# Patient Record
Sex: Female | Born: 1960 | Race: Black or African American | Hispanic: No | Smoking: Current every day smoker
Health system: Southern US, Community
[De-identification: ages and names within clinical notes are randomized; demographics above are authoritative.]

## PROBLEM LIST (undated history)

## (undated) DIAGNOSIS — F319 Bipolar disorder, unspecified: Secondary | ICD-10-CM

## (undated) DIAGNOSIS — E669 Obesity, unspecified: Secondary | ICD-10-CM

## (undated) DIAGNOSIS — M1990 Unspecified osteoarthritis, unspecified site: Secondary | ICD-10-CM

## (undated) DIAGNOSIS — F32A Depression, unspecified: Secondary | ICD-10-CM

## (undated) DIAGNOSIS — J309 Allergic rhinitis, unspecified: Secondary | ICD-10-CM

## (undated) DIAGNOSIS — F419 Anxiety disorder, unspecified: Secondary | ICD-10-CM

## (undated) DIAGNOSIS — E785 Hyperlipidemia, unspecified: Secondary | ICD-10-CM

## (undated) DIAGNOSIS — I10 Essential (primary) hypertension: Secondary | ICD-10-CM

## (undated) DIAGNOSIS — K219 Gastro-esophageal reflux disease without esophagitis: Secondary | ICD-10-CM

## (undated) DIAGNOSIS — D649 Anemia, unspecified: Secondary | ICD-10-CM

## (undated) DIAGNOSIS — F329 Major depressive disorder, single episode, unspecified: Secondary | ICD-10-CM

## (undated) HISTORY — DX: Allergic rhinitis, unspecified: J30.9

## (undated) HISTORY — DX: Unspecified osteoarthritis, unspecified site: M19.90

## (undated) HISTORY — DX: Anemia, unspecified: D64.9

## (undated) HISTORY — PX: Z-PLASTY REPAIR: SHX2678

## (undated) HISTORY — DX: Hyperlipidemia, unspecified: E78.5

## (undated) HISTORY — DX: Gastro-esophageal reflux disease without esophagitis: K21.9

## (undated) HISTORY — DX: Obesity, unspecified: E66.9

## (undated) HISTORY — PX: WRIST SURGERY: SHX841

## (undated) HISTORY — PX: ABDOMINAL HYSTERECTOMY: SUR658

---

## 2004-10-24 ENCOUNTER — Ambulatory Visit: Payer: Self-pay | Admitting: Family Medicine

## 2006-06-29 ENCOUNTER — Emergency Department (HOSPITAL_COMMUNITY): Admission: EM | Admit: 2006-06-29 | Discharge: 2006-06-29 | Payer: Self-pay | Admitting: Emergency Medicine

## 2007-05-22 ENCOUNTER — Ambulatory Visit (HOSPITAL_COMMUNITY): Admission: RE | Admit: 2007-05-22 | Discharge: 2007-05-22 | Payer: Self-pay | Admitting: Family Medicine

## 2007-10-01 ENCOUNTER — Encounter: Payer: Self-pay | Admitting: Family Medicine

## 2008-02-12 ENCOUNTER — Emergency Department (HOSPITAL_COMMUNITY): Admission: EM | Admit: 2008-02-12 | Discharge: 2008-02-12 | Payer: Self-pay | Admitting: Emergency Medicine

## 2009-02-12 ENCOUNTER — Emergency Department (HOSPITAL_COMMUNITY): Admission: EM | Admit: 2009-02-12 | Discharge: 2009-02-12 | Payer: Self-pay | Admitting: Emergency Medicine

## 2009-02-24 ENCOUNTER — Ambulatory Visit (HOSPITAL_COMMUNITY): Admission: RE | Admit: 2009-02-24 | Discharge: 2009-02-24 | Payer: Self-pay | Admitting: Emergency Medicine

## 2009-02-24 ENCOUNTER — Emergency Department (HOSPITAL_COMMUNITY): Admission: EM | Admit: 2009-02-24 | Discharge: 2009-02-24 | Payer: Self-pay | Admitting: Emergency Medicine

## 2009-03-20 ENCOUNTER — Emergency Department (HOSPITAL_COMMUNITY): Admission: EM | Admit: 2009-03-20 | Discharge: 2009-03-21 | Payer: Self-pay | Admitting: Emergency Medicine

## 2009-08-09 ENCOUNTER — Emergency Department (HOSPITAL_COMMUNITY): Admission: EM | Admit: 2009-08-09 | Discharge: 2009-08-10 | Payer: Self-pay | Admitting: Emergency Medicine

## 2009-08-10 ENCOUNTER — Ambulatory Visit: Payer: Self-pay | Admitting: Psychiatry

## 2009-08-10 ENCOUNTER — Inpatient Hospital Stay (HOSPITAL_COMMUNITY): Admission: AD | Admit: 2009-08-10 | Discharge: 2009-08-16 | Payer: Self-pay | Admitting: Psychiatry

## 2009-08-28 ENCOUNTER — Encounter (INDEPENDENT_AMBULATORY_CARE_PROVIDER_SITE_OTHER): Payer: Self-pay | Admitting: Emergency Medicine

## 2009-08-28 ENCOUNTER — Ambulatory Visit: Payer: Self-pay | Admitting: Vascular Surgery

## 2009-08-28 ENCOUNTER — Emergency Department (HOSPITAL_COMMUNITY): Admission: EM | Admit: 2009-08-28 | Discharge: 2009-08-28 | Payer: Self-pay | Admitting: Emergency Medicine

## 2009-09-19 ENCOUNTER — Emergency Department (HOSPITAL_COMMUNITY): Admission: EM | Admit: 2009-09-19 | Discharge: 2009-09-19 | Payer: Self-pay | Admitting: Emergency Medicine

## 2009-12-27 ENCOUNTER — Ambulatory Visit: Payer: Self-pay | Admitting: Internal Medicine

## 2009-12-27 ENCOUNTER — Encounter (INDEPENDENT_AMBULATORY_CARE_PROVIDER_SITE_OTHER): Payer: Self-pay | Admitting: Family Medicine

## 2009-12-27 LAB — CONVERTED CEMR LAB
Barbiturate Quant, Ur: NEGATIVE
Creatinine,U: 162.1 mg/dL
Marijuana Metabolite: NEGATIVE
Methadone: NEGATIVE
Opiate Screen, Urine: NEGATIVE
Phencyclidine (PCP): NEGATIVE
Propoxyphene: NEGATIVE

## 2010-02-19 ENCOUNTER — Ambulatory Visit: Payer: Self-pay | Admitting: Internal Medicine

## 2010-02-19 LAB — CONVERTED CEMR LAB
Alkaline Phosphatase: 45 units/L (ref 39–117)
Basophils Absolute: 0 10*3/uL (ref 0.0–0.1)
Basophils Relative: 1 % (ref 0–1)
CO2: 19 meq/L (ref 19–32)
Cholesterol: 230 mg/dL — ABNORMAL HIGH (ref 0–200)
Creatinine, Ser: 1.44 mg/dL — ABNORMAL HIGH (ref 0.40–1.20)
Eosinophils Absolute: 0.3 10*3/uL (ref 0.0–0.7)
Glucose, Bld: 107 mg/dL — ABNORMAL HIGH (ref 70–99)
MCHC: 34.8 g/dL (ref 30.0–36.0)
MCV: 79.8 fL (ref 78.0–100.0)
Monocytes Absolute: 0.6 10*3/uL (ref 0.1–1.0)
Monocytes Relative: 10 % (ref 3–12)
Neutrophils Relative %: 63 % (ref 43–77)
RBC: 4.71 M/uL (ref 3.87–5.11)
RDW: 14.5 % (ref 11.5–15.5)
Total Bilirubin: 0.5 mg/dL (ref 0.3–1.2)
Total CHOL/HDL Ratio: 4.8
Triglycerides: 130 mg/dL (ref <150)
VLDL: 26 mg/dL (ref 0–40)

## 2010-02-21 ENCOUNTER — Encounter (INDEPENDENT_AMBULATORY_CARE_PROVIDER_SITE_OTHER): Payer: Self-pay | Admitting: Internal Medicine

## 2010-10-30 NOTE — Letter (Signed)
Summary: rpc chart  rpc chart   Imported By: Curtis Sites 07/12/2010 15:53:43  _____________________________________________________________________  External Attachment:    Type:   Image     Comment:   External Document

## 2011-01-02 LAB — URINALYSIS, ROUTINE W REFLEX MICROSCOPIC
Glucose, UA: NEGATIVE mg/dL
Hgb urine dipstick: NEGATIVE
Protein, ur: NEGATIVE mg/dL

## 2011-01-02 LAB — CBC
HCT: 37.1 % (ref 36.0–46.0)
Hemoglobin: 12.6 g/dL (ref 12.0–15.0)
MCHC: 34.4 g/dL (ref 30.0–36.0)
MCV: 86.6 fL (ref 78.0–100.0)
Platelets: 230 10*3/uL (ref 150–400)
RBC: 4.28 MIL/uL (ref 3.87–5.11)
RDW: 14.6 % (ref 11.5–15.5)
WBC: 6 10*3/uL (ref 4.0–10.5)

## 2011-01-02 LAB — HEPATIC FUNCTION PANEL
AST: 21 U/L (ref 0–37)
Albumin: 3.3 g/dL — ABNORMAL LOW (ref 3.5–5.2)

## 2011-01-02 LAB — POCT I-STAT, CHEM 8
BUN: 12 mg/dL (ref 6–23)
Calcium, Ion: 1.24 mmol/L (ref 1.12–1.32)
Creatinine, Ser: 0.9 mg/dL (ref 0.4–1.2)
Glucose, Bld: 86 mg/dL (ref 70–99)
TCO2: 23 mmol/L (ref 0–100)

## 2011-01-02 LAB — RAPID URINE DRUG SCREEN, HOSP PERFORMED
Amphetamines: NOT DETECTED
Barbiturates: NOT DETECTED
Benzodiazepines: NOT DETECTED
Opiates: NOT DETECTED

## 2011-01-02 LAB — BASIC METABOLIC PANEL
CO2: 21 mEq/L (ref 19–32)
Calcium: 9.6 mg/dL (ref 8.4–10.5)
Creatinine, Ser: 1.11 mg/dL (ref 0.4–1.2)
GFR calc Af Amer: >60 mL/min (ref >60)
Glucose, Bld: 105 mg/dL — ABNORMAL HIGH (ref 70–99)

## 2011-01-02 LAB — DIFFERENTIAL
Basophils Absolute: 0 10*3/uL (ref 0.0–0.1)
Basophils Relative: 1 % (ref 0–1)
Eosinophils Absolute: 0.4 10*3/uL (ref 0.0–0.7)
Eosinophils Relative: 6 % — ABNORMAL HIGH (ref 0–5)
Lymphs Abs: 2.2 10*3/uL (ref 0.7–4.0)
Monocytes Absolute: 0.7 10*3/uL (ref 0.1–1.0)
Monocytes Relative: 11 % (ref 3–12)
Neutro Abs: 2.8 10*3/uL (ref 1.7–7.7)
Neutrophils Relative %: 47 % (ref 43–77)
Neutrophils Relative %: 48 % (ref 43–77)

## 2011-01-07 LAB — BASIC METABOLIC PANEL
CO2: 23 mEq/L (ref 19–32)
GFR calc non Af Amer: 43 mL/min — ABNORMAL LOW (ref >60)
Glucose, Bld: 107 mg/dL — ABNORMAL HIGH (ref 70–99)
Potassium: 3.3 mEq/L — ABNORMAL LOW (ref 3.5–5.1)
Sodium: 139 mEq/L (ref 135–145)

## 2011-01-07 LAB — DIFFERENTIAL
Basophils Absolute: 0 K/uL (ref 0.0–0.1)
Basophils Relative: 0 % (ref 0–1)
Eosinophils Absolute: 0.1 K/uL (ref 0.0–0.7)
Eosinophils Relative: 2 % (ref 0–5)
Lymphocytes Relative: 12 % (ref 12–46)
Lymphs Abs: 0.7 K/uL (ref 0.7–4.0)
Monocytes Absolute: 0.4 K/uL (ref 0.1–1.0)
Monocytes Relative: 6 % (ref 3–12)
Neutro Abs: 4.7 K/uL (ref 1.7–7.7)
Neutrophils Relative %: 79 % — ABNORMAL HIGH (ref 43–77)

## 2011-01-07 LAB — URINALYSIS, ROUTINE W REFLEX MICROSCOPIC
Bilirubin Urine: NEGATIVE
Glucose, UA: NEGATIVE mg/dL
Hgb urine dipstick: NEGATIVE
Specific Gravity, Urine: 1.015 (ref 1.005–1.030)
Urobilinogen, UA: 0.2 mg/dL (ref 0.0–1.0)

## 2011-01-07 LAB — CBC
HCT: 33.9 % — ABNORMAL LOW (ref 36.0–46.0)
Hemoglobin: 12 g/dL (ref 12.0–15.0)
MCHC: 35.3 g/dL (ref 30.0–36.0)
RBC: 3.98 MIL/uL (ref 3.87–5.11)
RDW: 14.3 % (ref 11.5–15.5)

## 2011-01-07 LAB — PREGNANCY, URINE: Preg Test, Ur: NEGATIVE

## 2011-01-08 LAB — CBC
HCT: 34.2 % — ABNORMAL LOW (ref 36.0–46.0)
HCT: 34.8 % — ABNORMAL LOW (ref 36.0–46.0)
Hemoglobin: 12.4 g/dL (ref 12.0–15.0)
Hemoglobin: 12.4 g/dL (ref 12.0–15.0)
MCHC: 36.1 g/dL — ABNORMAL HIGH (ref 30.0–36.0)
MCV: 84.2 fL (ref 78.0–100.0)
Platelets: 206 10*3/uL (ref 150–400)
RBC: 4.07 MIL/uL (ref 3.87–5.11)
WBC: 5.9 10*3/uL (ref 4.0–10.5)

## 2011-01-08 LAB — BASIC METABOLIC PANEL
CO2: 21 mEq/L (ref 19–32)
CO2: 22 mEq/L (ref 19–32)
Chloride: 112 mEq/L (ref 96–112)
Chloride: 112 mEq/L (ref 96–112)
Creatinine, Ser: 1.26 mg/dL — ABNORMAL HIGH (ref 0.4–1.2)
GFR calc Af Amer: 53 mL/min — ABNORMAL LOW (ref >60)
GFR calc Af Amer: 55 mL/min — ABNORMAL LOW (ref >60)
Potassium: 3.6 mEq/L (ref 3.5–5.1)
Potassium: 3.7 mEq/L (ref 3.5–5.1)
Sodium: 138 mEq/L (ref 135–145)

## 2011-01-08 LAB — URINALYSIS, ROUTINE W REFLEX MICROSCOPIC
Glucose, UA: NEGATIVE mg/dL
Hgb urine dipstick: NEGATIVE
Specific Gravity, Urine: 1.025 (ref 1.005–1.030)
pH: 6 (ref 5.0–8.0)

## 2011-01-08 LAB — DIFFERENTIAL
Basophils Relative: 1 % (ref 0–1)
Eosinophils Absolute: 0.3 10*3/uL (ref 0.0–0.7)
Eosinophils Relative: 4 % (ref 0–5)
Eosinophils Relative: 6 % — ABNORMAL HIGH (ref 0–5)
Lymphocytes Relative: 28 % (ref 12–46)
Lymphs Abs: 1.7 10*3/uL (ref 0.7–4.0)
Lymphs Abs: 1.7 10*3/uL (ref 0.7–4.0)
Monocytes Absolute: 0.7 10*3/uL (ref 0.1–1.0)
Monocytes Relative: 11 % (ref 3–12)
Neutro Abs: 3.3 10*3/uL (ref 1.7–7.7)
Neutrophils Relative %: 58 % (ref 43–77)

## 2011-01-26 ENCOUNTER — Emergency Department (HOSPITAL_COMMUNITY): Payer: Self-pay

## 2011-01-26 ENCOUNTER — Emergency Department (HOSPITAL_COMMUNITY)
Admission: EM | Admit: 2011-01-26 | Discharge: 2011-01-27 | Disposition: A | Discharge status: Home / Self Care | Payer: Self-pay | Attending: Emergency Medicine | Admitting: Emergency Medicine

## 2011-01-26 DIAGNOSIS — R51 Headache: Secondary | ICD-10-CM | POA: Insufficient documentation

## 2011-01-26 DIAGNOSIS — R197 Diarrhea, unspecified: Secondary | ICD-10-CM | POA: Insufficient documentation

## 2011-01-26 DIAGNOSIS — R109 Unspecified abdominal pain: Secondary | ICD-10-CM | POA: Insufficient documentation

## 2011-01-26 DIAGNOSIS — R112 Nausea with vomiting, unspecified: Secondary | ICD-10-CM | POA: Insufficient documentation

## 2011-01-26 LAB — URINE MICROSCOPIC-ADD ON

## 2011-01-26 LAB — POCT I-STAT, CHEM 8
BUN: 10 mg/dL (ref 6–23)
Calcium, Ion: 1.22 mmol/L (ref 1.12–1.32)
Chloride: 104 mEq/L (ref 96–112)
HCT: 44 % (ref 36.0–46.0)
Sodium: 139 mEq/L (ref 135–145)

## 2011-01-26 LAB — URINALYSIS, ROUTINE W REFLEX MICROSCOPIC
Bilirubin Urine: NEGATIVE
Glucose, UA: NEGATIVE mg/dL
Hgb urine dipstick: NEGATIVE
Ketones, ur: NEGATIVE mg/dL
Protein, ur: 30 mg/dL — AB
pH: 8 (ref 5.0–8.0)

## 2011-07-31 ENCOUNTER — Inpatient Hospital Stay (INDEPENDENT_AMBULATORY_CARE_PROVIDER_SITE_OTHER)
Admission: RE | Admit: 2011-07-31 | Discharge: 2011-07-31 | Disposition: A | Discharge status: Home / Self Care | Payer: Self-pay | Source: Ambulatory Visit | Attending: Family Medicine | Admitting: Family Medicine

## 2011-07-31 DIAGNOSIS — M545 Low back pain: Secondary | ICD-10-CM

## 2011-10-31 ENCOUNTER — Emergency Department (HOSPITAL_COMMUNITY): Payer: Self-pay

## 2011-10-31 ENCOUNTER — Other Ambulatory Visit: Payer: Self-pay

## 2011-10-31 ENCOUNTER — Emergency Department (HOSPITAL_COMMUNITY)
Admission: EM | Admit: 2011-10-31 | Discharge: 2011-10-31 | Disposition: A | Discharge status: Home / Self Care | Payer: Self-pay | Attending: Emergency Medicine | Admitting: Emergency Medicine

## 2011-10-31 ENCOUNTER — Encounter (HOSPITAL_COMMUNITY): Payer: Self-pay

## 2011-10-31 DIAGNOSIS — M7989 Other specified soft tissue disorders: Secondary | ICD-10-CM | POA: Insufficient documentation

## 2011-10-31 DIAGNOSIS — F341 Dysthymic disorder: Secondary | ICD-10-CM | POA: Insufficient documentation

## 2011-10-31 DIAGNOSIS — F17200 Nicotine dependence, unspecified, uncomplicated: Secondary | ICD-10-CM | POA: Insufficient documentation

## 2011-10-31 DIAGNOSIS — Z79899 Other long term (current) drug therapy: Secondary | ICD-10-CM | POA: Insufficient documentation

## 2011-10-31 DIAGNOSIS — F319 Bipolar disorder, unspecified: Secondary | ICD-10-CM | POA: Insufficient documentation

## 2011-10-31 DIAGNOSIS — R072 Precordial pain: Secondary | ICD-10-CM | POA: Insufficient documentation

## 2011-10-31 HISTORY — DX: Essential (primary) hypertension: I10

## 2011-10-31 HISTORY — DX: Anxiety disorder, unspecified: F41.9

## 2011-10-31 HISTORY — DX: Bipolar disorder, unspecified: F31.9

## 2011-10-31 HISTORY — DX: Depression, unspecified: F32.A

## 2011-10-31 HISTORY — DX: Major depressive disorder, single episode, unspecified: F32.9

## 2011-10-31 LAB — URINALYSIS, ROUTINE W REFLEX MICROSCOPIC
Glucose, UA: NEGATIVE mg/dL
Ketones, ur: NEGATIVE mg/dL
Leukocytes, UA: NEGATIVE
Nitrite: NEGATIVE
Protein, ur: NEGATIVE mg/dL
pH: 6.5 (ref 5.0–8.0)

## 2011-10-31 LAB — DIFFERENTIAL
Basophils Absolute: 0 10*3/uL (ref 0.0–0.1)
Basophils Relative: 0 % (ref 0–1)
Lymphocytes Relative: 29 % (ref 12–46)
Neutro Abs: 5 10*3/uL (ref 1.7–7.7)
Neutrophils Relative %: 61 % (ref 43–77)

## 2011-10-31 LAB — CBC
HCT: 37.7 % (ref 36.0–46.0)
Hemoglobin: 13.4 g/dL (ref 12.0–15.0)
MCHC: 35.5 g/dL (ref 30.0–36.0)
RDW: 14.8 % (ref 11.5–15.5)
WBC: 8.2 10*3/uL (ref 4.0–10.5)

## 2011-10-31 LAB — POCT I-STAT TROPONIN I: Troponin i, poc: 0 ng/mL (ref 0.00–0.08)

## 2011-10-31 LAB — POCT I-STAT, CHEM 8
Calcium, Ion: 1.29 mmol/L (ref 1.12–1.32)
HCT: 42 % (ref 36.0–46.0)
Hemoglobin: 14.3 g/dL (ref 12.0–15.0)
TCO2: 23 mmol/L (ref 0–100)

## 2011-10-31 LAB — GLUCOSE, CAPILLARY: Glucose-Capillary: 95 mg/dL (ref 70–99)

## 2011-10-31 LAB — POCT PREGNANCY, URINE: Preg Test, Ur: NEGATIVE

## 2011-10-31 NOTE — ED Provider Notes (Addendum)
12:23 PM  Medical screening exam   Patient sent from health service for persistent substernal chest pressure that has been worsening over the past month. She is also having bilateral lower extremity edema for several months. Patient states her blood pressure was in the range of 160/105 at health service, but it did not start her in a blood pressure medications. Patient is also having urinary difficulties. She denies any previous admissions for chest pain.   Initial orders done from triage. Patient will go to the back for further evaluation.  Adelis Docter A. Patrica Duel, MD 10/31/11 1224    Date: 10/31/2011  Rate: 99  Rhythm: normal sinus rhythm  QRS Axis: normal  Intervals: normal  ST/T Wave abnormalities: nonspecific T wave changes  Conduction Disutrbances:none  Narrative Interpretation:   Old EKG Reviewed: changes noted    Jaynell Castagnola A. Patrica Duel, MD 10/31/11 1235

## 2011-10-31 NOTE — ED Notes (Addendum)
Pt reports to this RN that she has recently been experiencing burning and squeezing chest pain which usually lasts several hours but is intermittent. Pt denies any correlation with eating. Patient placed on cardiac monitor. VSS. Cardiac assessment WNL. Denies pain at present.

## 2011-10-31 NOTE — ED Notes (Signed)
Pt went for [facility] and was told that her blood pressure was elevated. She stated that she has also been having substernal chest pressure that has been getting worse over the past month. She also complains of bilateral leg swelling and frequent urination. Pt was sent over for further evaluation.

## 2011-10-31 NOTE — ED Provider Notes (Signed)
History     CSN: [id number]  Arrival date & time 10/31/11  1150   First MD Initiated Contact with Patient 10/31/11 1328      Chief Complaint  Patient presents with  . Chest Pain  . Hypertension    (Consider location/radiation/quality/duration/timing/severity/associated sxs/prior treatment) HPI Comments: 51 y.o female with hx of HTN and anxiety, no previous h/o CAD -- presents to ED with chest pain after seeing her PCP today.  Reports persistent substernal chest pain for the last month and bilateral leg swelling over the last year. Chest pain is described as burning, localized, and tight.  Pain will last several hours and occurs daily.  She experiences nausea and epigastric discomfort with associated chest pain. She denies SOB, diaphoresis, numbness/weakness in extremities, jaw pain, shoulder pain, worsening exercise intolerance, headaches, and vomiting.  Pain is not brought on by exertion.  Denies recent illness, recent travel, oral contraception, pregnancy, and lower extremity injury.  Family hx significant for sister with AMI at age 76. Pt is a smoker.   Patient is a 51 y.o. female presenting with chest pain and hypertension. The history is provided by the patient.  Chest Pain The chest pain began more than 2 weeks ago. Chest pain occurs frequently. The chest pain is unchanged. The quality of the pain is described as burning. The pain does not radiate. Pertinent negatives for primary symptoms include no fever, no fatigue, no shortness of breath, no cough, no palpitations, no abdominal pain, no nausea, no vomiting and no dizziness.  Pertinent negatives for associated symptoms include no diaphoresis. She tried nothing for the symptoms. Risk factors include smoking/tobacco exposure.  Her past medical history is significant for hypertension.  Her family medical history is significant for early MI in family.  Procedure history is negative for cardiac catheterization.     Hypertension Associated symptoms include chest pain. Pertinent negatives include no abdominal pain, coughing, diaphoresis, fatigue, fever, headaches, nausea, neck pain, rash or vomiting.    Past Medical History  Diagnosis Date  . Anxiety   . Depression   . Bipolar disorder   . Hypertension     Past Surgical History  Procedure Date  . Abdominal hysterectomy     History reviewed. No pertinent family history.  History  Substance Use Topics  . Smoking status: Current Everyday Smoker -- 0.5 packs/day  . Smokeless tobacco: Not on file  . Alcohol Use: No    OB History    Grav Para Term Preterm Abortions TAB SAB Ect Mult Living                  Review of Systems  Constitutional: Negative for fever, diaphoresis and fatigue.  HENT: Negative for neck pain.   Eyes: Negative for redness.  Respiratory: Negative for cough and shortness of breath.   Cardiovascular: Positive for chest pain and leg swelling. Negative for palpitations.  Gastrointestinal: Negative for nausea, vomiting, abdominal pain, diarrhea, constipation and blood in stool.  Genitourinary: Negative for dysuria.  Musculoskeletal: Negative for back pain.  Skin: Negative for rash.  Neurological: Negative for dizziness, syncope, light-headedness and headaches.    Allergies  Review of patient's allergies indicates no known allergies.  Home Medications   Current Outpatient Rx  Name Route Sig Dispense Refill  . ALPRAZOLAM 1 MG PO TABS Oral Take 1 mg by mouth 4 (four) times daily as needed. For anxiety    . GOODY HEADACHE PO Oral Take 1 packet by mouth daily as needed. For headache pain    .  DULOXETINE HCL 60 MG PO CPEP Oral Take 60 mg by mouth daily.    . QUETIAPINE FUMARATE 300 MG PO TABS Oral Take 900 mg by mouth at bedtime.    . TOPIRAMATE 200 MG PO TABS Oral Take 200 mg by mouth daily.      BP 137/94  Pulse 78  Temp(Src) 97.8 F (36.6 C) (Oral)  Resp 13  SpO2 97%  Physical Exam  Nursing note and  vitals reviewed. Constitutional: She is oriented to person, place, and time. She appears well-developed and well-nourished.  HENT:  Head: Normocephalic and atraumatic.  Mouth/Throat: Mucous membranes are normal. Mucous membranes are not dry.  Eyes: Conjunctivae are normal.  Neck: Trachea normal and normal range of motion. Neck supple. Normal carotid pulses and no JVD present. No muscular tenderness present. Carotid bruit is not present. No tracheal deviation present.  Cardiovascular: Normal rate, regular rhythm, S1 normal, S2 normal, normal heart sounds and intact distal pulses.  Exam reveals no decreased pulses.   No murmur heard. Pulmonary/Chest: Effort normal and breath sounds normal. No respiratory distress. She has no wheezes. She exhibits no tenderness.  Abdominal: Soft. Normal aorta and bowel sounds are normal. There is no tenderness. There is no rebound and no guarding.  Musculoskeletal: Normal range of motion.  Neurological: She is alert and oriented to person, place, and time.  Skin: Skin is warm and dry. She is not diaphoretic. No cyanosis. No pallor.  Psychiatric: She has a normal mood and affect.    ED Course  Procedures (including critical care time)  Labs Reviewed  CBC - Abnormal; Notable for the following:    MCV 77.9 (*)    All other components within normal limits  POCT I-STAT, CHEM 8 - Abnormal; Notable for the following:    Glucose, Bld 101 (*)    All other components within normal limits  URINALYSIS, ROUTINE W REFLEX MICROSCOPIC  DIFFERENTIAL  GLUCOSE, CAPILLARY  POCT PREGNANCY, URINE  POCT I-STAT TROPONIN I   Dg Chest 2 View  10/31/2011  *RADIOLOGY REPORT*  Clinical Data: Chest pain and hypertension  CHEST - 2 VIEW  Comparison: 08/28/2009  Findings: The heart size and mediastinal contours are within normal limits.  Both lungs are clear.  The visualized skeletal structures are unremarkable.  IMPRESSION: No active cardiopulmonary abnormalities.  Original Report  Authenticated By: Rosealee Albee, M.D.     1. Chest pain    Patient was seen and examined. Results/EKG reviewed. Seen and discussed with Dr. Linwood Dibbles. I called [facility] in an attempt to schedule out-pt stress test. I was told by a staff member at [facility] that the patient should expect a call to have this scheduled.   Patient informed of results. She voices a desire to go home. Will discharge to home.  Patient told to follow-up with PCP in next week.  Informed of plan with [facility]. Patient told to return to ED with persistent CP associated with exertion, sweating, racing heart, palpitations, shortness of breath, lightheadedness, radiation of pain into jaw, neck, or arms.  Patient verbalizes understanding and agrees with plan.     MDM  Patient with chest pain.  HTN improved in ED.  Doubt cardiac origin of pain given: neg cardiac enzymes with pain > 12 hours, unchanged EKG, pain everyday for last month, no exertional component, lack of accompanying sx including palps, sweating, SOB, radiation. No risk factors for PE. VSS. Patient appears well.   Eustace Moore [place], [place] 10/31/11 1554

## 2011-10-31 NOTE — ED Provider Notes (Signed)
Medical screening examination/treatment/procedure(s) were conducted as a shared visit with non-physician practitioner(s) and myself.  I personally evaluated the patient during the encounter  Patient presents with complaints of chest pain for the last month. She was seen at her doctor's office and was sent to ED for evaluation. Patient states she's been having some chest discomfort that was burning.  Patient has also noted some swelling in her legs. There is no exertional component. Patient otherwise feels fine at this time. Initial ED evaluation shows EKG is unchanged from her baseline. Her laboratory workup is normal as well. the patient however does have history of HTN, smoking and her sister has had CAD.  Feel the patient would be appropriate for close outpatient followup for possible stress testing..  I'll contact the cardiologist on call.  Celene Kras, MD 10/31/11 [phone]

## 2011-10-31 NOTE — ED Notes (Signed)
Patient resting comfortably on stretcher denies chest pain at this time.

## 2011-11-04 ENCOUNTER — Other Ambulatory Visit (HOSPITAL_COMMUNITY): Payer: Self-pay | Admitting: Cardiovascular Disease

## 2011-11-13 ENCOUNTER — Other Ambulatory Visit (HOSPITAL_COMMUNITY): Payer: Self-pay

## 2011-12-20 ENCOUNTER — Other Ambulatory Visit: Payer: Self-pay | Admitting: Obstetrics and Gynecology

## 2011-12-20 DIAGNOSIS — Z1231 Encounter for screening mammogram for malignant neoplasm of breast: Secondary | ICD-10-CM

## 2011-12-31 ENCOUNTER — Ambulatory Visit (INDEPENDENT_AMBULATORY_CARE_PROVIDER_SITE_OTHER): Payer: Self-pay | Admitting: *Deleted

## 2011-12-31 ENCOUNTER — Ambulatory Visit (HOSPITAL_COMMUNITY)
Admission: RE | Admit: 2011-12-31 | Discharge: 2011-12-31 | Disposition: A | Discharge status: Home / Self Care | Payer: No Typology Code available for payment source | Source: Ambulatory Visit | Attending: Obstetrics and Gynecology | Admitting: Obstetrics and Gynecology

## 2011-12-31 VITALS — BP 123/88 | HR 82 | Temp 98.4°F | Ht 63.0 in | Wt 194.5 lb

## 2011-12-31 DIAGNOSIS — Z1231 Encounter for screening mammogram for malignant neoplasm of breast: Secondary | ICD-10-CM

## 2011-12-31 DIAGNOSIS — Z01419 Encounter for gynecological examination (general) (routine) without abnormal findings: Secondary | ICD-10-CM

## 2011-12-31 NOTE — Patient Instructions (Addendum)
Taught patient how to perform BSE. Let patient know if today's Pap smear is normal she will not need any further Pap smears due to history of hysterectomy for benign reasons. Patient is escorted to mammography for screening mammogram. Let patient know will follow up with her within the next couple weeks with results. Talked with patient about smoking cessation. Patient given resources and information about free smoking cessation program at the [facility] if interested. Patient referred to the [facility] for bilateral ovarian pain. Appointment scheduled for January 23, 2012 at 1300. Patient aware of appointment and will be there. Patient verbalized understanding.

## 2011-12-31 NOTE — Progress Notes (Addendum)
Complaints of skin lesion on left breast that has gotten larger.  Pap Smear:    Completed Pap smear today. Per patient last Pap smear was 4 years ago and normal. Per patient no history of abnormal Pap smears. Patient has a history of a partial hysterectomy around 17 years due to heavy bleeding. Let patient know she will not need any further Pap smears if today's Pap smear is normal. No Pap smear results in EPIC.  Physical exam: Breasts Breasts symmetrical. No skin abnormalities right breast. Skin lesion on left breast around 9 o'clock that per patient has grown in size. The area is on the skin that looks like a boil. Referred patient to the free skin cancer screening at the [facility] in May to have checked. Gave her phone number to call to register. No nipple retraction bilateral breasts. No nipple discharge bilateral breasts. No lymphadenopathy. No lumps palpated bilateral breasts. No complaints of pain or tenderness on exam.         Pelvic/Bimanual   Ext Genitalia No lesions, no swelling and no discharge observed on external genitalia.         Vagina Vagina pink and normal texture. No lesions or discharge observed in vagina.          Cervix Cervix is absent due to history of partial hysterectomy.         Uterus Uterus is absent due to history of partial hysterectomy.     Adnexae Bilateral ovaries present and palpable. Patient complains of bilateral ovarian pain. Patient referred to the [facility] for follow up. Appointment scheduled January 23, 2012 at 1300.        Rectovaginal No rectal exam completed today since patient had no rectal complaints. No skin abnormalities observed on exam.

## 2011-12-31 NOTE — Progress Notes (Signed)
Pt states has spot on left breast that is getting larger.

## 2012-01-06 ENCOUNTER — Other Ambulatory Visit: Payer: Self-pay | Admitting: Obstetrics and Gynecology

## 2012-01-06 MED ORDER — METRONIDAZOLE 500 MG PO TABS: 500.0000 mg | ORAL_TABLET | Freq: Two times a day (BID) | ORAL | Status: AC

## 2012-01-08 ENCOUNTER — Telehealth: Payer: Self-pay | Admitting: *Deleted

## 2012-01-08 NOTE — Telephone Encounter (Signed)
Called patient and gave her result to Pap smear. Let patient know her Pap smear was normal and showed the Trichomonas infection. Explained that Trichomonas is a STD.  Let patient know have sent a prescription for Flagyl to her pharmacy. Let her know she will need to avoid alcohol while taking Flagyl and that her partner will need to be treated. Patient verbalized understanding.

## 2012-01-23 ENCOUNTER — Other Ambulatory Visit: Payer: Self-pay | Admitting: Advanced Practice Midwife

## 2012-01-23 ENCOUNTER — Encounter: Payer: Self-pay | Admitting: Advanced Practice Midwife

## 2012-01-23 ENCOUNTER — Ambulatory Visit (INDEPENDENT_AMBULATORY_CARE_PROVIDER_SITE_OTHER): Payer: Self-pay | Admitting: Advanced Practice Midwife

## 2012-01-23 VITALS — BP 129/88 | HR 86 | Temp 97.5°F | Ht 62.0 in | Wt 194.9 lb

## 2012-01-23 DIAGNOSIS — F319 Bipolar disorder, unspecified: Secondary | ICD-10-CM

## 2012-01-23 DIAGNOSIS — F39 Unspecified mood [affective] disorder: Secondary | ICD-10-CM | POA: Insufficient documentation

## 2012-01-23 DIAGNOSIS — I10 Essential (primary) hypertension: Secondary | ICD-10-CM | POA: Insufficient documentation

## 2012-01-23 DIAGNOSIS — F419 Anxiety disorder, unspecified: Secondary | ICD-10-CM | POA: Insufficient documentation

## 2012-01-23 DIAGNOSIS — R350 Frequency of micturition: Secondary | ICD-10-CM

## 2012-01-23 DIAGNOSIS — M1990 Unspecified osteoarthritis, unspecified site: Secondary | ICD-10-CM | POA: Insufficient documentation

## 2012-01-23 DIAGNOSIS — R1030 Lower abdominal pain, unspecified: Secondary | ICD-10-CM

## 2012-01-23 DIAGNOSIS — F329 Major depressive disorder, single episode, unspecified: Secondary | ICD-10-CM

## 2012-01-23 DIAGNOSIS — R109 Unspecified abdominal pain: Secondary | ICD-10-CM

## 2012-01-23 DIAGNOSIS — F411 Generalized anxiety disorder: Secondary | ICD-10-CM

## 2012-01-23 DIAGNOSIS — M129 Arthropathy, unspecified: Secondary | ICD-10-CM

## 2012-01-23 LAB — POCT URINALYSIS DIP (DEVICE)
Glucose, UA: NEGATIVE mg/dL
Hgb urine dipstick: NEGATIVE
Nitrite: NEGATIVE
Urobilinogen, UA: 0.2 mg/dL (ref 0.0–1.0)

## 2012-01-23 MED ORDER — IBUPROFEN 600 MG PO TABS: 600.0000 mg | ORAL_TABLET | Freq: Four times a day (QID) | ORAL | Status: AC | PRN

## 2012-01-23 NOTE — Progress Notes (Signed)
  Subjective:    Patient ID: Angel Hughes, female    DOB: [birth year], 51 y.o.   MRN: [id number]  HPI Here for:  1. F/U after Dx Ivery Quale on Pap  -Pap otherwise normal. Took Flagyl for Trich ~01/04/12. Not sexually active "for many years".  2. Intermittent low abd cramping x several years   -States low abd pain feels like pain in ovaries before periods, but more painful and w/ no pattern. 6/10 on pain scale. Used to take unknown pain meds, none now.  Is S/P abd hysterectomy. No change in bowel habits. BM Q 2-3 days. 3. New onset of urinary frequency.  -Denies dysuria, urgency, hematuria fever, chills, flank pain.   Review of Systems: Pertinent findings in HPI.    Objective:    Physical Exam  Nursing note and vitals reviewed. Constitutional: She is oriented to person, place, and time. She appears well-developed and well-nourished. No distress.  Cardiovascular: Normal rate.   Pulmonary/Chest: Effort normal.  Abdominal: Soft. She exhibits no distension. There is no tenderness.       Extensive scarring on low abd  Genitourinary: There is no lesion on the right labia. There is no lesion on the left labia. Cervix exhibits no motion tenderness, no discharge and no friability. Right adnexum displays tenderness (mild). Right adnexum displays no mass and no fullness. Left adnexum displays tenderness (mild). Left adnexum displays no mass and no fullness. No erythema, tenderness or bleeding around the vagina. Vaginal discharge (moderate amount of thin, white, odorless discharge) found.       Uterus surgically absent  Lymphadenopathy:       Right: No inguinal adenopathy present.       Left: No inguinal adenopathy present.  Neurological: She is alert and oriented to person, place, and time.  Skin: Skin is warm and dry.  Psychiatric: She has a normal mood and affect.   BP 129/88  Pulse 86  Temp 97.5 F (36.4 C)  Ht 5\' 2"  (1.575 m)  Wt 194 lb 14.4 oz (88.406 kg)  BMI 35.65 kg/m2     Assessment &  Plan:   1. Lower abdominal pain  Urine Culture, US Transvaginal Non-OB, Wet prep, genital, GC/chlamydia probe amp, genital, ibuprofen (ADVIL,MOTRIN) 600 MG tablet  2. Frequency of urination  Urine Culture   Return in about 3 weeks (around 02/13/2012).  Dorathy Kinsman 01/23/2012 4:35 PM

## 2012-01-24 LAB — GC/CHLAMYDIA PROBE AMP, GENITAL: GC Probe Amp, Genital: NEGATIVE

## 2012-01-24 LAB — WET PREP, GENITAL

## 2012-01-28 ENCOUNTER — Ambulatory Visit (HOSPITAL_COMMUNITY)
Admission: RE | Admit: 2012-01-28 | Discharge: 2012-01-28 | Disposition: A | Discharge status: Home / Self Care | Payer: Self-pay | Source: Ambulatory Visit | Attending: Advanced Practice Midwife | Admitting: Advanced Practice Midwife

## 2012-01-28 DIAGNOSIS — R109 Unspecified abdominal pain: Secondary | ICD-10-CM | POA: Insufficient documentation

## 2012-01-28 DIAGNOSIS — Z90710 Acquired absence of both cervix and uterus: Secondary | ICD-10-CM | POA: Insufficient documentation

## 2012-01-28 DIAGNOSIS — R1030 Lower abdominal pain, unspecified: Secondary | ICD-10-CM

## 2012-01-28 DIAGNOSIS — N949 Unspecified condition associated with female genital organs and menstrual cycle: Secondary | ICD-10-CM | POA: Insufficient documentation

## 2012-02-21 ENCOUNTER — Ambulatory Visit: Payer: Self-pay | Admitting: Physician Assistant

## 2012-03-23 ENCOUNTER — Telehealth: Payer: Self-pay | Admitting: *Deleted

## 2012-03-23 NOTE — Telephone Encounter (Signed)
Angel Hughes called and left a message calling for results of Ultrasound

## 2012-03-24 NOTE — Telephone Encounter (Addendum)
Called pt and pt wanted to know [place] results from 12/2011 and I informed pt that that there are no abnormals.  Pt stated understanding and had no further questions.

## 2012-11-27 ENCOUNTER — Emergency Department (HOSPITAL_COMMUNITY)
Admission: EM | Admit: 2012-11-27 | Discharge: 2012-11-27 | Disposition: A | Discharge status: Home / Self Care | Payer: No Typology Code available for payment source | Attending: Emergency Medicine | Admitting: Emergency Medicine

## 2012-11-27 ENCOUNTER — Encounter (HOSPITAL_COMMUNITY): Payer: Self-pay | Admitting: Emergency Medicine

## 2012-11-27 DIAGNOSIS — F319 Bipolar disorder, unspecified: Secondary | ICD-10-CM | POA: Insufficient documentation

## 2012-11-27 DIAGNOSIS — I10 Essential (primary) hypertension: Secondary | ICD-10-CM | POA: Insufficient documentation

## 2012-11-27 DIAGNOSIS — S39012A Strain of muscle, fascia and tendon of lower back, initial encounter: Secondary | ICD-10-CM

## 2012-11-27 DIAGNOSIS — Y929 Unspecified place or not applicable: Secondary | ICD-10-CM | POA: Insufficient documentation

## 2012-11-27 DIAGNOSIS — M549 Dorsalgia, unspecified: Secondary | ICD-10-CM

## 2012-11-27 DIAGNOSIS — Z79899 Other long term (current) drug therapy: Secondary | ICD-10-CM | POA: Insufficient documentation

## 2012-11-27 DIAGNOSIS — M545 Low back pain, unspecified: Secondary | ICD-10-CM | POA: Insufficient documentation

## 2012-11-27 DIAGNOSIS — Z90710 Acquired absence of both cervix and uterus: Secondary | ICD-10-CM | POA: Insufficient documentation

## 2012-11-27 DIAGNOSIS — X58XXXA Exposure to other specified factors, initial encounter: Secondary | ICD-10-CM | POA: Insufficient documentation

## 2012-11-27 DIAGNOSIS — R109 Unspecified abdominal pain: Secondary | ICD-10-CM | POA: Insufficient documentation

## 2012-11-27 DIAGNOSIS — Y939 Activity, unspecified: Secondary | ICD-10-CM | POA: Insufficient documentation

## 2012-11-27 DIAGNOSIS — S335XXA Sprain of ligaments of lumbar spine, initial encounter: Secondary | ICD-10-CM | POA: Insufficient documentation

## 2012-11-27 DIAGNOSIS — F411 Generalized anxiety disorder: Secondary | ICD-10-CM | POA: Insufficient documentation

## 2012-11-27 DIAGNOSIS — F17200 Nicotine dependence, unspecified, uncomplicated: Secondary | ICD-10-CM | POA: Insufficient documentation

## 2012-11-27 DIAGNOSIS — Z8739 Personal history of other diseases of the musculoskeletal system and connective tissue: Secondary | ICD-10-CM | POA: Insufficient documentation

## 2012-11-27 LAB — POCT I-STAT, CHEM 8
Calcium, Ion: 1.36 mmol/L — ABNORMAL HIGH (ref 1.12–1.23)
Creatinine, Ser: 1 mg/dL (ref 0.50–1.10)
Glucose, Bld: 102 mg/dL — ABNORMAL HIGH (ref 70–99)
Hemoglobin: 12.9 g/dL (ref 12.0–15.0)
Sodium: 143 mEq/L (ref 135–145)
TCO2: 22 mmol/L (ref 0–100)

## 2012-11-27 LAB — URINALYSIS, ROUTINE W REFLEX MICROSCOPIC
Hgb urine dipstick: NEGATIVE
Protein, ur: NEGATIVE mg/dL
Urobilinogen, UA: 1 mg/dL (ref 0.0–1.0)

## 2012-11-27 MED ORDER — HYDROCODONE-ACETAMINOPHEN 5-325 MG PO TABS: 1.0000 | ORAL_TABLET | ORAL | Status: DC | PRN

## 2012-11-27 MED ORDER — HYDROCODONE-ACETAMINOPHEN 5-325 MG PO TABS
1.0000 | ORAL_TABLET | Freq: Once | ORAL | Status: AC
  Administered 2012-11-27: 1 via ORAL
  Filled 2012-11-27: qty 1

## 2012-11-27 MED ORDER — METHOCARBAMOL 500 MG PO TABS: 500.0000 mg | ORAL_TABLET | Freq: Two times a day (BID) | ORAL | Status: DC

## 2012-11-27 MED ORDER — KETOROLAC TROMETHAMINE 60 MG/2ML IM SOLN
30.0000 mg | Freq: Once | INTRAMUSCULAR | Status: AC
  Administered 2012-11-27: 30 mg via INTRAMUSCULAR
  Filled 2012-11-27: qty 2

## 2012-11-27 NOTE — ED Notes (Addendum)
PT. REPORTS PROGRESSING RIGHT LOWER BACK PAIN FOR SEVERAL DAYS , DENIES DYSURIA OR HEMATURIA . NO INJURY OR FALL . AMBULATORY.

## 2012-11-27 NOTE — ED Provider Notes (Signed)
History     CSN: [id number]  Arrival date & time 11/27/12  1952   First MD Initiated Contact with Patient 11/27/12 2211      Chief Complaint  Patient presents with  . Back Pain   HPI  History provided by the patient. Patient is a 52 year old female with history of hypertension, anxiety, depression and arthritis who presents with complaints of right-sided flank and back pain. Symptoms first began 3 days ago on Tuesday. They have been waxing and waning and are described as moderate to severe. Pain is worse with certain movements and lifting. Patient denies any injury or trauma. Pain does not radiate. She denies any urinary complaints. Denies any weakness or numbness in lower extremities. Denies any fever, chills or sweats. Patient has tried using Epson salts, 600 mg ibuprofen, heating pad and gentle massage without improvement. Denies any other aggravating or alleviating factors. No other associated symptoms.     Past Medical History  Diagnosis Date  . Anxiety   . Hypertension   . Arthritis   . Depression   . Bipolar disorder     Past Surgical History  Procedure Laterality Date  . Abdominal hysterectomy    . Wrist surgery      cyst  . Z-plasty repair    . Cesarean section      Family History  Problem Relation Age of Onset  . Diabetes Paternal Grandmother   . Hypertension Paternal Grandmother   . Diabetes Maternal Grandmother   . Hypertension Maternal Grandmother   . Diabetes Mother   . Hypertension Mother   . Diabetes Brother   . Diabetes Sister   . Heart disease Sister   . Hypertension Sister     History  Substance Use Topics  . Smoking status: Current Every Day Smoker -- 0.50 packs/day for 34 years  . Smokeless tobacco: Never Used  . Alcohol Use: Yes     Comment: occassionally    OB History   Grav Para Term Preterm Abortions TAB SAB Ect Mult Living   1 1 1       1       Review of Systems  Constitutional: Negative for fever, chills, appetite change and  unexpected weight change.  Respiratory: Negative for cough and shortness of breath.   Gastrointestinal: Negative for nausea, vomiting, abdominal pain, diarrhea and constipation.  Genitourinary: Negative for dysuria, frequency, hematuria, flank pain, vaginal bleeding and vaginal discharge.  Musculoskeletal: Positive for back pain.  All other systems reviewed and are negative.    Allergies  Review of patient's allergies indicates no known allergies.  Home Medications   Current Outpatient Rx  Name  Route  Sig  Dispense  Refill  . Aspirin-Acetaminophen-Caffeine (GOODY HEADACHE PO)   Oral   Take 1 packet by mouth daily as needed. For headache pain         . clonazePAM (KLONOPIN) 0.5 MG tablet   Oral   Take 0.5 mg by mouth 2 (two) times daily as needed for anxiety.         . DULoxetine (CYMBALTA) 60 MG capsule   Oral   Take 60 mg by mouth daily.         Marland Kitchen ibuprofen (ADVIL,MOTRIN) 600 MG tablet   Oral   Take 600 mg by mouth every 6 (six) hours as needed for pain.         Marland Kitchen QUEtiapine (SEROQUEL) 400 MG tablet   Oral   Take 800 mg by mouth at bedtime.  BP 100/76  Pulse 100  Temp(Src) 98.2 F (36.8 C) (Oral)  Resp 14  SpO2 100%  Physical Exam  Nursing note and vitals reviewed. Constitutional: She is oriented to person, place, and time. She appears well-developed and well-nourished. No distress.  HENT:  Head: Normocephalic.  Cardiovascular: Normal rate and regular rhythm.   Pulmonary/Chest: Effort normal and breath sounds normal. No respiratory distress. She has no wheezes. She has no rales.  Abdominal: Soft. There is no tenderness. There is no rebound and no guarding.  Lower abdominal surgical scar consistent with history of prior surgeries.no CVA tenderness  Musculoskeletal: Normal range of motion. She exhibits no edema and no tenderness.       Lumbar back: She exhibits tenderness. She exhibits no bony tenderness and no edema.       Back:  Very mild  tenderness to the right paraspinous area  Neurological: She is alert and oriented to person, place, and time. She has normal strength. No sensory deficit.  Skin: Skin is warm and dry. No rash noted.  Psychiatric: She has a normal mood and affect. Her behavior is normal.    ED Course  Procedures   Results for orders placed during the hospital encounter of 11/27/12  URINALYSIS, ROUTINE W REFLEX MICROSCOPIC      Result Value Range   Color, Urine YELLOW  YELLOW   APPearance CLOUDY (*) CLEAR   Specific Gravity, Urine 1.029  1.005 - 1.030   pH 7.0  5.0 - 8.0   Glucose, UA NEGATIVE  NEGATIVE mg/dL   Hgb urine dipstick NEGATIVE  NEGATIVE   Bilirubin Urine NEGATIVE  NEGATIVE   Ketones, ur NEGATIVE  NEGATIVE mg/dL   Protein, ur NEGATIVE  NEGATIVE mg/dL   Urobilinogen, UA 1.0  0.0 - 1.0 mg/dL   Nitrite NEGATIVE  NEGATIVE   Leukocytes, UA NEGATIVE  NEGATIVE  POCT I-STAT, CHEM 8      Result Value Range   Sodium 143  135 - 145 mEq/L   Potassium 3.7  3.5 - 5.1 mEq/L   Chloride 113 (*) 96 - 112 mEq/L   BUN 15  6 - 23 mg/dL   Creatinine, Ser 1.61  0.50 - 1.10 mg/dL   Glucose, Bld 096 (*) 70 - 99 mg/dL   Calcium, Ion 0.45 (*) 1.12 - 1.23 mmol/L   TCO2 22  0 - 100 mmol/L   Hemoglobin 12.9  12.0 - 15.0 g/dL   HCT 40.9  81.1 - 91.4 %     1. Back pain   2. Strain of lumbar paraspinal muscle, initial encounter       MDM  10:10 PM patient seen and evaluated. Patient appears well in no acute distress.        Angus Seller, PA 11/28/12 [phone]

## 2012-11-28 NOTE — ED Provider Notes (Signed)
Medical screening examination/treatment/procedure(s) were performed by non-physician practitioner and as supervising physician I was immediately available for consultation/collaboration.  Geoffery Lyons, MD 11/28/12 [phone]

## 2013-03-29 IMAGING — CR DG CHEST 2V
2 series · 2 of 2 positions shown · non-contrast
Comparison: 08/28/2009

CLINICAL DATA: Chest pain and hypertension

CHEST - 2 VIEW

[w chest pa]
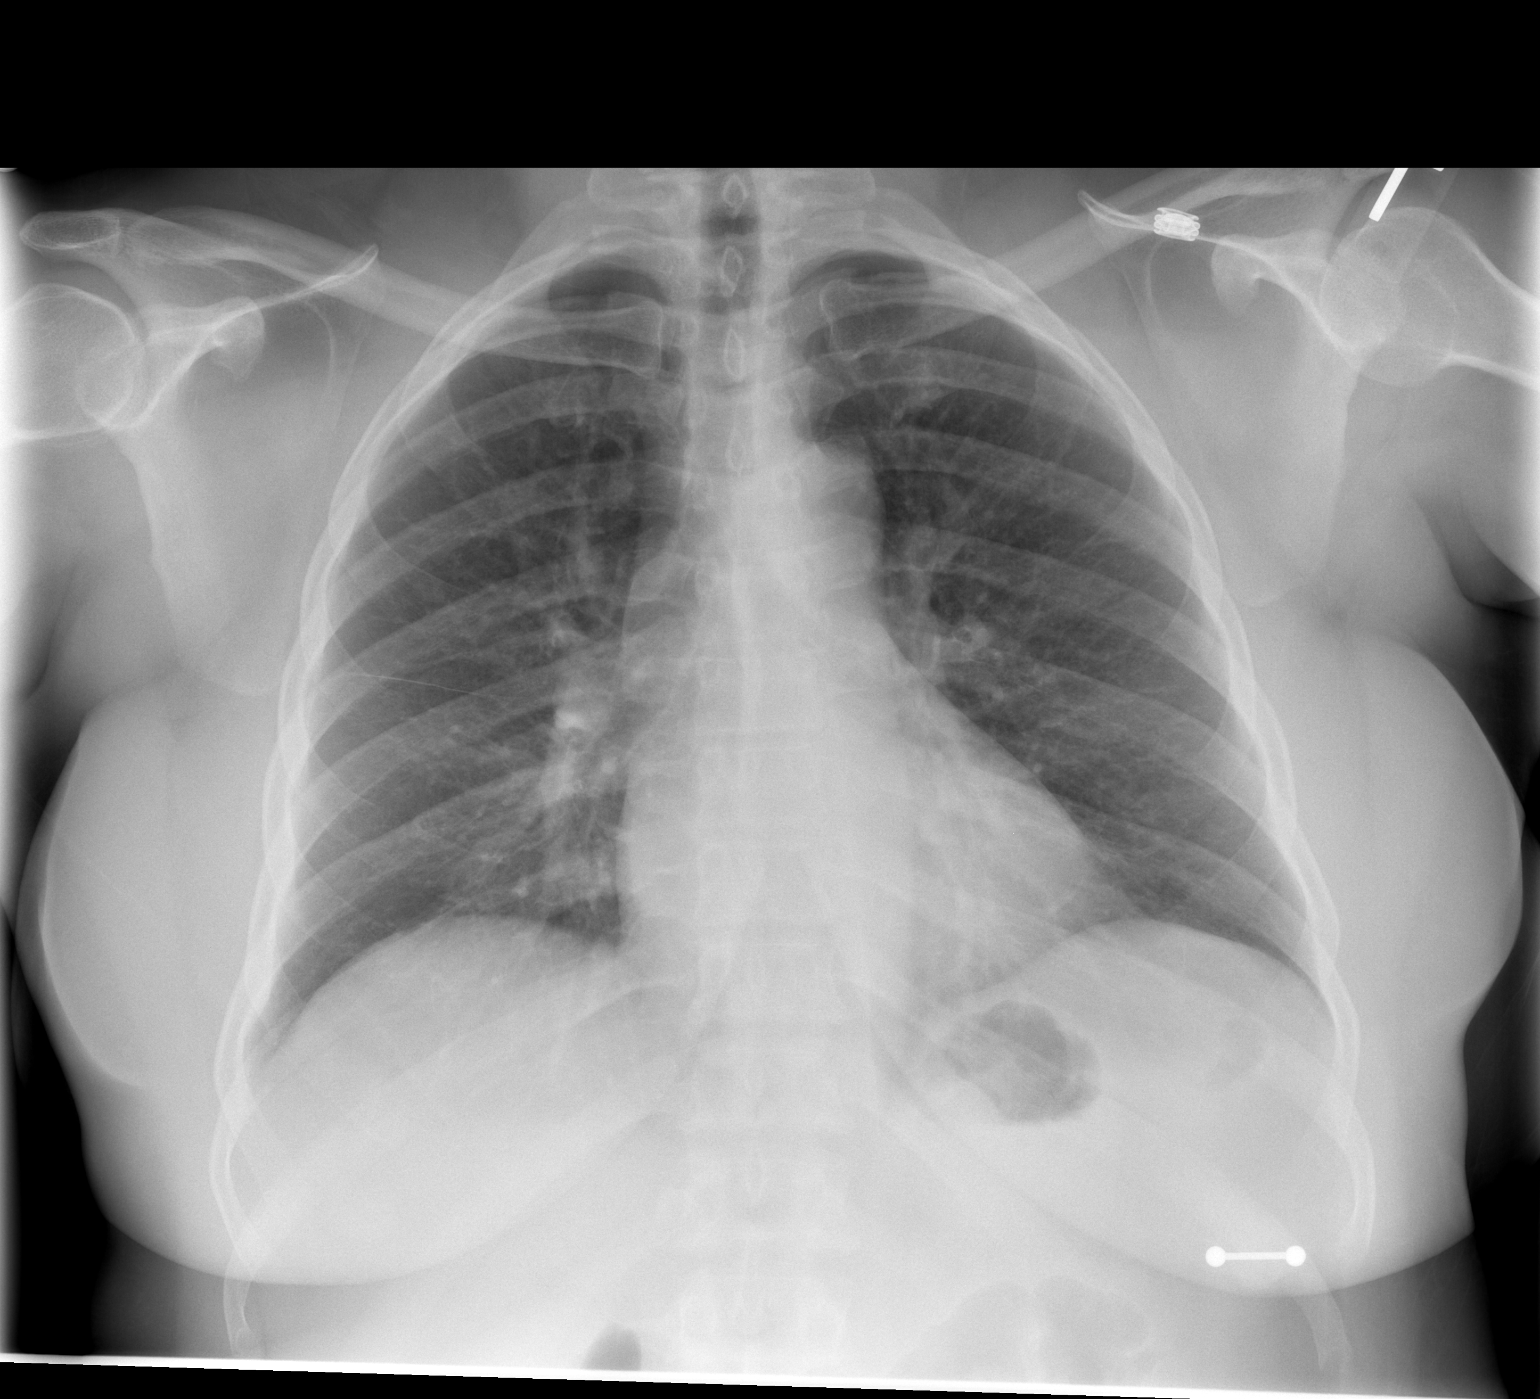

[w chest lat]
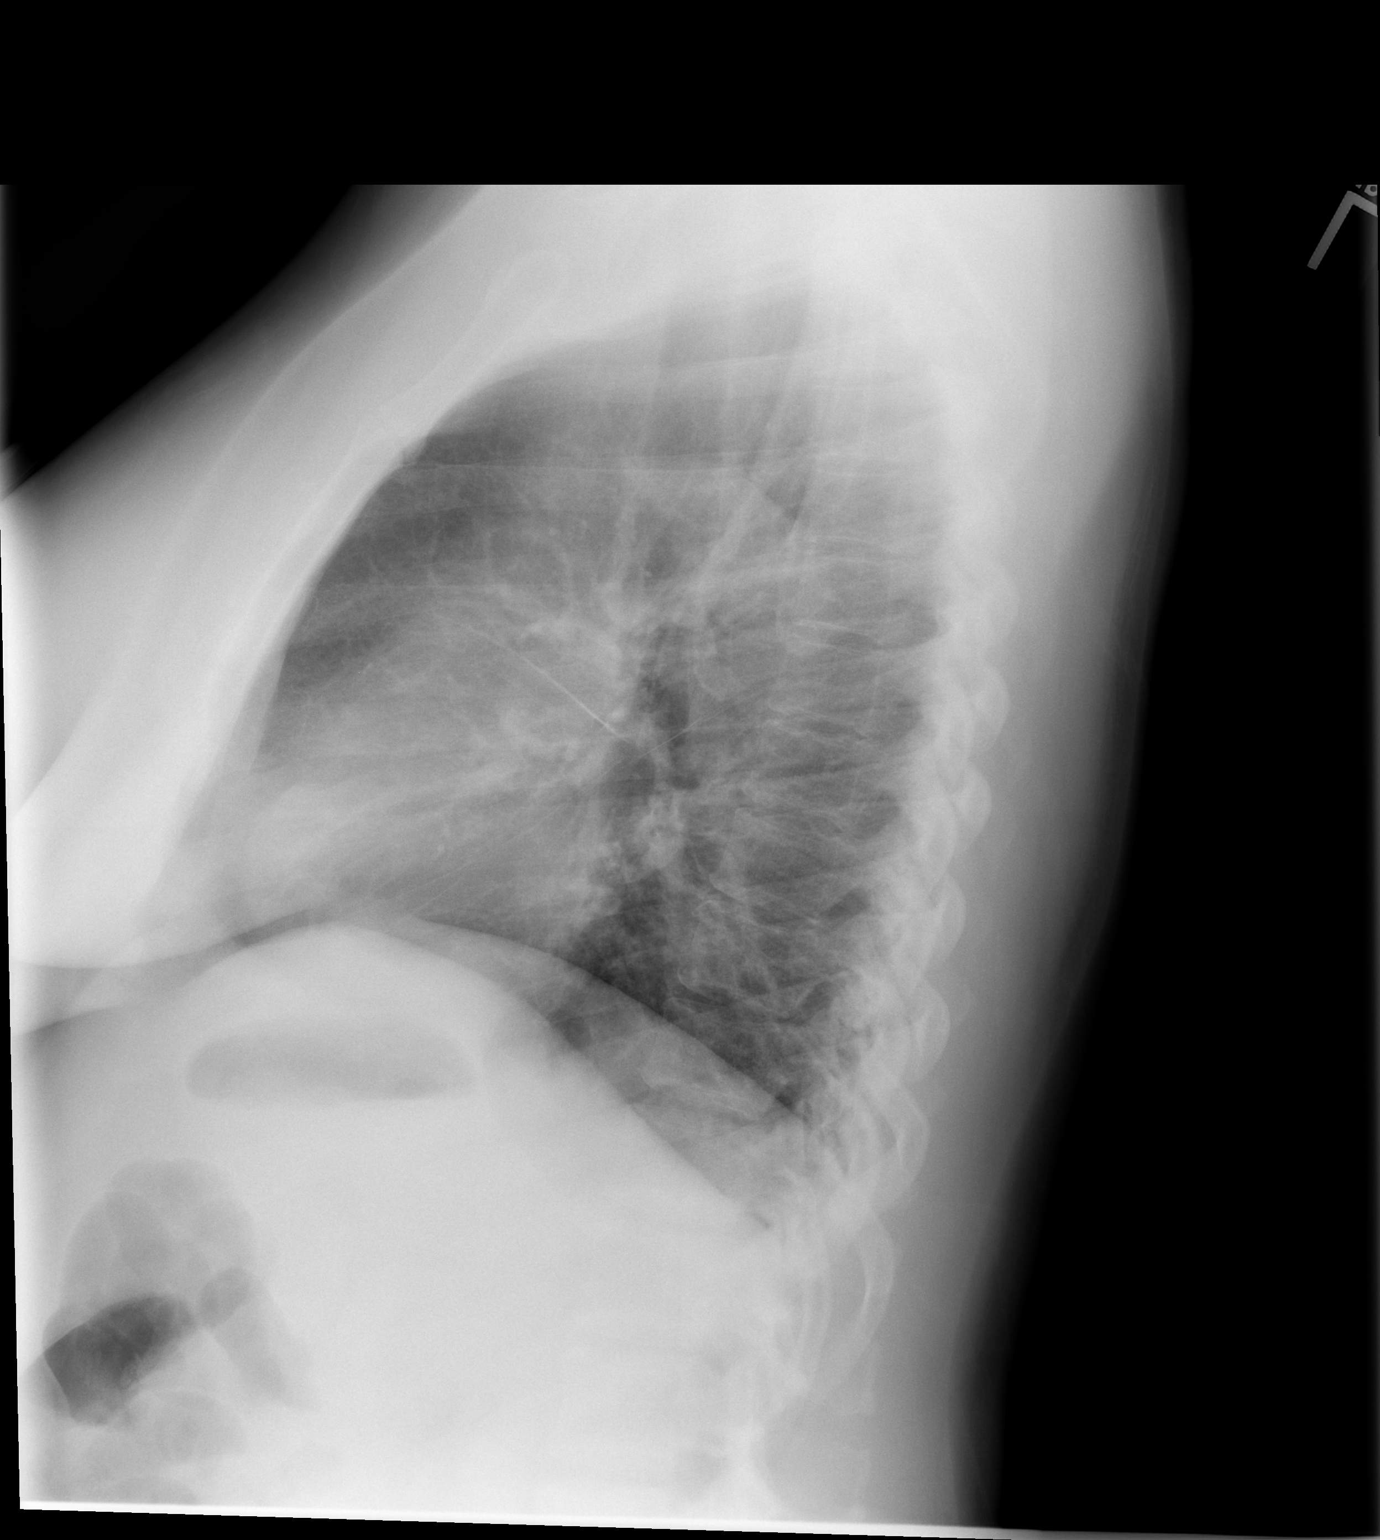

[2 of 2 positions shown; findings below may reference images not displayed]

FINDINGS: The heart size and mediastinal contours are within normal
limits.  Both lungs are clear.  The visualized skeletal structures
are unremarkable.
IMPRESSION: No active cardiopulmonary abnormalities.

## 2013-05-12 ENCOUNTER — Ambulatory Visit: Payer: No Typology Code available for payment source | Attending: Family Medicine

## 2013-06-01 ENCOUNTER — Encounter: Payer: Self-pay | Admitting: Internal Medicine

## 2013-06-01 ENCOUNTER — Ambulatory Visit: Payer: No Typology Code available for payment source | Attending: Internal Medicine | Admitting: Internal Medicine

## 2013-06-01 VITALS — BP 154/100 | HR 92 | Temp 98.2°F | Resp 18 | Ht 62.0 in | Wt 189.0 lb

## 2013-06-01 DIAGNOSIS — E785 Hyperlipidemia, unspecified: Secondary | ICD-10-CM | POA: Insufficient documentation

## 2013-06-01 DIAGNOSIS — F3289 Other specified depressive episodes: Secondary | ICD-10-CM | POA: Insufficient documentation

## 2013-06-01 DIAGNOSIS — I10 Essential (primary) hypertension: Secondary | ICD-10-CM | POA: Insufficient documentation

## 2013-06-01 DIAGNOSIS — F329 Major depressive disorder, single episode, unspecified: Secondary | ICD-10-CM | POA: Insufficient documentation

## 2013-06-01 LAB — LIPID PANEL
HDL: 47 mg/dL (ref >39)
LDL Cholesterol: 187 mg/dL — ABNORMAL HIGH (ref 0–99)
Total CHOL/HDL Ratio: 5.6 Ratio
VLDL: 27 mg/dL (ref 0–40)

## 2013-06-01 LAB — CBC WITH DIFFERENTIAL/PLATELET
Basophils Absolute: 0 10*3/uL (ref 0.0–0.1)
Eosinophils Absolute: 0.1 10*3/uL (ref 0.0–0.7)
Eosinophils Relative: 2 % (ref 0–5)
HCT: 40.5 % (ref 36.0–46.0)
MCH: 28.2 pg (ref 26.0–34.0)
MCV: 82.8 fL (ref 78.0–100.0)
Monocytes Absolute: 0.6 10*3/uL (ref 0.1–1.0)
Platelets: 285 10*3/uL (ref 150–400)
RDW: 15.9 % — ABNORMAL HIGH (ref 11.5–15.5)

## 2013-06-01 LAB — COMPREHENSIVE METABOLIC PANEL
ALT: 13 U/L (ref 0–35)
AST: 23 U/L (ref 0–37)
Alkaline Phosphatase: 56 U/L (ref 39–117)
Sodium: 138 mEq/L (ref 135–145)
Total Bilirubin: 0.4 mg/dL (ref 0.3–1.2)
Total Protein: 8 g/dL (ref 6.0–8.3)

## 2013-06-01 MED ORDER — OMEPRAZOLE 20 MG PO CPDR: 20.0000 mg | DELAYED_RELEASE_CAPSULE | Freq: Every day | ORAL | Status: DC

## 2013-06-01 MED ORDER — FLUTICASONE PROPIONATE 50 MCG/ACT NA SUSP: 2.0000 | Freq: Every day | NASAL | Status: DC

## 2013-06-01 MED ORDER — METHOCARBAMOL 500 MG PO TABS: 500.0000 mg | ORAL_TABLET | Freq: Two times a day (BID) | ORAL | Status: DC

## 2013-06-01 NOTE — Patient Instructions (Addendum)

## 2013-06-01 NOTE — Progress Notes (Signed)
Patient ID: Angel Hughes, female   DOB: May 14, 1961, 52 y.o.   MRN: [id number]   CC: Regular followup  HPI: Patient is 52 year old female with hypertension and depression, presents to clinic for regular followup. Reports compliance with her medicines. Needs refill on omeprazole and muscle relaxant. She explains she has not been checking her blood pressure regularly so is not sure if it's well-controlled. She denies chest pain or breath no recent sicknesses or hospitalizations, no abdominal or urinary concerns.  No Known Allergies Past Medical History  Diagnosis Date  . Anxiety   . Hypertension   . Arthritis   . Depression   . Bipolar disorder    Current Outpatient Prescriptions on File Prior to Visit  Medication Sig Dispense Refill  . Aspirin-Acetaminophen-Caffeine (GOODY HEADACHE PO) Take 1 packet by mouth daily as needed. For headache pain      . clonazePAM (KLONOPIN) 0.5 MG tablet Take 0.5 mg by mouth 2 (two) times daily as needed for anxiety.      . DULoxetine (CYMBALTA) 60 MG capsule Take 60 mg by mouth daily.      Marland Kitchen HYDROcodone-acetaminophen (NORCO) 5-325 MG per tablet Take 1 tablet by mouth every 4 (four) hours as needed for pain.  10 tablet  0  . ibuprofen (ADVIL,MOTRIN) 600 MG tablet Take 600 mg by mouth every 6 (six) hours as needed for pain.      Marland Kitchen QUEtiapine (SEROQUEL) 400 MG tablet Take 800 mg by mouth at bedtime.       No current facility-administered medications on file prior to visit.   Family History  Problem Relation Age of Onset  . Diabetes Paternal Grandmother   . Hypertension Paternal Grandmother   . Diabetes Maternal Grandmother   . Hypertension Maternal Grandmother   . Diabetes Mother   . Hypertension Mother   . Diabetes Brother   . Diabetes Sister   . Heart disease Sister   . Hypertension Sister    History   Social History  . Marital Status: Legally Separated    Spouse Name: N/A    Number of Children: N/A  . Years of Education: N/A   Occupational  History  . Not on file.   Social History Main Topics  . Smoking status: Current Every Day Smoker -- 0.50 packs/day for 34 years  . Smokeless tobacco: Never Used  . Alcohol Use: Yes     Comment: occassionally  . Drug Use: No  . Sexual Activity: Not on file   Other Topics Concern  . Not on file   Social History Narrative  . No narrative on file    Review of Systems  Constitutional: Negative for fever, chills, diaphoresis, activity change, appetite change and fatigue.  HENT: Negative for ear pain, nosebleeds, congestion, facial swelling, rhinorrhea, neck pain, neck stiffness and ear discharge.   Eyes: Negative for pain, discharge, redness, itching and visual disturbance.  Respiratory: Negative for cough, choking, chest tightness, shortness of breath, wheezing and stridor.   Cardiovascular: Negative for chest pain, palpitations and leg swelling.  Gastrointestinal: Negative for abdominal distention.  Genitourinary: Negative for dysuria, urgency, frequency, hematuria, flank pain, decreased urine volume, difficulty urinating and dyspareunia.  Musculoskeletal: Negative for back pain, joint swelling, arthralgias and gait problem.  Neurological: Negative for dizziness, tremors, seizures, syncope, facial asymmetry, speech difficulty, weakness, light-headedness, numbness and headaches.  Hematological: Negative for adenopathy. Does not bruise/bleed easily.  Psychiatric/Behavioral: Negative for hallucinations, behavioral problems, confusion, dysphoric mood, decreased concentration and agitation.    Objective:  Filed Vitals:   06/01/13 1200  BP: 154/100  Pulse: 92  Temp: 98.2 F (36.8 C)  Resp: 18    Physical Exam  Constitutional: Appears well-developed and well-nourished. No distress.  CVS: RRR, S1/S2 +, no murmurs, no gallops, no carotid bruit.  Pulmonary: Effort and breath sounds normal, no stridor, rhonchi, wheezes, rales.  Abdominal: Soft. BS +,  no distension, tenderness,  rebound or guarding.  Neuro: Alert. Normal reflexes, muscle tone coordination. No cranial nerve deficit. Skin: Skin is warm and dry. No rash noted. Not diaphoretic. No erythema. No pallor.  Psychiatric: Normal mood and affect. Behavior, judgment, thought content normal.   Lab Results  Component Value Date   WBC 8.2 10/31/2011   HGB 12.9 11/27/2012   HCT 38.0 11/27/2012   MCV 77.9* 10/31/2011   PLT 300 10/31/2011   Lab Results  Component Value Date   CREATININE 1.00 11/27/2012   BUN 15 11/27/2012   NA 143 11/27/2012   K 3.7 11/27/2012   CL 113* 11/27/2012   CO2 19 02/19/2010    Lab Results  Component Value Date   HGBA1C 5.7* 02/21/2010   Lipid Panel     Component Value Date/Time   CHOL 230* 02/19/2010 2040   TRIG 130 02/19/2010 2040   HDL 48 02/19/2010 2040   CHOLHDL 4.8 Ratio 02/19/2010 2040   VLDL 26 02/19/2010 2040   LDLCALC 156* 02/19/2010 2040       Assessment and plan:   Patient Active Problem List   Diagnosis Date Noted  . Hypertension - slightly above target range on today's examination. I have encouraged patient to check blood pressure regularly and to call us back if the numbers are persistently higher than 140/90. We may need to readjust medical regimen as indicated based on the blood pressure control. We'll plan on checking electrolyte panel today, A1c to assure patient is not diabetic.    Marland Kitchen Depression - stable, follows with psychiatrist    .  hyperlipidemia  - will check lipid panel today

## 2013-06-01 NOTE — Progress Notes (Signed)
Here to establish care Was told x 1 yr to f/u for increased blood levels cholesterol,A1C, and anemia. Hx. HTN- NOT TAKING MEDS

## 2013-06-02 LAB — HEMOGLOBIN A1C: Hgb A1c MFr Bld: 6 % — ABNORMAL HIGH (ref <5.7)

## 2013-06-22 ENCOUNTER — Encounter: Payer: Self-pay | Admitting: Family Medicine

## 2013-08-31 ENCOUNTER — Ambulatory Visit: Payer: No Typology Code available for payment source | Admitting: Internal Medicine

## 2013-09-15 ENCOUNTER — Ambulatory Visit: Payer: No Typology Code available for payment source | Admitting: Internal Medicine

## 2013-09-17 ENCOUNTER — Ambulatory Visit: Payer: No Typology Code available for payment source | Attending: Internal Medicine | Admitting: Internal Medicine

## 2013-09-17 ENCOUNTER — Encounter: Payer: Self-pay | Admitting: Internal Medicine

## 2013-09-17 VITALS — BP 142/99 | HR 99 | Temp 97.9°F | Resp 16 | Ht 62.0 in | Wt 197.0 lb

## 2013-09-17 DIAGNOSIS — F411 Generalized anxiety disorder: Secondary | ICD-10-CM | POA: Insufficient documentation

## 2013-09-17 DIAGNOSIS — I10 Essential (primary) hypertension: Secondary | ICD-10-CM

## 2013-09-17 MED ORDER — FLUTICASONE PROPIONATE 50 MCG/ACT NA SUSP: 2.0000 | Freq: Every day | NASAL | Status: DC

## 2013-09-17 MED ORDER — HYDROCODONE-ACETAMINOPHEN 5-325 MG PO TABS: 1.0000 | ORAL_TABLET | ORAL | Status: DC | PRN

## 2013-09-17 MED ORDER — PRAVASTATIN SODIUM 10 MG PO TABS: 10.0000 mg | ORAL_TABLET | Freq: Every day | ORAL | Status: DC

## 2013-09-17 MED ORDER — OMEPRAZOLE 20 MG PO CPDR: 20.0000 mg | DELAYED_RELEASE_CAPSULE | Freq: Every day | ORAL | Status: DC

## 2013-09-17 MED ORDER — CLONAZEPAM 0.5 MG PO TABS: 0.5000 mg | ORAL_TABLET | Freq: Two times a day (BID) | ORAL | Status: DC | PRN

## 2013-09-17 MED ORDER — IBUPROFEN 600 MG PO TABS: 600.0000 mg | ORAL_TABLET | Freq: Four times a day (QID) | ORAL | Status: DC | PRN

## 2013-09-17 MED ORDER — DULOXETINE HCL 60 MG PO CPEP: 60.0000 mg | ORAL_CAPSULE | Freq: Every day | ORAL | Status: DC

## 2013-09-17 MED ORDER — QUETIAPINE FUMARATE 400 MG PO TABS: 800.0000 mg | ORAL_TABLET | Freq: Every day | ORAL | Status: DC

## 2013-09-17 MED ORDER — METHOCARBAMOL 500 MG PO TABS: 500.0000 mg | ORAL_TABLET | Freq: Two times a day (BID) | ORAL | Status: DC

## 2013-09-17 NOTE — Progress Notes (Signed)
Pt is here requesting to review her lab results.

## 2013-09-17 NOTE — Progress Notes (Signed)
Patient ID: Angel Hughes, female   DOB: May 01, 1961, 52 y.o.   MRN: [id number]   CC: Blood tests  HPI: Patient is 52 year old female with hypertension who presents to clinic to discuss blood tests. She explains she has been doing well, denies concerns this visit, no chest pain shortness of breath, no specific abdominal or urinary concerns. She reports compliance with medicines.  No Known Allergies Past Medical History  Diagnosis Date  . Anxiety   . Hypertension   . Arthritis   . Depression   . Bipolar disorder    Current Outpatient Prescriptions on File Prior to Visit  Medication Sig Dispense Refill  . Aspirin-Acetaminophen-Caffeine (GOODY HEADACHE PO) Take 1 packet by mouth daily as needed. For headache pain       No current facility-administered medications on file prior to visit.   Family History  Problem Relation Age of Onset  . Diabetes Paternal Grandmother   . Hypertension Paternal Grandmother   . Diabetes Maternal Grandmother   . Hypertension Maternal Grandmother   . Diabetes Mother   . Hypertension Mother   . Diabetes Brother   . Diabetes Sister   . Heart disease Sister   . Hypertension Sister    History   Social History  . Marital Status: Legally Separated    Spouse Name: N/A    Number of Children: N/A  . Years of Education: N/A   Occupational History  . Not on file.   Social History Main Topics  . Smoking status: Current Every Day Smoker -- 0.50 packs/day for 34 years  . Smokeless tobacco: Never Used  . Alcohol Use: Yes     Comment: occassionally  . Drug Use: No  . Sexual Activity: Not on file   Other Topics Concern  . Not on file   Social History Narrative  . No narrative on file    Review of Systems  Constitutional: Negative for fever, chills, diaphoresis, activity change, appetite change and fatigue.  HENT: Negative for ear pain, nosebleeds, congestion, facial swelling, rhinorrhea, neck pain, neck stiffness and ear discharge.   Eyes: Negative  for pain, discharge, redness, itching and visual disturbance.  Respiratory: Negative for cough, choking, chest tightness, shortness of breath, wheezing and stridor.   Cardiovascular: Negative for chest pain, palpitations and leg swelling.  Gastrointestinal: Negative for abdominal distention.  Genitourinary: Negative for dysuria, urgency, frequency, hematuria, flank pain, decreased urine volume, difficulty urinating and dyspareunia.  Musculoskeletal: Negative for back pain, joint swelling, arthralgias and gait problem.  Neurological: Negative for dizziness, tremors, seizures, syncope, facial asymmetry, speech difficulty, weakness, light-headedness, numbness and headaches.  Hematological: Negative for adenopathy. Does not bruise/bleed easily.  Psychiatric/Behavioral: Negative for hallucinations, behavioral problems, confusion, dysphoric mood, decreased concentration and agitation.    Objective:   Filed Vitals:   09/17/13 1109  BP: 142/99  Pulse: 99  Temp: 97.9 F (36.6 C)  Resp: 16    Physical Exam  Constitutional: Appears well-developed and well-nourished. No distress.  CVS: RRR, S1/S2 +, no murmurs, no gallops, no carotid bruit.  Pulmonary: Effort and breath sounds normal, no stridor, rhonchi, wheezes, rales.  Abdominal: Soft. BS +,  no distension, tenderness, rebound or guarding.    Lab Results  Component Value Date   WBC 7.6 06/01/2013   HGB 13.8 06/01/2013   HCT 40.5 06/01/2013   MCV 82.8 06/01/2013   PLT 285 06/01/2013   Lab Results  Component Value Date   CREATININE 1.07 06/01/2013   BUN 12 06/01/2013   NA 138  06/01/2013   K 4.0 06/01/2013   CL 106 06/01/2013   CO2 25 06/01/2013    Lab Results  Component Value Date   HGBA1C 6.0* 06/01/2013   Lipid Panel     Component Value Date/Time   CHOL 261* 06/01/2013 1209   TRIG 136 06/01/2013 1209   HDL 47 06/01/2013 1209   CHOLHDL 5.6 06/01/2013 1209   VLDL 27 06/01/2013 1209   LDLCALC 187* 06/01/2013 1209       Assessment and plan:   Patient  Active Problem List   Diagnosis Date Noted  . Anxiety - appears to be stable and at baseline    . Hypertension - we have discussed target blood pressure range and patient verbalizes understanding. I advised patient to check blood pressure regularly and to call his back in the numbers are higher than 140/90.    Marland Kitchen  hyperlipidemia  = we have discussed findings of the lipid panel and cholesterol 261 with LDL of 187. Will start low dose of pravastatin 10 mg daily. I've explained to patient that we'll check cholesterol panel in 3 months and readjust her regimen if indicated. Dietary recommendations provided.

## 2013-09-17 NOTE — Patient Instructions (Signed)

## 2013-12-16 ENCOUNTER — Ambulatory Visit: Payer: Self-pay | Admitting: Internal Medicine

## 2013-12-27 ENCOUNTER — Ambulatory Visit: Payer: Self-pay | Attending: Internal Medicine

## 2014-08-01 ENCOUNTER — Encounter: Payer: Self-pay | Admitting: Internal Medicine

## 2015-04-26 ENCOUNTER — Encounter: Payer: Self-pay | Admitting: Physician Assistant

## 2015-05-10 ENCOUNTER — Encounter: Payer: Self-pay | Admitting: Physician Assistant

## 2015-05-10 ENCOUNTER — Ambulatory Visit (INDEPENDENT_AMBULATORY_CARE_PROVIDER_SITE_OTHER): Payer: No Typology Code available for payment source | Admitting: Physician Assistant

## 2015-05-10 VITALS — BP 120/80 | HR 88 | Ht 62.0 in | Wt 192.0 lb

## 2015-05-10 DIAGNOSIS — K5900 Constipation, unspecified: Secondary | ICD-10-CM

## 2015-05-10 DIAGNOSIS — Z1211 Encounter for screening for malignant neoplasm of colon: Secondary | ICD-10-CM | POA: Diagnosis not present

## 2015-05-10 DIAGNOSIS — K219 Gastro-esophageal reflux disease without esophagitis: Secondary | ICD-10-CM | POA: Diagnosis not present

## 2015-05-10 DIAGNOSIS — R1310 Dysphagia, unspecified: Secondary | ICD-10-CM

## 2015-05-10 MED ORDER — PANTOPRAZOLE SODIUM 40 MG PO TBEC: 40.0000 mg | DELAYED_RELEASE_TABLET | Freq: Every day | ORAL | Status: DC

## 2015-05-10 MED ORDER — PEG-KCL-NACL-NASULF-NA ASC-C 100 G PO SOLR: 1.0000 | Freq: Once | ORAL | Status: DC

## 2015-05-10 NOTE — Progress Notes (Signed)
I agree with the above note, plan 

## 2015-05-10 NOTE — Progress Notes (Signed)
Patient ID: [name], female   DOB: Jun 14, 1961, 54 y.o.   MRN: [id number]    HPI:  Angel Hughes is a 54 y.o.   female  referred by Quentin Angst, MD for evaluation of GERD and constipation.  Angel Hughes is a delightful female with a history of hyperlipidemia, GERD, obesity, bipolar disorder, arthritis, hypertension, and anxiety. She reports that she has had heartburn on a daily basis for 5 years. Initially she tried using toms or Rolaids with no relief so she then tried bananas and milk which she shows helped for a while. She gets heartburn on a daily basis regardless of what she eats. Over the past year she has been having intermittent dysphagia to solids and liquids and has to stop eating to let her food pass. She has not had to spit food up. She frequently has nocturnal regurgitation with mouthfuls of better fluid coming up, and wakes up in the morning with a bad taste in her mouth. She coughs every night he causes of throat irritation and feels very hoarse every morning. She has been experiencing epigastric pain that is not affected by food. She is nauseous in waves and feels her nausea is worse after meals. Her appetite has been good and she has had no early satiety. She readily admits that she eats spicy foods and fried foods regularly.  She also reports that she has been constipated for most of her adult life. She typically skips 3-4 days between bowel movements and then will pass a nugget-like stool followed by a formed stool. She has had mucus with her stools but has had no jet black tarry stools nor has she had bright red blood per rectum. She states that once a month she will drink Epson salts and water to clean herself out. More recently she had started using Mira lax one capful daily but it is not provided any relief. She reports that her mother had colon polyps removed in her 64s. She is not aware of a family history of colon cancer or inflammatory bowel disease. Her mother is currently  alive at age 37, but her father died at age 22 of alcoholic cirrhosis. Patient denies use of alcohol or recreational drugs. She does smoke half a pack of cigarettes daily.   Past Medical History  Diagnosis Date  . Anxiety   . Hypertension   . Arthritis   . Depression   . Bipolar disorder   . Obesity   . Allergic rhinitis   . GERD (gastroesophageal reflux disease)   . Hyperlipemia   . Anemia     Past Surgical History  Procedure Laterality Date  . Abdominal hysterectomy    . Wrist surgery      cyst  . Z-plasty repair    . Cesarean section     Family History  Problem Relation Age of Onset  . Diabetes Paternal Grandmother   . Hypertension Paternal Grandmother   . Diabetes Maternal Grandmother   . Hypertension Maternal Grandmother   . Diabetes Mother   . Hypertension Mother   . Diabetes Brother   . Diabetes Sister   . Heart disease Sister   . Hypertension Sister    Social History  Substance Use Topics  . Smoking status: Current Every Day Smoker -- 0.50 packs/day for 34 years  . Smokeless tobacco: Never Used  . Alcohol Use: 0.0 oz/week    0 Standard drinks or equivalent per week     Comment: occassionally   Current Outpatient Prescriptions  Medication Sig Dispense Refill  . cloNIDine (CATAPRES) 0.1 MG tablet Take 0.1 mg by mouth 2 (two) times daily.    . hydrOXYzine (VISTARIL) 25 MG capsule Take 25 mg by mouth 3 (three) times daily as needed.    Marland Kitchen QUEtiapine (SEROQUEL) 400 MG tablet Take 2 tablets (800 mg total) by mouth at bedtime. 60 tablet 3  . topiramate (TOPAMAX) 100 MG tablet Take 150 mg by mouth daily.    . pantoprazole (PROTONIX) 40 MG tablet Take 1 tablet (40 mg total) by mouth daily. 30 tablet 6  . peg 3350 powder (MOVIPREP) 100 G SOLR Take 1 kit (200 g total) by mouth once. 1 kit 0   No current facility-administered medications for this visit.   No Known Allergies   Review of Systems: Gen: Denies any fever, chills, sweats, anorexia, fatigue, weakness,  malaise, weight loss, and sleep disorder CV: Denies chest pain, angina, palpitations, syncope, orthopnea, PND, peripheral edema, and claudication. Resp: Denies dyspnea at rest, dyspnea with exercise, cough, sputum, wheezing, coughing up blood, and pleurisy. GI: Denies vomiting blood, jaundice, and fecal incontinence.  Has intermittent dysphagia to solids. GU : Denies urinary burning, blood in urine, urinary frequency, urinary hesitancy, nocturnal urination, and urinary incontinence. MS: Denies joint pain, limitation of movement, and swelling, stiffness, low back pain, extremity pain. Denies muscle weakness, cramps, atrophy.  Derm: Denies rash, itching, dry skin, hives, moles, warts, or unhealing ulcers.  Psych: Denies depression, anxiety, memory loss, suicidal ideation, hallucinations, paranoia, and confusion. Heme: Denies bruising, bleeding, and enlarged lymph nodes. Neuro:  Denies any headaches, dizziness, paresthesias. Endo:  Denies any problems with DM, thyroid, adrenal function    LAB RESULTS: Blood work done at [facility] on 02/09/2015: Total bili 0.3, alkaline phosphatase 61, AST 21, ALT 14, albumin 4.6. CBC white count 6.3, hemoglobin 11.3, hematocrit 39.9, platelets 182,000. MCV 80.4. TSH 0.634 HIV nonreactive    Physical Exam: BP 120/80 mmHg  Pulse 88  Ht 5\' 2"  (1.575 m)  Wt 192 lb (87.091 kg)  BMI 35.11 kg/m2 Constitutional: Pleasant,well-developed, African-American female in no acute distress. HEENT: Normocephalic and atraumatic. Conjunctivae are normal. No scleral icterus. Nasal piercings Neck supple. No JVD Cardiovascular: Normal rate, regular rhythm.  Pulmonary/chest: Effort normal and breath sounds normal. No wheezing, rales or rhonchi. Abdominal: Soft, nondistended, nontender. Bowel sounds active throughout. There are no masses palpable. No hepatomegaly. Extremities: no edema Lymphadenopathy: No cervical adenopathy noted. Neurological: Alert and  oriented to person place and time. Skin: Skin is warm and dry. No rashes noted. Psychiatric: Normal mood and affect. Behavior is normal.  ASSESSMENT AND PLAN: #1 GERD. An antireflux regimen has been reviewed. She will be given a trial of pantoprazole 40 mg by mouth 30 minutes prior to breakfast. She will be scheduled for an EGD to evaluate for esophagitis, stricture, gastritis, ulcer, duodenitis etc.The risks, benefits, and alternatives to endoscopy with possible biopsy and possible dilation were discussed with the patient and they consent to proceed.   #2. Constipation. Patient readily admits that she does not eat a lot of fruits and vegetables. She has been instructed to increase fiber and water in her diet. She will increase Mira lax to 2 capfuls daily as well.  #3. Colorectal cancer screening. Patient is 48 and has never had a screening colonoscopy. She will be scheduled for colonoscopy to screen for polyps or neoplasia.The risks, benefits, and alternatives to colonoscopy with possible biopsy and possible polypectomy were discussed with the patient and they consent to  proceed.   Further recommendations will be made pending the findings of the above.  Fedrick Cefalu, Tollie Pizza PA-C 05/10/2015, 11:40 AM  CC: Quentin Angst, MD

## 2015-05-10 NOTE — Patient Instructions (Signed)
You have been scheduled for an endoscopy and colonoscopy. Please follow the written instructions given to you at your visit today. Please pick up your prep supplies at the pharmacy within the next 1-3 days. If you use inhalers (even only as needed), please bring them with you on the day of your procedure.  Increase your Miralax to 2 capfuls daily.   Patient advised to avoid spicy, acidic, citrus, chocolate, mints, fruit and fruit juices.  Limit the intake of caffeine, alcohol and Soda.  Don't exercise too soon after eating.  Don't lie down within 3-4 hours of eating.  Elevate the head of your bed.  We have sent the following medications to your pharmacy for you to pick up at your convenience:pantoprazole.  Start "P" fruits such as: peaches, pears, prunes, plums, pineapple, pear juice.

## 2015-05-15 ENCOUNTER — Other Ambulatory Visit: Payer: Self-pay

## 2015-05-15 DIAGNOSIS — Z1231 Encounter for screening mammogram for malignant neoplasm of breast: Secondary | ICD-10-CM

## 2015-05-22 ENCOUNTER — Ambulatory Visit
Admission: RE | Admit: 2015-05-22 | Discharge: 2015-05-22 | Disposition: A | Discharge status: Home / Self Care | Payer: No Typology Code available for payment source | Source: Ambulatory Visit

## 2015-05-22 DIAGNOSIS — Z1231 Encounter for screening mammogram for malignant neoplasm of breast: Secondary | ICD-10-CM

## 2015-06-14 ENCOUNTER — Telehealth: Payer: Self-pay | Admitting: Gastroenterology

## 2015-06-14 NOTE — Telephone Encounter (Signed)
Script sent to pharmacy.

## 2015-07-19 ENCOUNTER — Encounter: Payer: Self-pay | Admitting: Gastroenterology

## 2015-07-19 ENCOUNTER — Ambulatory Visit (AMBULATORY_SURGERY_CENTER): Payer: No Typology Code available for payment source | Admitting: Gastroenterology

## 2015-07-19 VITALS — BP 127/85 | HR 101 | Temp 97.1°F | Resp 14 | Ht 62.0 in | Wt 192.0 lb

## 2015-07-19 DIAGNOSIS — K2970 Gastritis, unspecified, without bleeding: Secondary | ICD-10-CM | POA: Diagnosis not present

## 2015-07-19 DIAGNOSIS — K5909 Other constipation: Secondary | ICD-10-CM

## 2015-07-19 DIAGNOSIS — Z1211 Encounter for screening for malignant neoplasm of colon: Secondary | ICD-10-CM

## 2015-07-19 DIAGNOSIS — D125 Benign neoplasm of sigmoid colon: Secondary | ICD-10-CM

## 2015-07-19 DIAGNOSIS — K2990 Gastroduodenitis, unspecified, without bleeding: Secondary | ICD-10-CM

## 2015-07-19 DIAGNOSIS — K219 Gastro-esophageal reflux disease without esophagitis: Secondary | ICD-10-CM

## 2015-07-19 DIAGNOSIS — D120 Benign neoplasm of cecum: Secondary | ICD-10-CM

## 2015-07-19 DIAGNOSIS — D128 Benign neoplasm of rectum: Secondary | ICD-10-CM

## 2015-07-19 DIAGNOSIS — D129 Benign neoplasm of anus and anal canal: Secondary | ICD-10-CM

## 2015-07-19 DIAGNOSIS — K621 Rectal polyp: Secondary | ICD-10-CM

## 2015-07-19 MED ORDER — SODIUM CHLORIDE 0.9 % IV SOLN: 500.0000 mL | INTRAVENOUS | Status: DC

## 2015-07-19 NOTE — Patient Instructions (Addendum)
One of your biggest health concerns is your smoking.  This increases your risk for most cancers and serious cardiovascular diseases such as strokes, heart attacks.  You should try your best to stop.  If you need assistance, please contact your PCP or Smoking Cessation Class at [facility] [phone]) or [facility] (1-800-QUIT-NOW).  Discharge instructions given. Handouts on polyps and gastritis. Resume previous medications. YOU HAD AN ENDOSCOPIC PROCEDURE TODAY AT [facility]:   Refer to the procedure report that was given to you for any specific questions about what was found during the examination.  If the procedure report does not answer your questions, please call your gastroenterologist to clarify.  If you requested that your care partner not be given the details of your procedure findings, then the procedure report has been included in a sealed envelope for you to review at your convenience later.  YOU SHOULD EXPECT: Some feelings of bloating in the abdomen. Passage of more gas than usual.  Walking can help get rid of the air that was put into your GI tract during the procedure and reduce the bloating. If you had a lower endoscopy (such as a colonoscopy or flexible sigmoidoscopy) you may notice spotting of blood in your stool or on the toilet paper. If you underwent a bowel prep for your procedure, you may not have a normal bowel movement for a few days.  Please Note:  You might notice some irritation and congestion in your nose or some drainage.  This is from the oxygen used during your procedure.  There is no need for concern and it should clear up in a day or so.  SYMPTOMS TO REPORT IMMEDIATELY:   Following lower endoscopy (colonoscopy or flexible sigmoidoscopy):  Excessive amounts of blood in the stool  Significant tenderness or worsening of abdominal pains  Swelling of the abdomen that is new, acute  Fever of 100F or higher   Following upper endoscopy  (EGD)  Vomiting of blood or coffee ground material  New chest pain or pain under the shoulder blades  Painful or persistently difficult swallowing  New shortness of breath  Fever of 100F or higher  Black, tarry-looking stools  For urgent or emergent issues, a gastroenterologist can be reached at any hour by calling [phone].   DIET: Your first meal following the procedure should be a small meal and then it is ok to progress to your normal diet. Heavy or fried foods are harder to digest and may make you feel nauseous or bloated.  Likewise, meals heavy in dairy and vegetables can increase bloating.  Drink plenty of fluids but you should avoid alcoholic beverages for 24 hours.  ACTIVITY:  You should plan to take it easy for the rest of today and you should NOT DRIVE or use heavy machinery until tomorrow (because of the sedation medicines used during the test).    FOLLOW UP: Our staff will call the number listed on your records the next business day following your procedure to check on you and address any questions or concerns that you may have regarding the information given to you following your procedure. If we do not reach you, we will leave a message.  However, if you are feeling well and you are not experiencing any problems, there is no need to return our call.  We will assume that you have returned to your regular daily activities without incident.  If any biopsies were taken you will be contacted by phone  or by letter within the next 1-3 weeks.  Please call us at [phone] if you have not heard about the biopsies in 3 weeks.    SIGNATURES/CONFIDENTIALITY: You and/or your care partner have signed paperwork which will be entered into your electronic medical record.  These signatures attest to the fact that that the information above on your After Visit Summary has been reviewed and is understood.  Full responsibility of the confidentiality of this discharge information lies with  you and/or your care-partner.

## 2015-07-19 NOTE — Progress Notes (Signed)
Called to room to assist during endoscopic procedure.  Patient ID and intended procedure confirmed with present staff. Received instructions for my participation in the procedure from the performing physician.  

## 2015-07-19 NOTE — Progress Notes (Signed)
A/ox3 pleased with MAC, report to Celia RN 

## 2015-07-19 NOTE — Op Note (Signed)
[place]  [address] [place], [id number]   ENDOSCOPY PROCEDURE REPORT  PATIENT: Angel, Hughes  MR#: [id number] BIRTHDATE: 1961/08/01 , 69  yrs. old GENDER: female ENDOSCOPIST: Milus Banister, MD PROCEDURE DATE:  07/19/2015 PROCEDURE:  EGD w/ biopsy ASA CLASS:     Class II INDICATIONS:  GERD, intermittent dysphagia. MEDICATIONS: Monitored anesthesia care, Propofol 150 mg IV, and Residual sedation present TOPICAL ANESTHETIC: none  DESCRIPTION OF PROCEDURE: After the risks benefits and alternatives of the procedure were thoroughly explained, informed consent was obtained.  The LB [id number] endoscope was introduced through the mouth and advanced to the second portion of the duodenum , Without limitations.  The instrument was slowly withdrawn as the mucosa was fully examined.  There was mild, atrophic appearing distal gastritis.  This was biopsied and sent to pathology.  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  The examination was otherwise normal.  Retroflexed views revealed no abnormalities.     The scope was then withdrawn from the patient and the procedure completed.  COMPLICATIONS: There were no immediate complications.  ENDOSCOPIC IMPRESSION: There was mild, atrophic appearing distal gastritis.  This was biopsied and sent to pathology.  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  The examination was otherwise normal  RECOMMENDATIONS: Please begin the once daily omeprazole (anti-acid medicine) that was called into your pharmacy last month.  This is best taken 20-30 min prior to breakfast meal.  If the biopsies show H.  pylori, you will be started on the correct antibiotics.  eSigned:  Milus Banister, MD 07/19/2015 10:14 AM

## 2015-07-19 NOTE — Op Note (Signed)
[place]  [address]. [place], [id number]   COLONOSCOPY PROCEDURE REPORT  PATIENT: Angel, Hughes  MR#: [id number] BIRTHDATE: 1960/12/25 , 54  yrs. old GENDER: female ENDOSCOPIST: Milus Banister, MD REFERRED [id number] Doreene Burke, M.D. PROCEDURE DATE:  07/19/2015 PROCEDURE:   Colonoscopy, screening and Colonoscopy with snare polypectomy First Screening Colonoscopy - Avg.  risk and is 50 yrs.  old or older Yes.  Prior Negative Screening - Now for repeat screening. N/A  History of Adenoma - Now for follow-up colonoscopy & has been > or = to 3 yrs.  N/A  Polyps removed today? Yes ASA CLASS:   Class II INDICATIONS:Screening for colonic neoplasia and Colorectal Neoplasm Risk Assessment for this procedure is average risk. MEDICATIONS: Monitored anesthesia care and Propofol 250 mg IV  DESCRIPTION OF PROCEDURE:   After the risks benefits and alternatives of the procedure were thoroughly explained, informed consent was obtained.  The digital rectal exam revealed no abnormalities of the rectum.   The LB [id number]  endoscope was introduced through the anus and advanced to the cecum, which was identified by both the appendix and ileocecal valve. No adverse events experienced.   The quality of the prep was excellent.  The instrument was then slowly withdrawn as the colon was fully examined. Estimated blood loss is zero unless otherwise noted in this procedure report.   COLON FINDINGS: Three sessile polyps ranging between 3-53mm in size were found in the rectum, sigmoid colon, and at the cecum. Polypectomies were performed with a cold snare.  The resection was complete, the polyp tissue was completely retrieved and sent to histology.   The examination was otherwise normal.  Retroflexed views revealed no abnormalities. The time to cecum = 5.1 Withdrawal time = 8.5   The scope was withdrawn and the procedure completed. COMPLICATIONS: There were no immediate  complications.  ENDOSCOPIC IMPRESSION: 1.   Three sessile polyps ranging between 3-68mm in size were found in the rectum, sigmoid colon, and at the cecum; polypectomies were performed with a cold snare 2.   The examination was otherwise normal  RECOMMENDATIONS: If the polyp(s) removed today are proven to be adenomatous (pre-cancerous) polyps, you will need a colonoscopy in 3-5  years. Otherwise you should continue to follow colorectal cancer screening guidelines for "routine risk" patients with a colonoscopy in 10 years.  You will receive a letter within 1-2 weeks with the results of your biopsy as well as final recommendations.  Please call my office if you have not received a letter after 3 weeks.  eSigned:  Milus Banister, MD 07/19/2015 10:11 AM

## 2015-07-20 ENCOUNTER — Telehealth: Payer: Self-pay | Admitting: *Deleted

## 2015-07-20 NOTE — Telephone Encounter (Signed)
  Follow up Call-  No flowsheet data found. called [phone]  Patient questions:  Do you have a fever, pain , or abdominal swelling? No. Pain Score  0 *  Have you tolerated food without any problems? Yes.    Have you been able to return to your normal activities? Yes.    Do you have any questions about your discharge instructions: Diet   No. Medications  No. Follow up visit  No.  Do you have questions or concerns about your Care? No.  Actions: * If pain score is 4 or above: No action needed, pain <4.

## 2015-07-26 ENCOUNTER — Encounter: Payer: Self-pay | Admitting: Gastroenterology

## 2017-08-11 ENCOUNTER — Encounter (HOSPITAL_COMMUNITY): Payer: Self-pay

## 2018-12-13 ENCOUNTER — Encounter (HOSPITAL_COMMUNITY): Payer: Self-pay | Admitting: *Deleted

## 2018-12-13 ENCOUNTER — Other Ambulatory Visit: Payer: Self-pay

## 2018-12-13 ENCOUNTER — Emergency Department (HOSPITAL_COMMUNITY): Payer: Self-pay

## 2018-12-13 ENCOUNTER — Emergency Department (HOSPITAL_COMMUNITY)
Admission: EM | Admit: 2018-12-13 | Discharge: 2018-12-13 | Disposition: A | Discharge status: Home / Self Care | Payer: Self-pay | Attending: Emergency Medicine | Admitting: Emergency Medicine

## 2018-12-13 DIAGNOSIS — E860 Dehydration: Secondary | ICD-10-CM | POA: Insufficient documentation

## 2018-12-13 DIAGNOSIS — I10 Essential (primary) hypertension: Secondary | ICD-10-CM | POA: Insufficient documentation

## 2018-12-13 DIAGNOSIS — K852 Alcohol induced acute pancreatitis without necrosis or infection: Secondary | ICD-10-CM | POA: Insufficient documentation

## 2018-12-13 DIAGNOSIS — Z79899 Other long term (current) drug therapy: Secondary | ICD-10-CM | POA: Insufficient documentation

## 2018-12-13 LAB — URINALYSIS, ROUTINE W REFLEX MICROSCOPIC
Bilirubin Urine: NEGATIVE
Glucose, UA: 50 mg/dL — AB
Ketones, ur: 5 mg/dL — AB
LEUKOCYTE UA: NEGATIVE
Nitrite: NEGATIVE
Protein, ur: >=300 mg/dL — AB
Specific Gravity, Urine: 1.018 (ref 1.005–1.030)
pH: 5 (ref 5.0–8.0)

## 2018-12-13 LAB — COMPREHENSIVE METABOLIC PANEL
ALT: 32 U/L (ref 0–44)
AST: 125 U/L — ABNORMAL HIGH (ref 15–41)
Albumin: 5.2 g/dL — ABNORMAL HIGH (ref 3.5–5.0)
Alkaline Phosphatase: 73 U/L (ref 38–126)
Anion gap: 17 — ABNORMAL HIGH (ref 5–15)
BUN: 15 mg/dL (ref 6–20)
CHLORIDE: 100 mmol/L (ref 98–111)
CO2: 21 mmol/L — AB (ref 22–32)
Calcium: 10.1 mg/dL (ref 8.9–10.3)
Creatinine, Ser: 1.41 mg/dL — ABNORMAL HIGH (ref 0.44–1.00)
GFR calc Af Amer: 48 mL/min — ABNORMAL LOW (ref >60)
GFR calc non Af Amer: 41 mL/min — ABNORMAL LOW (ref >60)
Glucose, Bld: 180 mg/dL — ABNORMAL HIGH (ref 70–99)
POTASSIUM: 3.7 mmol/L (ref 3.5–5.1)
SODIUM: 138 mmol/L (ref 135–145)
Total Bilirubin: 0.9 mg/dL (ref 0.3–1.2)
Total Protein: 9.7 g/dL — ABNORMAL HIGH (ref 6.5–8.1)

## 2018-12-13 LAB — CBC WITH DIFFERENTIAL/PLATELET
Abs Immature Granulocytes: 0.08 10*3/uL — ABNORMAL HIGH (ref 0.00–0.07)
Basophils Absolute: 0 10*3/uL (ref 0.0–0.1)
Basophils Relative: 0 %
EOS PCT: 0 %
Eosinophils Absolute: 0 10*3/uL (ref 0.0–0.5)
HEMATOCRIT: 47.4 % — AB (ref 36.0–46.0)
Hemoglobin: 16.2 g/dL — ABNORMAL HIGH (ref 12.0–15.0)
Immature Granulocytes: 1 %
Lymphocytes Relative: 10 %
Lymphs Abs: 1.6 10*3/uL (ref 0.7–4.0)
MCH: 27 pg (ref 26.0–34.0)
MCHC: 34.2 g/dL (ref 30.0–36.0)
MCV: 79 fL — AB (ref 80.0–100.0)
Monocytes Absolute: 1.5 10*3/uL — ABNORMAL HIGH (ref 0.1–1.0)
Monocytes Relative: 9 %
NEUTROS ABS: 13.6 10*3/uL — AB (ref 1.7–7.7)
Neutrophils Relative %: 80 %
Platelets: 346 10*3/uL (ref 150–400)
RBC: 6 MIL/uL — ABNORMAL HIGH (ref 3.87–5.11)
RDW: 14.6 % (ref 11.5–15.5)
WBC: 16.9 10*3/uL — ABNORMAL HIGH (ref 4.0–10.5)
nRBC: 0 % (ref 0.0–0.2)

## 2018-12-13 LAB — LIPASE, BLOOD: Lipase: 114 U/L — ABNORMAL HIGH (ref 11–51)

## 2018-12-13 MED ORDER — ONDANSETRON HCL 4 MG PO TABS: 4.0000 mg | ORAL_TABLET | Freq: Four times a day (QID) | ORAL | 0 refills | Status: DC

## 2018-12-13 MED ORDER — SODIUM CHLORIDE (PF) 0.9 % IJ SOLN
INTRAMUSCULAR | Status: AC
  Filled 2018-12-13: qty 50

## 2018-12-13 MED ORDER — ONDANSETRON HCL 4 MG/2ML IJ SOLN
4.0000 mg | Freq: Once | INTRAMUSCULAR | Status: AC
  Administered 2018-12-13: 4 mg via INTRAVENOUS
  Filled 2018-12-13: qty 2

## 2018-12-13 MED ORDER — PROMETHAZINE HCL 25 MG/ML IJ SOLN
25.0000 mg | Freq: Once | INTRAMUSCULAR | Status: AC
  Administered 2018-12-13: 25 mg via INTRAVENOUS
  Filled 2018-12-13: qty 1

## 2018-12-13 MED ORDER — SODIUM CHLORIDE 0.9 % IV BOLUS (SEPSIS)
1000.0000 mL | Freq: Once | INTRAVENOUS | Status: AC
  Administered 2018-12-13: 1000 mL via INTRAVENOUS

## 2018-12-13 MED ORDER — HYDROCODONE-ACETAMINOPHEN 5-325 MG PO TABS: 1.0000 | ORAL_TABLET | ORAL | 0 refills | Status: AC | PRN

## 2018-12-13 MED ORDER — IOHEXOL 300 MG/ML  SOLN
75.0000 mL | Freq: Once | INTRAMUSCULAR | Status: AC | PRN
  Administered 2018-12-13: 75 mL via INTRAVENOUS

## 2018-12-13 MED ORDER — MORPHINE SULFATE (PF) 4 MG/ML IV SOLN
4.0000 mg | Freq: Once | INTRAVENOUS | Status: AC
  Administered 2018-12-13: 4 mg via INTRAVENOUS
  Filled 2018-12-13: qty 1

## 2018-12-13 NOTE — ED Notes (Signed)
Patient was able to take sips of ice water from cup. Patient stated she still feels sick.

## 2018-12-13 NOTE — ED Notes (Signed)
Patient transported to CT 

## 2018-12-13 NOTE — ED Triage Notes (Addendum)
Pt complains of diarrhea, nausea, weakness, generalized body aches x 2 days.

## 2018-12-13 NOTE — ED Provider Notes (Signed)
[facility] DEPT Provider Note   CSN: [id number] Arrival date & time: 12/13/18  0950    History   Chief Complaint Chief Complaint  Patient presents with   Diarrhea   Generalized Body Aches    HPI Angel Hughes is a 58 y.o. female.     Patient is a 57 year old African-American female with past medical history of bipolar disorder, hypertension, hyperlipidemia, obesity who presents emergency department for nausea, vomiting, diarrhea.  She reports that this started Friday night.  Reports nonbloody, watery stools.  Denies any fever,  dysuria, hematemesis.  Has not tried anything for relief.  Also reports that she is having cramps in her lower legs. Reports mild epigastric pain. Endorses drinking alcohol "a lot more than I should". Denies any known sick contacts.     Past Medical History:  Diagnosis Date   Allergic rhinitis    Anemia    Anxiety    Arthritis    Bipolar disorder ([place])    Depression    GERD (gastroesophageal reflux disease)    Hyperlipemia    Hypertension    pt states that she has no hx of elevated BP, on med for hot flashes   Obesity     Patient Active Problem List   Diagnosis Date Noted   Anxiety    Hypertension    Arthritis    Depression    Bipolar disorder ([place])     Past Surgical History:  Procedure Laterality Date   ABDOMINAL HYSTERECTOMY     CESAREAN SECTION     WRIST SURGERY     cyst   Z-PLASTY REPAIR       OB History    Gravida  1   Para  1   Term  1   Preterm      AB      Living  1     SAB      TAB      Ectopic      Multiple      Live Births               Home Medications    Prior to Admission medications   Medication Sig Start Date End Date Taking? Authorizing Provider  hydrOXYzine (VISTARIL) 25 MG capsule Take 25 mg by mouth 3 (three) times daily as needed for anxiety.    Yes [provider]  QUEtiapine (SEROQUEL) 400 MG tablet Take 2 tablets  (800 mg total) by mouth at bedtime. 09/17/13  Yes Theodis Blaze, MD  topiramate (TOPAMAX) 100 MG tablet Take 150 mg by mouth daily.   Yes [provider]  cloNIDine (CATAPRES) 0.1 MG tablet Take 0.1 mg by mouth 2 (two) times daily.    [provider]  HYDROcodone-acetaminophen (NORCO/VICODIN) 5-325 MG tablet Take 1-2 tablets by mouth every 4 (four) hours as needed for up to 3 days. 12/13/18 12/16/18  Madilyn Hook A, PA-C  ondansetron (ZOFRAN) 4 MG tablet Take 1 tablet (4 mg total) by mouth every 6 (six) hours. 12/13/18   Alveria Apley, PA-C    Family History Family History  Problem Relation Age of Onset   Diabetes Paternal Grandmother    Hypertension Paternal Grandmother    Diabetes Maternal Grandmother    Hypertension Maternal Grandmother    Diabetes Mother    Hypertension Mother    Diabetes Brother    Diabetes Sister    Heart disease Sister    Hypertension Sister     Social History Social  History   Tobacco Use   Smoking status: Current Every Day Smoker    Packs/day: 0.50    Years: 34.00    Pack years: 17.00   Smokeless tobacco: Never Used  Substance Use Topics   Alcohol use: Yes    Alcohol/week: 0.0 standard drinks    Comment: occassionally   Drug use: No     Allergies   Patient has no known allergies.   Review of Systems Review of Systems  Constitutional: Negative for activity change, appetite change, chills, fatigue and fever.  HENT: Negative for ear pain, rhinorrhea, sinus pain and sore throat.   Eyes: Negative for pain and visual disturbance.  Respiratory: Negative for cough and shortness of breath.   Cardiovascular: Negative for chest pain and palpitations.  Gastrointestinal: Positive for diarrhea, nausea and vomiting. Negative for abdominal pain and anal bleeding.  Genitourinary: Negative for dysuria and hematuria.  Musculoskeletal: Negative for arthralgias and back pain.  Skin: Negative for color change and rash.    Neurological: Negative for dizziness, seizures and syncope.  All other systems reviewed and are negative.    Physical Exam Updated Vital Signs BP (!) 158/125 (BP Location: Left Arm)    Pulse (!) 107    Temp 98.1 F (36.7 C) (Oral)    Resp 20    SpO2 100%   Physical Exam Vitals signs and nursing note reviewed.  HENT:     Head: Normocephalic and atraumatic.     Nose: Nose normal.     Mouth/Throat:     Mouth: Mucous membranes are moist.  Eyes:     Conjunctiva/sclera: Conjunctivae normal.     Pupils: Pupils are equal, round, and reactive to light.  Cardiovascular:     Rate and Rhythm: Normal rate and regular rhythm.  Pulmonary:     Effort: Pulmonary effort is normal.     Breath sounds: Normal breath sounds. No wheezing, rhonchi or rales.  Abdominal:     General: Abdomen is flat. Bowel sounds are normal. There is no distension.     Tenderness: There is no abdominal tenderness. There is no right CVA tenderness, left CVA tenderness or guarding.  Musculoskeletal:     Right lower leg: No edema.     Left lower leg: No edema.  Skin:    General: Skin is warm.     Capillary Refill: Capillary refill takes less than 2 seconds.  Neurological:     General: No focal deficit present.     Mental Status: She is alert.  Psychiatric:        Mood and Affect: Mood normal.      ED Treatments / Results  Labs (all labs ordered are listed, but only abnormal results are displayed) Labs Reviewed  COMPREHENSIVE METABOLIC PANEL - Abnormal; Notable for the following components:      Result Value   CO2 21 (*)    Glucose, Bld 180 (*)    Creatinine, Ser 1.41 (*)    Total Protein 9.7 (*)    Albumin 5.2 (*)    AST 125 (*)    GFR calc non Af Amer 41 (*)    GFR calc Af Amer 48 (*)    Anion gap 17 (*)    All other components within normal limits  LIPASE, BLOOD - Abnormal; Notable for the following components:   Lipase 114 (*)    All other components within normal limits  CBC WITH  DIFFERENTIAL/PLATELET - Abnormal; Notable for the following components:   WBC 16.9 (*)  RBC 6.00 (*)    Hemoglobin 16.2 (*)    HCT 47.4 (*)    MCV 79.0 (*)    Neutro Abs 13.6 (*)    Monocytes Absolute 1.5 (*)    Abs Immature Granulocytes 0.08 (*)    All other components within normal limits  URINALYSIS, ROUTINE W REFLEX MICROSCOPIC - Abnormal; Notable for the following components:   Color, Urine AMBER (*)    APPearance CLOUDY (*)    Glucose, UA 50 (*)    Hgb urine dipstick MODERATE (*)    Ketones, ur 5 (*)    Protein, ur >=300 (*)    Bacteria, UA FEW (*)    All other components within normal limits  URINE CULTURE    EKG None  Radiology Ct Abdomen Pelvis W Contrast  Result Date: 12/13/2018 CLINICAL DATA:  58 year old female with acute abdominal and pelvic pain, nausea and vomiting. EXAM: CT ABDOMEN AND PELVIS WITH CONTRAST TECHNIQUE: Multidetector CT imaging of the abdomen and pelvis was performed using the standard protocol following bolus administration of intravenous contrast. CONTRAST:  68mL OMNIPAQUE IOHEXOL 300 MG/ML  SOLN COMPARISON:  None. FINDINGS: Lower chest: No acute abnormality Hepatobiliary: The liver and gallbladder are unremarkable. No biliary dilatation. Pancreas: Unremarkable Spleen: Unremarkable Adrenals/Urinary Tract: The kidneys, adrenal glands and bladder are unremarkable except for tiny probable bilateral renal cysts. Stomach/Bowel: Stomach is within normal limits. Appendix appears normal. No evidence of bowel wall thickening, distention, or inflammatory changes. Vascular/Lymphatic: No significant vascular findings are present. No enlarged abdominal or pelvic lymph nodes. Reproductive: Status post hysterectomy. No adnexal masses. Other: No ascites, focal collection, or pneumoperitoneum. Musculoskeletal: No acute or suspicious bony abnormality. Moderate degenerative disc disease at L5-S1 noted. IMPRESSION: 1. No acute abnormality 2. Moderate degenerative disc  disease at L5-S1. Electronically Signed   By: Margarette Canada M.D.   On: 12/13/2018 13:13    Procedures Procedures (including critical care time)  Medications Ordered in ED Medications  sodium chloride (PF) 0.9 % injection (has no administration in time range)  ondansetron (ZOFRAN) injection 4 mg (has no administration in time range)  morphine 4 MG/ML injection 4 mg (has no administration in time range)  sodium chloride 0.9 % bolus 1,000 mL (1,000 mLs Intravenous New Bag/Given 12/13/18 1053)  ondansetron (ZOFRAN) injection 4 mg (4 mg Intravenous Given 12/13/18 1051)  sodium chloride 0.9 % bolus 1,000 mL (0 mLs Intravenous Stopped 12/13/18 1148)  promethazine (PHENERGAN) injection 25 mg (25 mg Intravenous Given 12/13/18 1145)  iohexol (OMNIPAQUE) 300 MG/ML solution 75 mL (75 mLs Intravenous Contrast Given 12/13/18 1225)     Initial Impression / Assessment and Plan / ED Course  I have reviewed the triage vital signs and the nursing notes.  Pertinent labs & imaging results that were available during my care of the patient were reviewed by me and considered in my medical decision making (see chart for details).  Clinical Course as of Dec 13 1342  Sun Dec 13, 2018  1341 Patient has mild pancreatitis, likely alcohol induced, without evidence of necrosis or inflammation on CT. She is afebrile and tolerating PO. Qill send home and treat with clear liquid diet, antiemetics and pain medication.  I discussed treatment options for the patients pain to include narcotic and non-narcotic medications. I discussed the risks of narcotic medications in full detail to include overdose and addiction. Patient verbalized full understanding of risks of narcotic medication but still insisted to take rx for narcotic pain medication due to the severity of their  pain.  Prior to providing a prescription for a controlled substance, I independently reviewed the patient's recent prescription history on the [facility]. The patient had no recent or regular prescriptions and was deemed appropriate for a brief, less than 3 day prescription of narcotic for acute analgesia.     [KM]    Clinical Course User Index [KM] Alveria Apley, PA-C       Based on review of vitals, medical screening exam, lab work and/or imaging, there does not appear to be an acute, emergent etiology for the patient's symptoms. Counseled pt on good return precautions and encouraged both PCP and ED follow-up as needed.  Prior to discharge, I also discussed incidental imaging findings with patient in detail and advised appropriate, recommended follow-up in detail.  Clinical Impression: 1. Alcohol-induced acute pancreatitis without infection or necrosis     Disposition: Discharge  Prior to providing a prescription for a controlled substance, I independently reviewed the patient's recent prescription history on the [facility]. The patient had no recent or regular prescriptions and was deemed appropriate for a brief, less than 3 day prescription of narcotic for acute analgesia.  This note was prepared with assistance of Systems analyst. Occasional wrong-word or sound-a-like substitutions may have occurred due to the inherent limitations of voice recognition software.   Final Clinical Impressions(s) / ED Diagnoses   Final diagnoses:  Alcohol-induced acute pancreatitis without infection or necrosis    ED Discharge Orders         Ordered    HYDROcodone-acetaminophen (NORCO/VICODIN) 5-325 MG tablet  Every 4 hours PRN     12/13/18 1344    ondansetron (ZOFRAN) 4 MG tablet  Every 6 hours     12/13/18 1344           Kristine Royal 12/13/18 1344    Lacretia Leigh, MD 12/15/18 [phone]

## 2018-12-13 NOTE — Discharge Instructions (Signed)
Stop drinking alcohol. Thank you for allowing me to care for you today. Please return to the emergency department if you have new or worsening symptoms. Take your medications as instructed.

## 2018-12-13 NOTE — ED Notes (Signed)
Patient given discharge teaching and verbalized understanding. Patient ambulated out of ED with a steady gait. 

## 2018-12-13 NOTE — ED Notes (Signed)
Patient ambulated from triage to room 7.

## 2018-12-15 LAB — URINE CULTURE: Culture: >=100000 — AB

## 2018-12-16 ENCOUNTER — Telehealth: Payer: Self-pay

## 2018-12-16 NOTE — Telephone Encounter (Signed)
No treatment for [facility] ED 12/13/2018 per [facility] D

## 2018-12-16 NOTE — Progress Notes (Signed)
ED Antimicrobial Stewardship Positive Culture Follow Up   Angel Hughes is an 58 y.o. female who presented to [facility] on 12/13/2018 with a chief complaint of diarrhea, nausea, weakness, generalized body aches x 2 days.  Chief Complaint  Patient presents with  . Diarrhea  . Generalized Body Aches    Recent Results (from the past 720 hour(s))  Urine culture     Status: Abnormal   Collection Time: 12/13/18 10:33 AM  Result Value Ref Range Status   Specimen Description   Final    URINE, RANDOM Performed at [facility] [address], [place], [place] [address]    Special Requests   Final    NONE Performed at [facility], [place] [address], [place], [place] [address]    Culture >=100,000 COLONIES/mL ESCHERICHIA COLI (A)  Final   Report Status 12/15/2018 FINAL  Final   Organism ID, Bacteria ESCHERICHIA COLI (A)  Final      Susceptibility   Escherichia coli - MIC*    AMPICILLIN >=32 RESISTANT Resistant     CEFAZOLIN <=4 SENSITIVE Sensitive     CEFTRIAXONE <=1 SENSITIVE Sensitive     CIPROFLOXACIN <=0.25 SENSITIVE Sensitive     GENTAMICIN <=1 SENSITIVE Sensitive     IMIPENEM <=0.25 SENSITIVE Sensitive     NITROFURANTOIN <=16 SENSITIVE Sensitive     TRIMETH/SULFA >=320 RESISTANT Resistant     AMPICILLIN/SULBACTAM >=32 RESISTANT Resistant     PIP/TAZO <=4 SENSITIVE Sensitive     Extended ESBL NEGATIVE Sensitive     * >=100,000 COLONIES/mL ESCHERICHIA COLI    []  Treated with ____, organism resistant to prescribed antimicrobial [x]  Patient discharged originally without antimicrobial agent   New antibiotic prescription: not needed, likely asymptomatic bacteruria. ED provider notes explicitly states that pt denied urinary symptoms  ED Provider: Debroah Baller, NP     Royetta Asal, PharmD, BCPS Pager [phone] 12/16/2018 2:20 PM

## 2019-08-06 ENCOUNTER — Other Ambulatory Visit (HOSPITAL_COMMUNITY): Payer: Self-pay | Admitting: *Deleted

## 2019-08-06 DIAGNOSIS — Z1231 Encounter for screening mammogram for malignant neoplasm of breast: Secondary | ICD-10-CM

## 2019-10-14 ENCOUNTER — Ambulatory Visit (HOSPITAL_COMMUNITY): Payer: Medicaid Other

## 2019-10-17 ENCOUNTER — Emergency Department (HOSPITAL_COMMUNITY)
Admission: EM | Admit: 2019-10-17 | Discharge: 2019-10-18 | Disposition: A | Discharge status: Home / Self Care | Payer: Self-pay | Attending: Emergency Medicine | Admitting: Emergency Medicine

## 2019-10-17 ENCOUNTER — Emergency Department (HOSPITAL_COMMUNITY): Payer: Self-pay

## 2019-10-17 ENCOUNTER — Other Ambulatory Visit: Payer: Self-pay

## 2019-10-17 ENCOUNTER — Encounter (HOSPITAL_COMMUNITY): Payer: Self-pay | Admitting: Emergency Medicine

## 2019-10-17 DIAGNOSIS — R809 Proteinuria, unspecified: Secondary | ICD-10-CM | POA: Insufficient documentation

## 2019-10-17 DIAGNOSIS — F17210 Nicotine dependence, cigarettes, uncomplicated: Secondary | ICD-10-CM | POA: Insufficient documentation

## 2019-10-17 DIAGNOSIS — I10 Essential (primary) hypertension: Secondary | ICD-10-CM | POA: Insufficient documentation

## 2019-10-17 DIAGNOSIS — Z79899 Other long term (current) drug therapy: Secondary | ICD-10-CM | POA: Insufficient documentation

## 2019-10-17 DIAGNOSIS — N3001 Acute cystitis with hematuria: Secondary | ICD-10-CM | POA: Insufficient documentation

## 2019-10-17 LAB — CBC
HCT: 43.3 % (ref 36.0–46.0)
Hemoglobin: 15.1 g/dL — ABNORMAL HIGH (ref 12.0–15.0)
MCH: 26.2 pg (ref 26.0–34.0)
MCHC: 34.9 g/dL (ref 30.0–36.0)
MCV: 75 fL — ABNORMAL LOW (ref 80.0–100.0)
Platelets: 341 10*3/uL (ref 150–400)
RBC: 5.77 MIL/uL — ABNORMAL HIGH (ref 3.87–5.11)
RDW: 13.8 % (ref 11.5–15.5)
WBC: 11.4 10*3/uL — ABNORMAL HIGH (ref 4.0–10.5)
nRBC: 0 % (ref 0.0–0.2)

## 2019-10-17 LAB — URINALYSIS, ROUTINE W REFLEX MICROSCOPIC
Bilirubin Urine: NEGATIVE
Glucose, UA: NEGATIVE mg/dL
Ketones, ur: 5 mg/dL — AB
Nitrite: NEGATIVE
Protein, ur: >=300 mg/dL — AB
RBC / HPF: >50 RBC/hpf — ABNORMAL HIGH (ref 0–5)
Specific Gravity, Urine: 1.015 (ref 1.005–1.030)
WBC, UA: >50 WBC/hpf — ABNORMAL HIGH (ref 0–5)
pH: 5 (ref 5.0–8.0)

## 2019-10-17 LAB — COMPREHENSIVE METABOLIC PANEL
ALT: 23 U/L (ref 0–44)
AST: 23 U/L (ref 15–41)
Albumin: 5.2 g/dL — ABNORMAL HIGH (ref 3.5–5.0)
Alkaline Phosphatase: 62 U/L (ref 38–126)
Anion gap: 13 (ref 5–15)
BUN: 16 mg/dL (ref 6–20)
CO2: 22 mmol/L (ref 22–32)
Calcium: 10.2 mg/dL (ref 8.9–10.3)
Chloride: 103 mmol/L (ref 98–111)
Creatinine, Ser: 1.3 mg/dL — ABNORMAL HIGH (ref 0.44–1.00)
GFR calc Af Amer: 52 mL/min — ABNORMAL LOW (ref >60)
GFR calc non Af Amer: 45 mL/min — ABNORMAL LOW (ref >60)
Glucose, Bld: 142 mg/dL — ABNORMAL HIGH (ref 70–99)
Potassium: 3.8 mmol/L (ref 3.5–5.1)
Sodium: 138 mmol/L (ref 135–145)
Total Bilirubin: 1 mg/dL (ref 0.3–1.2)
Total Protein: 9.1 g/dL — ABNORMAL HIGH (ref 6.5–8.1)

## 2019-10-17 LAB — LIPASE, BLOOD: Lipase: 54 U/L — ABNORMAL HIGH (ref 11–51)

## 2019-10-17 LAB — I-STAT BETA HCG BLOOD, ED (MC, WL, AP ONLY): I-stat hCG, quantitative: <5 m[IU]/mL (ref <5)

## 2019-10-17 MED ORDER — SODIUM CHLORIDE 0.9 % IV BOLUS
1000.0000 mL | Freq: Once | INTRAVENOUS | Status: AC
  Administered 2019-10-17: 1000 mL via INTRAVENOUS

## 2019-10-17 MED ORDER — SODIUM CHLORIDE 0.9 % IV SOLN
1.0000 g | Freq: Once | INTRAVENOUS | Status: AC
  Administered 2019-10-17: 1 g via INTRAVENOUS
  Filled 2019-10-17: qty 10

## 2019-10-17 MED ORDER — KETOROLAC TROMETHAMINE 30 MG/ML IJ SOLN
30.0000 mg | Freq: Once | INTRAMUSCULAR | Status: AC
  Administered 2019-10-17: 30 mg via INTRAVENOUS
  Filled 2019-10-17: qty 1

## 2019-10-17 MED ORDER — ONDANSETRON HCL 4 MG/2ML IJ SOLN
4.0000 mg | Freq: Once | INTRAMUSCULAR | Status: AC
  Administered 2019-10-17: 4 mg via INTRAVENOUS
  Filled 2019-10-17: qty 2

## 2019-10-17 NOTE — ED Provider Notes (Signed)
[place] DEPT Provider Note   CSN: [id number] Arrival date & time: 10/17/19  1624     History Chief Complaint  Patient presents with  . Nausea  . Abdominal Pain    Angel Hughes is a 59 y.o. female with a history of bipolar disorder, HLD, HTN, and anxiety who presents to the emergency department with a chief complaint of nausea, vomiting, abdominal pain, and right flank pain.  The patient endorses countless episodes of nonbloody, nonbilious vomiting that began yesterday accompanied by nausea, lower abdominal pain that will intermittently radiate to the right flank.  No known aggravating or alleviating factors.  She characterizes the pain as a cramping.  She has also noticed a strong odor to her urine over the last few days.  She denies hematuria, dysuria, or urinary hesitancy or frequency.  No fever, chills, vaginal pain or discharge, diarrhea, constipation, chest pain, shortness of breath, or cough.    She is concerned that her symptoms may be due to a panic attack.  States that she has had nausea and vomiting with previous panic attacks.  Reports that she has lost a brother and a sister over the last year.  She is also lost her job and had to place her mother in an assisted living facility.  She denies SI, HI, or auditory visual hallucinations.  No treatment prior to arrival.  No history of kidney stones.  Surgical history includes cesarean section and hysterectomy.  The history is provided by the patient. No language interpreter was used.       Past Medical History:  Diagnosis Date  . Allergic rhinitis   . Anemia   . Anxiety   . Arthritis   . Bipolar disorder ([place])   . Depression   . GERD (gastroesophageal reflux disease)   . Hyperlipemia   . Hypertension    pt states that she has no hx of elevated BP, on med for hot flashes  . Obesity     Patient Active Problem List   Diagnosis Date Noted  . Anxiety   . Hypertension   . Arthritis     . Depression   . Bipolar disorder [facility])     Past Surgical History:  Procedure Laterality Date  . ABDOMINAL HYSTERECTOMY    . CESAREAN SECTION    . WRIST SURGERY     cyst  . Z-PLASTY REPAIR       OB History    Gravida  1   Para  1   Term  1   Preterm      AB      Living  1     SAB      TAB      Ectopic      Multiple      Live Births              Family History  Problem Relation Age of Onset  . Diabetes Paternal Grandmother   . Hypertension Paternal Grandmother   . Diabetes Maternal Grandmother   . Hypertension Maternal Grandmother   . Diabetes Mother   . Hypertension Mother   . Diabetes Brother   . Diabetes Sister   . Heart disease Sister   . Hypertension Sister     Social History   Tobacco Use  . Smoking status: Current Every Day Smoker    Packs/day: 0.50    Years: 34.00    Pack years: 17.00  . Smokeless tobacco: Never Used  Substance Use Topics  .  Alcohol use: Yes    Alcohol/week: 0.0 standard drinks    Comment: occassionally  . Drug use: No    Home Medications Prior to Admission medications   Medication Sig Start Date End Date Taking? Authorizing Provider  cephALEXin (KEFLEX) 500 MG capsule Take 1 capsule (500 mg total) by mouth 4 (four) times daily. 10/18/19   Shanay Woolman A, PA-C  cloNIDine (CATAPRES) 0.1 MG tablet Take 0.1 mg by mouth 2 (two) times daily.    [provider]  hydrOXYzine (VISTARIL) 25 MG capsule Take 25 mg by mouth 3 (three) times daily as needed for anxiety.     [provider]  ondansetron (ZOFRAN ODT) 4 MG disintegrating tablet Take 1 tablet (4 mg total) by mouth every 8 (eight) hours as needed. 10/18/19   Demetrice Combes A, PA-C  ondansetron (ZOFRAN) 4 MG tablet Take 1 tablet (4 mg total) by mouth every 6 (six) hours. 12/13/18   Alveria Apley, PA-C  QUEtiapine (SEROQUEL) 400 MG tablet Take 2 tablets (800 mg total) by mouth at bedtime. 09/17/13   Theodis Blaze, MD  topiramate (TOPAMAX) 100 MG  tablet Take 150 mg by mouth daily.    [provider]    Allergies    Patient has no known allergies.  Review of Systems   Review of Systems  Constitutional: Negative for activity change, chills and fever.  Respiratory: Negative for shortness of breath.   Cardiovascular: Negative for chest pain.  Gastrointestinal: Positive for abdominal pain, nausea and vomiting. Negative for anal bleeding, blood in stool, constipation, diarrhea and rectal pain.  Genitourinary: Positive for flank pain. Negative for dysuria, frequency, hematuria, pelvic pain, urgency, vaginal bleeding, vaginal discharge and vaginal pain.       Malodorous urine  Musculoskeletal: Negative for back pain.  Skin: Negative for rash.  Allergic/Immunologic: Negative for immunocompromised state.  Neurological: Negative for seizures, syncope, weakness, numbness and headaches.  Psychiatric/Behavioral: Negative for confusion.    Physical Exam Updated Vital Signs BP 134/79   Pulse 77   Temp 98.3 F (36.8 C) (Oral)   Resp 17   Ht 5\' 3"  (1.6 m)   Wt 79.4 kg   SpO2 97%   BMI 31.00 kg/m   Physical Exam Vitals and nursing note reviewed.  Constitutional:      General: She is not in acute distress.    Appearance: She is not ill-appearing, toxic-appearing or diaphoretic.     Comments: Well-appearing.  No acute distress.  HENT:     Head: Normocephalic.  Eyes:     Conjunctiva/sclera: Conjunctivae normal.  Cardiovascular:     Rate and Rhythm: Normal rate and regular rhythm.     Heart sounds: No murmur. No friction rub. No gallop.   Pulmonary:     Effort: Pulmonary effort is normal. No respiratory distress.     Breath sounds: No stridor. No wheezing, rhonchi or rales.  Chest:     Chest wall: No tenderness.  Abdominal:     General: There is no distension.     Palpations: Abdomen is soft. There is no mass.     Tenderness: There is abdominal tenderness. There is right CVA tenderness. There is no left CVA  tenderness, guarding or rebound.     Hernia: No hernia is present.     Comments: Tender to palpation in the suprapubic region.  No rebound or guarding.  There is right CVA tenderness.  No left CVA tenderness.  No tenderness over McBurney's point.  Negative Percell Miller  sign.  Normoactive bowel sounds.  There is a well-healed scar noted to the suprapubic region.  Musculoskeletal:     Cervical back: Neck supple.  Skin:    General: Skin is warm.     Findings: No rash.  Neurological:     Mental Status: She is alert.  Psychiatric:        Behavior: Behavior normal.     ED Results / Procedures / Treatments   Labs (all labs ordered are listed, but only abnormal results are displayed) Labs Reviewed  LIPASE, BLOOD - Abnormal; Notable for the following components:      Result Value   Lipase 54 (*)    All other components within normal limits  COMPREHENSIVE METABOLIC PANEL - Abnormal; Notable for the following components:   Glucose, Bld 142 (*)    Creatinine, Ser 1.30 (*)    Total Protein 9.1 (*)    Albumin 5.2 (*)    GFR calc non Af Amer 45 (*)    GFR calc Af Amer 52 (*)    All other components within normal limits  CBC - Abnormal; Notable for the following components:   WBC 11.4 (*)    RBC 5.77 (*)    Hemoglobin 15.1 (*)    MCV 75.0 (*)    All other components within normal limits  URINALYSIS, ROUTINE W REFLEX MICROSCOPIC - Abnormal; Notable for the following components:   Color, Urine AMBER (*)    APPearance CLOUDY (*)    Hgb urine dipstick MODERATE (*)    Ketones, ur 5 (*)    Protein, ur >=300 (*)    Leukocytes,Ua LARGE (*)    RBC / HPF >50 (*)    WBC, UA >50 (*)    Bacteria, UA MANY (*)    All other components within normal limits  URINE CULTURE  I-STAT BETA HCG BLOOD, ED ([facility], [facility], [facility] ONLY)    EKG None  Radiology CT Renal Stone Study  Result Date: 10/17/2019 CLINICAL DATA:  Hematuria. Abdominal pain and vomiting. Patient reports right flank pain. EXAM: CT ABDOMEN AND  PELVIS WITHOUT CONTRAST TECHNIQUE: Multidetector CT imaging of the abdomen and pelvis was performed following the standard protocol without IV contrast. COMPARISON:  CT 12/13/2018 FINDINGS: Lower chest: Lung bases are clear. Hepatobiliary: No focal liver abnormality is seen. No gallstones, gallbladder wall thickening, or biliary dilatation. Pancreas: No ductal dilatation or inflammation. Spleen: Normal in size without focal abnormality. Adrenals/Urinary Tract: Normal adrenal glands. No renal stones or hydronephrosis. No significant perinephric edema. No evidence of focal renal mass on noncontrast exam. Both ureters are decompressed without stones along the course. Urinary bladder is nondistended and not well evaluated. No bladder stone. Stomach/Bowel: Tiny hiatal hernia. Stomach is unremarkable. No bowel obstruction or inflammatory change. Normal appendix, series 5, image 65. No terminal ileal inflammation. Moderate volume of stool in the proximal colon, small volume of stool in the more distal colon. Vascular/Lymphatic: Mild aortic atherosclerosis. No aortic aneurysm. No enlarged lymph nodes in the abdomen or pelvis. Reproductive: Status post hysterectomy. No adnexal masses. Other: No free air or ascites. Postsurgical change of the lower anterior abdominal wall. Tiny fat containing umbilical hernia. Musculoskeletal: Degenerative disc disease at L5-S1. There are no acute or suspicious osseous abnormalities. IMPRESSION: 1. No renal stones or obstructive uropathy. No explanation for hematuria. Consider follow-up hematuria protocol CT on an elective basis should symptoms persist. 2. Tiny hiatal hernia. Electronically Signed   By: Keith Rake M.D.   On: 10/17/2019 23:56  Procedures Procedures (including critical care time)  Medications Ordered in ED Medications  sodium chloride 0.9 % bolus 1,000 mL (0 mLs Intravenous Stopped 10/18/19 0038)  ondansetron (ZOFRAN) injection 4 mg (4 mg Intravenous Given  10/17/19 2350)  ketorolac (TORADOL) 30 MG/ML injection 30 mg (30 mg Intravenous Given 10/17/19 2350)  cefTRIAXone (ROCEPHIN) 1 g in sodium chloride 0.9 % 100 mL IVPB (0 g Intravenous Stopped 10/18/19 0030)  acetaminophen (TYLENOL) tablet 650 mg (650 mg Oral Given 10/18/19 0041)    ED Course  I have reviewed the triage vital signs and the nursing notes.  Pertinent labs & imaging results that were available during my care of the patient were reviewed by me and considered in my medical decision making (see chart for details).    MDM Rules/Calculators/A&P                      59 year old female with a history of bipolar disorder, HLD, HTN, and anxiety presenting with nausea, vomiting, suprapubic, and right flank pain, onset yesterday.  No constitutional symptoms.  UA is concerning for infection with WBC clumps, pyuria, RBCs, and hemoglobinuria.  Of note, the patient also has greater than 300 proteinuria that was also noted on a previous urinalysis in March 2020.  We will send urine for culture.  Given hemoglobinuria and RBCs, will also order CT stone study.  CT stone study is unremarkable.  Patient reports improvement with symptoms following Toradol and Zofran.  She was excessively fluid challenge.  Doubt pyelonephritis, STI, urosepsis, appendicitis, ovarian torsion, or cholecystitis.   Given proteinuria, will plate transitions of care consult to ensure the patient has follow-up to make sure proteinuria resolves.  I suspect that her creatinine was minimally elevated today secondary to dehydration from vomiting, but also want to ensure creatinine is not gradually worsening in the setting of persistent proteinuria.  All questions answered.  She is hemodynamically stable and in no acute distress.  ER return precautions given.  Safe for discharge to home with outpatient follow-up as indicated.  Final Clinical Impression(s) / ED Diagnoses Final diagnoses:  Acute cystitis with hematuria  Proteinuria,  unspecified type    Rx / DC Orders ED Discharge Orders         Ordered    cephALEXin (KEFLEX) 500 MG capsule  4 times daily     10/18/19 0133    ondansetron (ZOFRAN ODT) 4 MG disintegrating tablet  Every 8 hours PRN     10/18/19 0133           Blayke Cordrey A, PA-C 10/18/19 0210    Drenda Freeze, MD 10/20/19 [phone]

## 2019-10-17 NOTE — ED Notes (Signed)
Attempted to collect labs and was unsuccessful

## 2019-10-17 NOTE — ED Triage Notes (Signed)
Patient endorses emesis and abdominal pain since yesterday, has not tried anything for her emesis, endorses 10/10 abd pain, describes it as cramping. Stated "it is just a bad anxiety attack."

## 2019-10-17 NOTE — ED Notes (Signed)
Urine specimen too small to obtain culture.

## 2019-10-18 MED ORDER — CEPHALEXIN 500 MG PO CAPS: 500.0000 mg | ORAL_CAPSULE | Freq: Four times a day (QID) | ORAL | 0 refills | Status: DC

## 2019-10-18 MED ORDER — ACETAMINOPHEN 325 MG PO TABS
650.0000 mg | ORAL_TABLET | Freq: Once | ORAL | Status: AC
  Administered 2019-10-18: 650 mg via ORAL
  Filled 2019-10-18: qty 2

## 2019-10-18 MED ORDER — ONDANSETRON 4 MG PO TBDP: 4.0000 mg | ORAL_TABLET | Freq: Three times a day (TID) | ORAL | 0 refills | Status: DC | PRN

## 2019-10-18 NOTE — Discharge Instructions (Addendum)
Thank you for allowing me to care for you today in the Emergency Department.   You were seen today in the emergency department for abdominal pain, nausea, and vomiting.  You were found to have a urinary tract infection.  You were given 1 dose of antibiotics in the ER and Zofran for nausea and vomiting.  Keflex is an antibiotic.  Take 1 tablet by mouth every 6 hours for the next 5 days for your infection.  Make sure to take the full course of antibiotics.  Let 1 tablet of Zofran dissolve under your tongue every 8 hours as needed for nausea and vomiting.  You can take 650 mg of Tylenol once every 6 hours for pain.  I would try to avoid NSAIDs, such as ibuprofen or Aleve as your kidney function was slightly elevated today.  Our social worker should be reaching out to you to try and help coordinate follow-up since you had protein in your urine today.  I have also included the numbers of the 2 clinics that are associated with the hospital system that we will see patients that currently do not have medical insurance.  You can also attempt a call them yourself.  I would like them to repeat a urinalysis to make sure the protein in your urine resolves after you have finished your antibiotics.  If you develop high fevers, persistent vomiting despite taking Zofran, if you stop making urine, or develop other new, concerning symptoms, please return to the emergency department for further evaluation.

## 2019-10-19 ENCOUNTER — Telehealth: Payer: Self-pay | Admitting: *Deleted

## 2019-10-19 LAB — URINE CULTURE: Culture: >=100000 — AB

## 2019-10-19 NOTE — Telephone Encounter (Signed)
TOC CM scheduled appt at [facility] appt at 11/10/2019 at 2:30 pm. Attempted call to pt and left message for return call. ED provided updated. [place], [place] ED TOC CM [phone]

## 2019-10-20 ENCOUNTER — Telehealth: Payer: Self-pay | Admitting: *Deleted

## 2019-10-20 NOTE — Telephone Encounter (Signed)
Pt called for appointment information.  [facility] relayed appointment time, date and address to pt.

## 2019-11-10 ENCOUNTER — Encounter (INDEPENDENT_AMBULATORY_CARE_PROVIDER_SITE_OTHER): Payer: Self-pay | Admitting: Primary Care

## 2019-11-10 ENCOUNTER — Other Ambulatory Visit: Payer: Self-pay

## 2019-11-10 ENCOUNTER — Ambulatory Visit (INDEPENDENT_AMBULATORY_CARE_PROVIDER_SITE_OTHER): Payer: Self-pay | Admitting: Primary Care

## 2019-11-10 VITALS — BP 133/81 | HR 99 | Temp 97.5°F | Ht 63.0 in | Wt 179.6 lb

## 2019-11-10 DIAGNOSIS — Z87898 Personal history of other specified conditions: Secondary | ICD-10-CM

## 2019-11-10 DIAGNOSIS — Z7689 Persons encountering health services in other specified circumstances: Secondary | ICD-10-CM

## 2019-11-10 DIAGNOSIS — Z720 Tobacco use: Secondary | ICD-10-CM

## 2019-11-10 DIAGNOSIS — E119 Type 2 diabetes mellitus without complications: Secondary | ICD-10-CM

## 2019-11-10 DIAGNOSIS — R030 Elevated blood-pressure reading, without diagnosis of hypertension: Secondary | ICD-10-CM

## 2019-11-10 DIAGNOSIS — Z23 Encounter for immunization: Secondary | ICD-10-CM

## 2019-11-10 LAB — POCT GLYCOSYLATED HEMOGLOBIN (HGB A1C): Hemoglobin A1C: 6.8 % — AB (ref 4.0–5.6)

## 2019-11-10 NOTE — Progress Notes (Signed)
New Patient Office Visit  Subjective:  Patient ID: Angel Hughes, female    DOB: [birth year]  Age: 59 y.o. MRN: [id number]  CC: No chief complaint on file.   HPI Angel Hughes presents for establishment of care , diabetes screening and hypertension. Bipolar is managed by [facility] and they will continuing prescribing her medication.   Past Medical History:  Diagnosis Date  . Allergic rhinitis   . Anemia   . Anxiety   . Arthritis   . Bipolar disorder ([place])   . Depression   . GERD (gastroesophageal reflux disease)   . Hyperlipemia   . Hypertension    pt states that she has no hx of elevated BP, on med for hot flashes  . Obesity     Past Surgical History:  Procedure Laterality Date  . ABDOMINAL HYSTERECTOMY    . CESAREAN SECTION    . WRIST SURGERY     cyst  . Z-PLASTY REPAIR      Family History  Problem Relation Age of Onset  . Diabetes Paternal Grandmother   . Hypertension Paternal Grandmother   . Diabetes Maternal Grandmother   . Hypertension Maternal Grandmother   . Diabetes Mother   . Hypertension Mother   . Diabetes Brother   . Diabetes Sister   . Heart disease Sister   . Hypertension Sister     Social History   Socioeconomic History  . Marital status: Legally Separated    Spouse name: Not on file  . Number of children: Not on file  . Years of education: Not on file  . Highest education level: Not on file  Occupational History  . Not on file  Tobacco Use  . Smoking status: Current Every Day Smoker    Packs/day: 0.50    Years: 34.00    Pack years: 17.00  . Smokeless tobacco: Never Used  Substance and Sexual Activity  . Alcohol use: Yes    Alcohol/week: 0.0 standard drinks    Comment: occassionally  . Drug use: No  . Sexual activity: Not on file  Other Topics Concern  . Not on file  Social History Narrative  . Not on file   Social Determinants of Health   Financial Resource Strain:   . Difficulty of Paying Living Expenses: Not on file   Food Insecurity:   . Worried About Charity fundraiser in the Last Year: Not on file  . Ran Out of Food in the Last Year: Not on file  Transportation Needs:   . Lack of Transportation (Medical): Not on file  . Lack of Transportation (Non-Medical): Not on file  Physical Activity:   . Days of Exercise per Week: Not on file  . Minutes of Exercise per Session: Not on file  Stress:   . Feeling of Stress : Not on file  Social Connections:   . Frequency of Communication with Friends and Family: Not on file  . Frequency of Social Gatherings with Friends and Family: Not on file  . Attends Religious Services: Not on file  . Active Member of Clubs or Organizations: Not on file  . Attends Archivist Meetings: Not on file  . Marital Status: Not on file  Intimate Partner Violence:   . Fear of Current or Ex-Partner: Not on file  . Emotionally Abused: Not on file  . Physically Abused: Not on file  . Sexually Abused: Not on file    ROS Review of Systems  All other systems reviewed and are  negative.   Objective:   Today's Vitals: BP 133/81 (BP Location: Left Arm, Patient Position: Sitting, Cuff Size: Normal)   Pulse 99   Temp (!) 97.5 F (36.4 C) (Temporal)   Ht 5\' 3"  (1.6 m)   Wt 179 lb 9.6 oz (81.5 kg)   SpO2 98%   BMI 31.81 kg/m   Physical Exam Vitals reviewed.  Constitutional:      Appearance: She is obese.  HENT:     Head: Normocephalic.     Right Ear: Tympanic membrane normal.     Left Ear: Tympanic membrane normal.  Eyes:     Extraocular Movements: Extraocular movements intact.     Pupils: Pupils are equal, round, and reactive to light.  Cardiovascular:     Rate and Rhythm: Normal rate and regular rhythm.     Pulses: Normal pulses.     Heart sounds: Normal heart sounds.  Pulmonary:     Effort: Pulmonary effort is normal.     Breath sounds: Normal breath sounds.  Abdominal:     General: Bowel sounds are normal.  Musculoskeletal:        General: Normal  range of motion.     Cervical back: Normal range of motion and neck supple.  Skin:    General: Skin is warm and dry.  Neurological:     Mental Status: She is alert and oriented to person, place, and time.  Psychiatric:        Mood and Affect: Mood normal.        Thought Content: Thought content normal.        Judgment: Judgment normal.     Assessment & Plan:  Diagnoses and all orders for this visit:  Encounter to establish care Juluis Mire, NP-C will be your  (PCP) she is mastered prepared . She is skilled to diagnosed and treat illness. Also able to answer health concern as well as continuing care of varied medical conditions, not limited by cause, organ system, or diagnosis.   Need for Tdap vaccination Tdap is recommended every 10 years for adults weekly or primary she gets tetanus..  At least 1 of those doses should be with Tdap in adults age 77 and older who have previously received Tdap.  Recommend by the CDC. -     Tdap vaccine greater than or equal to 7yo IM  History of prediabetes New diagnosis is Type 2 diabetes with new reading A1C  -     HgB A1c 6.8  Elevated blood pressure reading without diagnosis of hypertension 3 separate reading of 130/80. low-sodium, DASH diet,150 minutes of moderate intensity exercise per week.   Type 2 diabetes mellitus without complication, without long-term current use of insulin (HCC) ADA recommends the following therapeutic goals for glycemic control related to A1c measurements: Goal of therapy: Less than 6.5 hemoglobin A1c. Decrease foods that are high in carbohydrates are the following rice, potatoes, breads, sugars, and pastas.  Reduction in the intake (eating) will assist in lowering your blood sugars.  Tobacco abuse  She is aware of increased risk for lung cancer and other respiratory diseases recommend cessation.  This will be reminded at each clinical visit.      Outpatient Encounter Medications as of 11/10/2019  Medication  Sig  . benztropine (COGENTIN) 1 MG tablet Take 1 mg by mouth 2 (two) times daily.  . DULoxetine (CYMBALTA) 60 MG capsule Take 60 mg by mouth daily.  . hydrOXYzine (VISTARIL) 25 MG capsule Take 25 mg by mouth  3 (three) times daily as needed for anxiety.   . Oxcarbazepine (TRILEPTAL) 300 MG tablet Take 300 mg by mouth 2 (two) times daily.  . QUEtiapine (SEROQUEL) 400 MG tablet Take 2 tablets (800 mg total) by mouth at bedtime.  . [DISCONTINUED] cephALEXin (KEFLEX) 500 MG capsule Take 1 capsule (500 mg total) by mouth 4 (four) times daily.  . [DISCONTINUED] cloNIDine (CATAPRES) 0.1 MG tablet Take 0.1 mg by mouth 2 (two) times daily.  . [DISCONTINUED] ondansetron (ZOFRAN ODT) 4 MG disintegrating tablet Take 1 tablet (4 mg total) by mouth every 8 (eight) hours as needed.  . [DISCONTINUED] ondansetron (ZOFRAN) 4 MG tablet Take 1 tablet (4 mg total) by mouth every 6 (six) hours.  . [DISCONTINUED] topiramate (TOPAMAX) 100 MG tablet Take 150 mg by mouth daily.   No facility-administered encounter medications on file as of 11/10/2019.  Diagnoses and all orders for this visit:  Encounter to establish care Juluis Mire, NP-C will be your  (PCP) she is mastered prepared . She is skilled to diagnosed and treat illness. Also able to answer health concern as well as continuing care of varied medical conditions, not limited by cause, organ system, or diagnosis.    Need for Tdap vaccination Tdap is recommended every 10 years for adults weekly or primary she gets tetanus..  At least 1 of those doses should be with Tdap in adults age 47 and older who have previously received Tdap.  Recommend by the CDC. -     Tdap vaccine greater than or equal to 7yo IM  History of prediabetes -     HgB A1c  Elevated blood pressure reading without diagnosis of hypertension Counseled on blood pressure goal of less than 130/80, low-sodium, DASH diet, 150 minutes of moderate intensity exercise per week. Discussed medication  compliance, adverse effects.  Type 2 diabetes mellitus without complication, without long-term current use of insulin (HCC) ADA recommends the following therapeutic goals for glycemic control related to A1c measurements: Goal of therapy: Less than 6.5 hemoglobin A1c. Decrease foods that are high in carbohydrates are the following rice, potatoes, breads, sugars, and pastas.  Reduction in the intake (eating) will assist in lowering your blood sugars.  Tobacco abuse Nicotine affect every organ in the body second leading cause of death.  Increased risk for lung cancer and other respiratory diseases recommend cessation.  This will be reminded at each clinical visit.     Follow-up: Return for schedule for pap .   Kerin Perna, NP

## 2019-11-10 NOTE — Patient Instructions (Signed)
Diabetes Mellitus and Nutrition, Adult When you have diabetes (diabetes mellitus), it is very important to have healthy eating habits because your blood sugar (glucose) levels are greatly affected by what you eat and drink. Eating healthy foods in the appropriate amounts, at about the same times every day, can help you:  Control your blood glucose.  Lower your risk of heart disease.  Improve your blood pressure.  Reach or maintain a healthy weight. Every person with diabetes is different, and each person has different needs for a meal plan. Your health care provider may recommend that you work with a diet and nutrition specialist (dietitian) to make a meal plan that is best for you. Your meal plan may vary depending on factors such as:  The calories you need.  The medicines you take.  Your weight.  Your blood glucose, blood pressure, and cholesterol levels.  Your activity level.  Other health conditions you have, such as heart or kidney disease. How do carbohydrates affect me? Carbohydrates, also called carbs, affect your blood glucose level more than any other type of food. Eating carbs naturally raises the amount of glucose in your blood. Carb counting is a method for keeping track of how many carbs you eat. Counting carbs is important to keep your blood glucose at a healthy level, especially if you use insulin or take certain oral diabetes medicines. It is important to know how many carbs you can safely have in each meal. This is different for every person. Your dietitian can help you calculate how many carbs you should have at each meal and for each snack. Foods that contain carbs include:  Bread, cereal, rice, pasta, and crackers.  Potatoes and corn.  Peas, beans, and lentils.  Milk and yogurt.  Fruit and juice.  Desserts, such as cakes, cookies, ice cream, and candy. How does alcohol affect me? Alcohol can cause a sudden decrease in blood glucose (hypoglycemia),  especially if you use insulin or take certain oral diabetes medicines. Hypoglycemia can be a life-threatening condition. Symptoms of hypoglycemia (sleepiness, dizziness, and confusion) are similar to symptoms of having too much alcohol. If your health care provider says that alcohol is safe for you, follow these guidelines:  Limit alcohol intake to no more than 1 drink per day for nonpregnant women and 2 drinks per day for men. One drink equals 12 oz of beer, 5 oz of wine, or 1 oz of hard liquor.  Do not drink on an empty stomach.  Keep yourself hydrated with water, diet soda, or unsweetened iced tea.  Keep in mind that regular soda, juice, and other mixers may contain a lot of sugar and must be counted as carbs. What are tips for following this plan?  Reading food labels  Start by checking the serving size on the "Nutrition Facts" label of packaged foods and drinks. The amount of calories, carbs, fats, and other nutrients listed on the label is based on one serving of the item. Many items contain more than one serving per package.  Check the total grams (g) of carbs in one serving. You can calculate the number of servings of carbs in one serving by dividing the total carbs by 15. For example, if a food has 30 g of total carbs, it would be equal to 2 servings of carbs.  Check the number of grams (g) of saturated and trans fats in one serving. Choose foods that have low or no amount of these fats.  Check the number of   milligrams (mg) of salt (sodium) in one serving. Most people should limit total sodium intake to less than 2,300 mg per day.  Always check the nutrition information of foods labeled as "low-fat" or "nonfat". These foods may be higher in added sugar or refined carbs and should be avoided.  Talk to your dietitian to identify your daily goals for nutrients listed on the label. Shopping  Avoid buying canned, premade, or processed foods. These foods tend to be high in fat, sodium,  and added sugar.  Shop around the outside edge of the grocery store. This includes fresh fruits and vegetables, bulk grains, fresh meats, and fresh dairy. Cooking  Use low-heat cooking methods, such as baking, instead of high-heat cooking methods like deep frying.  Cook using healthy oils, such as olive, canola, or sunflower oil.  Avoid cooking with butter, cream, or high-fat meats. Meal planning  Eat meals and snacks regularly, preferably at the same times every day. Avoid going long periods of time without eating.  Eat foods high in fiber, such as fresh fruits, vegetables, beans, and whole grains. Talk to your dietitian about how many servings of carbs you can eat at each meal.  Eat 4-6 ounces (oz) of lean protein each day, such as lean meat, chicken, fish, eggs, or tofu. One oz of lean protein is equal to: ? 1 oz of meat, chicken, or fish. ? 1 egg. ?  cup of tofu.  Eat some foods each day that contain healthy fats, such as avocado, nuts, seeds, and fish. Lifestyle  Check your blood glucose regularly.  Exercise regularly as told by your health care provider. This may include: ? 150 minutes of moderate-intensity or vigorous-intensity exercise each week. This could be brisk walking, biking, or water aerobics. ? Stretching and doing strength exercises, such as yoga or weightlifting, at least 2 times a week.  Take medicines as told by your health care provider.  Do not use any products that contain nicotine or tobacco, such as cigarettes and e-cigarettes. If you need help quitting, ask your health care provider.  Work with a Social worker or diabetes educator to identify strategies to manage stress and any emotional and social challenges. Questions to ask a health care provider  Do I need to meet with a diabetes educator?  Do I need to meet with a dietitian?  What number can I call if I have questions?  When are the best times to check my blood glucose? Where to find more  information:  American Diabetes Association: diabetes.org  Academy of Nutrition and Dietetics: www.eatright.[employer] of Diabetes and Digestive and Kidney Diseases (NIH): [employer] Summary  A healthy meal plan will help you control your blood glucose and maintain a healthy lifestyle.  Working with a diet and nutrition specialist (dietitian) can help you make a meal plan that is best for you.  Keep in mind that carbohydrates (carbs) and alcohol have immediate effects on your blood glucose levels. It is important to count carbs and to use alcohol carefully. This information is not intended to replace advice given to you by your health care provider. Make sure you discuss any questions you have with your health care provider. Document Revised: 08/29/2017 Document Reviewed: 10/21/2016 Elsevier Patient Education  2020 [employer]. Managing Your Hypertension Hypertension is commonly called high blood pressure. This is when the force of your blood pressing against the walls of your arteries is too strong. Arteries are blood vessels that carry blood from your heart throughout  your body. Hypertension forces the heart to work harder to pump blood, and may cause the arteries to become narrow or stiff. Having untreated or uncontrolled hypertension can cause heart attack, stroke, kidney disease, and other problems. What are blood pressure readings? A blood pressure reading consists of a higher number over a lower number. Ideally, your blood pressure should be below 120/80. The first ("top") number is called the systolic pressure. It is a measure of the pressure in your arteries as your heart beats. The second ("bottom") number is called the diastolic pressure. It is a measure of the pressure in your arteries as the heart relaxes. What does my blood pressure reading mean? Blood pressure is classified into four stages. Based on your blood pressure reading, your health care provider may  use the following stages to determine what type of treatment you need, if any. Systolic pressure and diastolic pressure are measured in a unit called mm Hg. Normal  Systolic pressure: below 123456.  Diastolic pressure: below 80. Elevated  Systolic pressure: [id number].  Diastolic pressure: below 80. Hypertension stage 1  Systolic pressure: 0000000.  Diastolic pressure: [id number]. Hypertension stage 2  Systolic pressure: [id number] or above.  Diastolic pressure: 90 or above. What health risks are associated with hypertension? Managing your hypertension is an important responsibility. Uncontrolled hypertension can lead to:  A heart attack.  A stroke.  A weakened blood vessel (aneurysm).  Heart failure.  Kidney damage.  Eye damage.  Metabolic syndrome.  Memory and concentration problems. What changes can I make to manage my hypertension? Hypertension can be managed by making lifestyle changes and possibly by taking medicines. Your health care provider will help you make a plan to bring your blood pressure within a normal range. Eating and drinking   Eat a diet that is high in fiber and potassium, and low in salt (sodium), added sugar, and fat. An example eating plan is called the DASH (Dietary Approaches to Stop Hypertension) diet. To eat this way: ? Eat plenty of fresh fruits and vegetables. Try to fill half of your plate at each meal with fruits and vegetables. ? Eat whole grains, such as whole wheat pasta, brown rice, or whole grain bread. Fill about one quarter of your plate with whole grains. ? Eat low-fat diary products. ? Avoid fatty cuts of meat, processed or cured meats, and poultry with skin. Fill about one quarter of your plate with lean proteins such as fish, chicken without skin, beans, eggs, and tofu. ? Avoid premade and processed foods. These tend to be higher in sodium, added sugar, and fat.  Reduce your daily sodium intake. Most people with hypertension should eat less  than 1,500 mg of sodium a day.  Limit alcohol intake to no more than 1 drink a day for nonpregnant women and 2 drinks a day for men. One drink equals 12 oz of beer, 5 oz of wine, or 1 oz of hard liquor. Lifestyle  Work with your health care provider to maintain a healthy body weight, or to lose weight. Ask what an ideal weight is for you.  Get at least 30 minutes of exercise that causes your heart to beat faster (aerobic exercise) most days of the week. Activities may include walking, swimming, or biking.  Include exercise to strengthen your muscles (resistance exercise), such as weight lifting, as part of your weekly exercise routine. Try to do these types of exercises for 30 minutes at least 3 days a week.  Do not use any  products that contain nicotine or tobacco, such as cigarettes and e-cigarettes. If you need help quitting, ask your health care provider.  Control any long-term (chronic) conditions you have, such as high cholesterol or diabetes. Monitoring  Monitor your blood pressure at home as told by your health care provider. Your personal target blood pressure may vary depending on your medical conditions, your age, and other factors.  Have your blood pressure checked regularly, as often as told by your health care provider. Working with your health care provider  Review all the medicines you take with your health care provider because there may be side effects or interactions.  Talk with your health care provider about your diet, exercise habits, and other lifestyle factors that may be contributing to hypertension.  Visit your health care provider regularly. Your health care provider can help you create and adjust your plan for managing hypertension. Will I need medicine to control my blood pressure? Your health care provider may prescribe medicine if lifestyle changes are not enough to get your blood pressure under control, and if:  Your systolic blood pressure is 130 or  higher.  Your diastolic blood pressure is 80 or higher. Take medicines only as told by your health care provider. Follow the directions carefully. Blood pressure medicines must be taken as prescribed. The medicine does not work as well when you skip doses. Skipping doses also puts you at risk for problems. Contact a health care provider if:  You think you are having a reaction to medicines you have taken.  You have repeated (recurrent) headaches.  You feel dizzy.  You have swelling in your ankles.  You have trouble with your vision. Get help right away if:  You develop a severe headache or confusion.  You have unusual weakness or numbness, or you feel faint.  You have severe pain in your chest or abdomen.  You vomit repeatedly.  You have trouble breathing. Summary  Hypertension is when the force of blood pumping through your arteries is too strong. If this condition is not controlled, it may put you at risk for serious complications.  Your personal target blood pressure may vary depending on your medical conditions, your age, and other factors. For most people, a normal blood pressure is less than 120/80.  Hypertension is managed by lifestyle changes, medicines, or both. Lifestyle changes include weight loss, eating a healthy, low-sodium diet, exercising more, and limiting alcohol. This information is not intended to replace advice given to you by your health care provider. Make sure you discuss any questions you have with your health care provider. Document Revised: 01/08/2019 Document Reviewed: 08/14/2016 Elsevier Patient Education  [place].

## 2019-11-17 ENCOUNTER — Ambulatory Visit (INDEPENDENT_AMBULATORY_CARE_PROVIDER_SITE_OTHER): Payer: Self-pay | Admitting: Primary Care

## 2019-11-17 ENCOUNTER — Other Ambulatory Visit: Payer: Self-pay

## 2019-11-17 ENCOUNTER — Other Ambulatory Visit (HOSPITAL_COMMUNITY)
Admission: RE | Admit: 2019-11-17 | Discharge: 2019-11-17 | Disposition: A | Discharge status: Home / Self Care | Payer: Medicaid Other | Source: Ambulatory Visit | Attending: Primary Care | Admitting: Primary Care

## 2019-11-17 ENCOUNTER — Encounter (INDEPENDENT_AMBULATORY_CARE_PROVIDER_SITE_OTHER): Payer: Self-pay | Admitting: Primary Care

## 2019-11-17 VITALS — BP 132/92 | HR 71 | Temp 97.2°F | Ht 63.0 in | Wt 173.0 lb

## 2019-11-17 DIAGNOSIS — Z1159 Encounter for screening for other viral diseases: Secondary | ICD-10-CM

## 2019-11-17 DIAGNOSIS — Z113 Encounter for screening for infections with a predominantly sexual mode of transmission: Secondary | ICD-10-CM

## 2019-11-17 DIAGNOSIS — Z1231 Encounter for screening mammogram for malignant neoplasm of breast: Secondary | ICD-10-CM

## 2019-11-17 DIAGNOSIS — Z124 Encounter for screening for malignant neoplasm of cervix: Secondary | ICD-10-CM | POA: Insufficient documentation

## 2019-11-17 DIAGNOSIS — Z114 Encounter for screening for human immunodeficiency virus [HIV]: Secondary | ICD-10-CM

## 2019-11-17 DIAGNOSIS — Z720 Tobacco use: Secondary | ICD-10-CM

## 2019-11-17 DIAGNOSIS — E119 Type 2 diabetes mellitus without complications: Secondary | ICD-10-CM

## 2019-11-17 DIAGNOSIS — Z0000 Encounter for general adult medical examination without abnormal findings: Secondary | ICD-10-CM

## 2019-11-17 NOTE — Patient Instructions (Signed)
Health Maintenance, Female Adopting a healthy lifestyle and getting preventive care are important in promoting health and wellness. Ask your health care provider about:  The right schedule for you to have regular tests and exams.  Things you can do on your own to prevent diseases and keep yourself healthy. What should I know about diet, weight, and exercise? Eat a healthy diet   Eat a diet that includes plenty of vegetables, fruits, low-fat dairy products, and lean protein.  Do not eat a lot of foods that are high in solid fats, added sugars, or sodium. Maintain a healthy weight Body mass index (BMI) is used to identify weight problems. It estimates body fat based on height and weight. Your health care provider can help determine your BMI and help you achieve or maintain a healthy weight. Get regular exercise Get regular exercise. This is one of the most important things you can do for your health. Most adults should:  Exercise for at least 150 minutes each week. The exercise should increase your heart rate and make you sweat (moderate-intensity exercise).  Do strengthening exercises at least twice a week. This is in addition to the moderate-intensity exercise.  Spend less time sitting. Even light physical activity can be beneficial. Watch cholesterol and blood lipids Have your blood tested for lipids and cholesterol at 59 years of age, then have this test every 5 years. Have your cholesterol levels checked more often if:  Your lipid or cholesterol levels are high.  You are older than 59 years of age.  You are at high risk for heart disease. What should I know about cancer screening? Depending on your health history and family history, you may need to have cancer screening at various ages. This may include screening for:  Breast cancer.  Cervical cancer.  Colorectal cancer.  Skin cancer.  Lung cancer. What should I know about heart disease, diabetes, and high blood  pressure? Blood pressure and heart disease  High blood pressure causes heart disease and increases the risk of stroke. This is more likely to develop in people who have high blood pressure readings, are of African descent, or are overweight.  Have your blood pressure checked: ? Every 3-5 years if you are 18-39 years of age. ? Every year if you are 40 years old or older. Diabetes Have regular diabetes screenings. This checks your fasting blood sugar level. Have the screening done:  Once every three years after age 40 if you are at a normal weight and have a low risk for diabetes.  More often and at a younger age if you are overweight or have a high risk for diabetes. What should I know about preventing infection? Hepatitis B If you have a higher risk for hepatitis B, you should be screened for this virus. Talk with your health care provider to find out if you are at risk for hepatitis B infection. Hepatitis C Testing is recommended for:  Everyone born from 1945 through 1965.  Anyone with known risk factors for hepatitis C. Sexually transmitted infections (STIs)  Get screened for STIs, including gonorrhea and chlamydia, if: ? You are sexually active and are younger than 59 years of age. ? You are older than 59 years of age and your health care provider tells you that you are at risk for this type of infection. ? Your sexual activity has changed since you were last screened, and you are at increased risk for chlamydia or gonorrhea. Ask your health care provider if   you are at risk.  Ask your health care provider about whether you are at high risk for HIV. Your health care provider may recommend a prescription medicine to help prevent HIV infection. If you choose to take medicine to prevent HIV, you should first get tested for HIV. You should then be tested every 3 months for as long as you are taking the medicine. Pregnancy  If you are about to stop having your period (premenopausal) and  you may become pregnant, seek counseling before you get pregnant.  Take 400 to 800 micrograms (mcg) of folic acid every day if you become pregnant.  Ask for birth control (contraception) if you want to prevent pregnancy. Osteoporosis and menopause Osteoporosis is a disease in which the bones lose minerals and strength with aging. This can result in bone fractures. If you are 65 years old or older, or if you are at risk for osteoporosis and fractures, ask your health care provider if you should:  Be screened for bone loss.  Take a calcium or vitamin D supplement to lower your risk of fractures.  Be given hormone replacement therapy (HRT) to treat symptoms of menopause. Follow these instructions at home: Lifestyle  Do not use any products that contain nicotine or tobacco, such as cigarettes, e-cigarettes, and chewing tobacco. If you need help quitting, ask your health care provider.  Do not use street drugs.  Do not share needles.  Ask your health care provider for help if you need support or information about quitting drugs. Alcohol use  Do not drink alcohol if: ? Your health care provider tells you not to drink. ? You are pregnant, may be pregnant, or are planning to become pregnant.  If you drink alcohol: ? Limit how much you use to 0-1 drink a day. ? Limit intake if you are breastfeeding.  Be aware of how much alcohol is in your drink. In the [place] one drink equals one 12 oz bottle of beer (355 mL), one 5 oz glass of wine (148 mL), or one 1 oz glass of hard liquor (44 mL). General instructions  Schedule regular health, dental, and eye exams.  Stay current with your vaccines.  Tell your health care provider if: ? You often feel depressed. ? You have ever been abused or do not feel safe at home. Summary  Adopting a healthy lifestyle and getting preventive care are important in promoting health and wellness.  Follow your health care provider's instructions about healthy  diet, exercising, and getting tested or screened for diseases.  Follow your health care provider's instructions on monitoring your cholesterol and blood pressure. This information is not intended to replace advice given to you by your health care provider. Make sure you discuss any questions you have with your health care provider. Document Revised: 09/09/2018 Document Reviewed: 09/09/2018 Elsevier Patient Education  2020 [employer]  

## 2019-11-17 NOTE — Progress Notes (Signed)
Established Patient Office Visit  Subjective:  Patient ID: Angel Hughes, female    DOB: [birth year]  Age: 59 y.o. MRN: [id number]  CC:  Chief Complaint  Patient presents with  . Gynecologic Exam    HPI Angel Hughes presents for well woman exam.   Past Medical History:  Diagnosis Date  . Allergic rhinitis   . Anemia   . Anxiety   . Arthritis   . Bipolar disorder ([place])   . Depression   . GERD (gastroesophageal reflux disease)   . Hyperlipemia   . Hypertension    pt states that she has no hx of elevated BP, on med for hot flashes  . Obesity     Past Surgical History:  Procedure Laterality Date  . ABDOMINAL HYSTERECTOMY    . CESAREAN SECTION    . WRIST SURGERY     cyst  . Z-PLASTY REPAIR      Family History  Problem Relation Age of Onset  . Diabetes Paternal Grandmother   . Hypertension Paternal Grandmother   . Diabetes Maternal Grandmother   . Hypertension Maternal Grandmother   . Diabetes Mother   . Hypertension Mother   . Diabetes Brother   . Diabetes Sister   . Heart disease Sister   . Hypertension Sister     Social History   Socioeconomic History  . Marital status: Legally Separated    Spouse name: Not on file  . Number of children: Not on file  . Years of education: Not on file  . Highest education level: Not on file  Occupational History  . Not on file  Tobacco Use  . Smoking status: Current Every Day Smoker    Packs/day: 0.50    Years: 34.00    Pack years: 17.00  . Smokeless tobacco: Never Used  Substance and Sexual Activity  . Alcohol use: Yes    Alcohol/week: 0.0 standard drinks    Comment: occassionally  . Drug use: No  . Sexual activity: Not on file  Other Topics Concern  . Not on file  Social History Narrative  . Not on file   Social Determinants of Health   Financial Resource Strain:   . Difficulty of Paying Living Expenses: Not on file  Food Insecurity:   . Worried About Charity fundraiser in the Last Year: Not on file   . Ran Out of Food in the Last Year: Not on file  Transportation Needs:   . Lack of Transportation (Medical): Not on file  . Lack of Transportation (Non-Medical): Not on file  Physical Activity:   . Days of Exercise per Week: Not on file  . Minutes of Exercise per Session: Not on file  Stress:   . Feeling of Stress : Not on file  Social Connections:   . Frequency of Communication with Friends and Family: Not on file  . Frequency of Social Gatherings with Friends and Family: Not on file  . Attends Religious Services: Not on file  . Active Member of Clubs or Organizations: Not on file  . Attends Archivist Meetings: Not on file  . Marital Status: Not on file  Intimate Partner Violence:   . Fear of Current or Ex-Partner: Not on file  . Emotionally Abused: Not on file  . Physically Abused: Not on file  . Sexually Abused: Not on file    Outpatient Medications Prior to Visit  Medication Sig Dispense Refill  . benztropine (COGENTIN) 1 MG tablet Take 1 mg by  mouth 2 (two) times daily.    . DULoxetine (CYMBALTA) 60 MG capsule Take 60 mg by mouth daily.    . hydrOXYzine (VISTARIL) 25 MG capsule Take 25 mg by mouth 3 (three) times daily as needed for anxiety.     . Oxcarbazepine (TRILEPTAL) 300 MG tablet Take 300 mg by mouth 2 (two) times daily.    . QUEtiapine (SEROQUEL) 400 MG tablet Take 2 tablets (800 mg total) by mouth at bedtime. 60 tablet 3   No facility-administered medications prior to visit.    No Known Allergies  ROS Review of Systems  All other systems reviewed and are negative.     Objective:    Physical Exam CONSTITUTIONAL: Obese well-developed, well-nourished female in no acute distress.  HENT:  Normocephalic, atraumatic, External right and left ear normal. Oropharynx is clear and moist chanker sore on tongue  EYES: Conjunctivae and EOM are normal. Pupils are equal, round, and reactive to light. No scleral icterus.  NECK: Normal range of motion, supple,  no masses.  Normal thyroid.  SKIN: Skin is warm and dry. No rash noted. Not diaphoretic. No erythema. No pallor. [place]: Alert and oriented to person, place, and time. Normal reflexes, muscle tone coordination. No cranial nerve deficit noted. PSYCHIATRIC: Normal mood and affect. Normal behavior. Normal judgment and thought content. CARDIOVASCULAR: Normal heart rate noted, regular rhythm RESPIRATORY: Clear to auscultation bilaterally. Effort and breath sounds normal, no problems with respiration noted. BREASTS: Taught SBE and refer for mammogram  ABDOMEN: Soft, normal bowel sounds, no distention noted.  No tenderness, rebound or guarding.  PELVIC: Normal appearing external genitalia; normal appearing vaginal mucosa and cervix.  No abnormal discharge noted.  Pap smear obtained.  Normal uterine size, no other palpable masses, no uterine or adnexal tenderness. MUSCULOSKELETAL: Normal range of motion. No tenderness.  No cyanosis, clubbing, or edema.  2+ distal pulses. BP (!) 132/92 (BP Location: Left Arm, Patient Position: Sitting, Cuff Size: Normal)   Pulse 71   Temp (!) 97.2 F (36.2 C) (Temporal)   Ht 5\' 3"  (1.6 m)   Wt 173 lb (78.5 kg)   SpO2 93%   BMI 30.65 kg/m  Wt Readings from Last 3 Encounters:  11/17/19 173 lb (78.5 kg)  11/10/19 179 lb 9.6 oz (81.5 kg)  10/17/19 175 lb (79.4 kg)     Health Maintenance Due  Topic Date Due  . MAMMOGRAM  05/21/2017    There are no preventive care reminders to display for this patient.  Lab Results  Component Value Date   TSH 0.694 02/19/2010   Lab Results  Component Value Date   WBC 11.4 (H) 10/17/2019   HGB 15.1 (H) 10/17/2019   HCT 43.3 10/17/2019   MCV 75.0 (L) 10/17/2019   PLT 341 10/17/2019   Lab Results  Component Value Date   NA 138 10/17/2019   K 3.8 10/17/2019   CO2 22 10/17/2019   GLUCOSE 142 (H) 10/17/2019   BUN 16 10/17/2019   CREATININE 1.30 (H) 10/17/2019   BILITOT 1.0 10/17/2019   ALKPHOS 62 10/17/2019   AST  23 10/17/2019   ALT 23 10/17/2019   PROT 9.1 (H) 10/17/2019   ALBUMIN 5.2 (H) 10/17/2019   CALCIUM 10.2 10/17/2019   ANIONGAP 13 10/17/2019   Lab Results  Component Value Date   CHOL 261 (H) 06/01/2013   Lab Results  Component Value Date   HDL 47 06/01/2013   Lab Results  Component Value Date   LDLCALC 187 (H) 06/01/2013  Lab Results  Component Value Date   TRIG 136 06/01/2013   Lab Results  Component Value Date   CHOLHDL 5.6 06/01/2013   Lab Results  Component Value Date   HGBA1C 6.8 (A) 11/10/2019      Assessment & Plan:  Anoush was seen today for gynecologic exam.  Diagnoses and all orders for this visit:  Tobacco abuse She is aware of increased risk for lung cancer and other respiratory diseases recommend cessation.  This will be reminded at each clinical visit.  Cervical cancer screening The USPSTF recommendations screening of cervical cancer every 3 years with cervical cytology.  All women age 21 to 30 years are at risk for cervical cancer  -     Cytology - PAP([place])  Encounter for screening for HIV Health maintenance and quality metrics  -     HIV antibody (with reflex)  Need for hepatitis C screening test Health maintenance and quality metrics  -     Hepatitis C Antibody  Type 2 diabetes mellitus without complication, without long-term current use of insulin (HCC) New diagnosis previously prediabetes  Goal of therapy: Less than 6.5 hemoglobin A1c. Decrease foods that are high in carbohydrates are the following rice, potatoes, breads, sugars, and pastas.  Reduction in the intake (eating) will assist in lowering your blood sugars. -     Microalbumin, urine  Encounter for screening mammogram for malignant neoplasm of breast Mammogram are recommended for women age 38.    Encounter for screening examination for sexually transmitted disease -     Cervicovaginal ancillary only   Problem List Items Addressed This Visit    None    Visit  Diagnoses    Tobacco abuse    -  Primary   Cervical cancer screening       Relevant Orders   Cytology - PAP([place]) (Completed)   Encounter for screening for HIV       Relevant Orders   HIV antibody (with reflex) (Completed)   Need for hepatitis C screening test       Relevant Orders   Hepatitis C Antibody (Completed)   Type 2 diabetes mellitus without complication, without long-term current use of insulin ([place])       Relevant Orders   Microalbumin, urine (Completed)   Encounter for screening mammogram for malignant neoplasm of breast       Encounter for screening examination for sexually transmitted disease       Relevant Orders   Cervicovaginal ancillary only (Completed)      No orders of the defined types were placed in this encounter.   Follow-up: Return if symptoms worsen or fail to improve.    Kerin Perna, NP

## 2019-11-18 LAB — CERVICOVAGINAL ANCILLARY ONLY
Bacterial Vaginitis (gardnerella): POSITIVE — AB
Candida Glabrata: NEGATIVE
Candida Vaginitis: NEGATIVE
Chlamydia: NEGATIVE
Comment: NEGATIVE
Comment: NEGATIVE
Comment: NEGATIVE
Comment: NEGATIVE
Comment: NEGATIVE
Comment: NORMAL
Neisseria Gonorrhea: NEGATIVE
Trichomonas: NEGATIVE

## 2019-11-18 LAB — HEPATITIS C ANTIBODY: Hep C Virus Ab: <0.1 s/co ratio (ref 0.0–0.9)

## 2019-11-18 LAB — HIV ANTIBODY (ROUTINE TESTING W REFLEX): HIV Screen 4th Generation wRfx: NONREACTIVE

## 2019-11-19 ENCOUNTER — Telehealth (INDEPENDENT_AMBULATORY_CARE_PROVIDER_SITE_OTHER): Payer: Self-pay

## 2019-11-19 ENCOUNTER — Other Ambulatory Visit (INDEPENDENT_AMBULATORY_CARE_PROVIDER_SITE_OTHER): Payer: Self-pay | Admitting: Primary Care

## 2019-11-19 LAB — CYTOLOGY - PAP
Comment: NEGATIVE
Diagnosis: NEGATIVE
High risk HPV: NEGATIVE

## 2019-11-19 LAB — MICROALBUMIN, URINE: Microalbumin, Urine: 18.3 ug/mL

## 2019-11-19 MED ORDER — METRONIDAZOLE 500 MG PO TABS: 500.0000 mg | ORAL_TABLET | Freq: Two times a day (BID) | ORAL | 0 refills | Status: DC

## 2019-11-19 NOTE — Telephone Encounter (Signed)
-----   Message from Kerin Perna, NP sent at 11/19/2019 11:42 AM EST ----- Pap was normal except .A prescription for metronidazole. You take this medication twice a day for 7 days. Be sure that you do not drink alcohol when you take this medication because the combination can give you severe nausea and vomiting.  It can sometimes give people a metallic taste in their mouth while they are taking it. When you take antibiotics, it can wipe out your gut flora.  This can cause problems like diarrhea.  It would be helpful to restore your gut flora and potentially decrease the side effects if you were to take either an over-the-counter probiotic such as [employer], Radiographer, therapeutic or [employer]  I would recommend taking this on a daily basis for at least 3-4 weeks.

## 2019-11-19 NOTE — Telephone Encounter (Signed)
Patient verified date of birth. She is aware that labs were all normal except ancillary testing was positive for BV. Patient informed that antibiotic has been sent to pharmacy. She verbalized understanding of results. Nat Christen, CMA

## 2020-01-27 ENCOUNTER — Encounter (INDEPENDENT_AMBULATORY_CARE_PROVIDER_SITE_OTHER): Payer: Self-pay | Admitting: Primary Care

## 2020-01-27 ENCOUNTER — Other Ambulatory Visit: Payer: Self-pay

## 2020-01-27 ENCOUNTER — Telehealth (INDEPENDENT_AMBULATORY_CARE_PROVIDER_SITE_OTHER): Payer: Self-pay | Admitting: Primary Care

## 2020-01-27 DIAGNOSIS — N951 Menopausal and female climacteric states: Secondary | ICD-10-CM

## 2020-01-27 MED ORDER — GABAPENTIN 300 MG PO CAPS: 300.0000 mg | ORAL_CAPSULE | Freq: Every day | ORAL | 1 refills | Status: DC

## 2020-01-27 NOTE — Progress Notes (Signed)
Hot flashes getting worse  Hot flashes since she was 45

## 2020-01-29 NOTE — Progress Notes (Signed)
Virtual Visit via Telephone Note  I connected with [name] on 01/29/20 at  4:10 PM EDT by telephone and verified that I am speaking with the correct person using two identifiers.   I discussed the limitations, risks, security and privacy concerns of performing an evaluation and management service by telephone and the availability of in person appointments. I also discussed with the patient that there may be a patient responsible charge related to this service. The patient expressed understanding and agreed to proceed.   History of Present Illness: [name] is having a visit for menopausal symptoms hot flashes. Previously discussed natural management black cohosh, decrease and avoid spicy foods and caffeine, exercise   Past Medical History:  Diagnosis Date  . Allergic rhinitis   . Anemia   . Anxiety   . Arthritis   . Bipolar disorder ([place])   . Depression   . GERD (gastroesophageal reflux disease)   . Hyperlipemia   . Hypertension    pt states that she has no hx of elevated BP, on med for hot flashes  . Obesity    Current Outpatient Medications on File Prior to Visit  Medication Sig Dispense Refill  . benztropine (COGENTIN) 1 MG tablet Take 1 mg by mouth 2 (two) times daily.    . DULoxetine (CYMBALTA) 60 MG capsule Take 60 mg by mouth daily.    . hydrOXYzine (VISTARIL) 25 MG capsule Take 25 mg by mouth 3 (three) times daily as needed for anxiety.     . Oxcarbazepine (TRILEPTAL) 300 MG tablet Take 300 mg by mouth 2 (two) times daily.    . QUEtiapine (SEROQUEL) 400 MG tablet Take 2 tablets (800 mg total) by mouth at bedtime. 60 tablet 3   No current facility-administered medications on file prior to visit.   Observations/Objective: Review of systems negative except for hot flashes creating discomfort sweating unable to hold body off.  Assessment and Plan: Menopause is a natural decline in reproductive hormones in women that reach their 93s to 48s.  This is the absence  of a menstrual cycle for 12 months.  Symptoms can include fatigue, night sweats, hot flashes, or sweating.  Also, common features anxiety, dry skin, irritability, moodiness,vaginal dryness. She does not have a decline in sexual drive. Reviewed Up to date and will prescribe gabapentin 300mg  at bedtime  Follow Up Instructions:    I discussed the assessment and treatment plan with the patient. The patient was provided an opportunity to ask questions and all were answered. The patient agreed with the plan and demonstrated an understanding of the instructions.   The patient was advised to call back or seek an in-person evaluation if the symptoms worsen or if the condition fails to improve as anticipated.  I provided 12 minutes of non-face-to-face time during this encounter.   Kerin Perna, NP

## 2020-02-14 ENCOUNTER — Other Ambulatory Visit: Payer: Self-pay

## 2020-02-14 ENCOUNTER — Encounter (INDEPENDENT_AMBULATORY_CARE_PROVIDER_SITE_OTHER): Payer: Self-pay

## 2020-02-14 ENCOUNTER — Ambulatory Visit (INDEPENDENT_AMBULATORY_CARE_PROVIDER_SITE_OTHER): Payer: Self-pay

## 2020-02-14 VITALS — Temp 97.3°F | Ht 63.0 in | Wt 178.0 lb

## 2020-02-14 DIAGNOSIS — E119 Type 2 diabetes mellitus without complications: Secondary | ICD-10-CM

## 2020-02-14 LAB — POCT GLYCOSYLATED HEMOGLOBIN (HGB A1C): Hemoglobin A1C: 6.3 % — AB (ref 4.0–5.6)

## 2020-05-08 ENCOUNTER — Ambulatory Visit (HOSPITAL_COMMUNITY): Payer: No Payment, Other | Admitting: Licensed Clinical Social Worker

## 2020-05-08 ENCOUNTER — Other Ambulatory Visit: Payer: Self-pay

## 2020-05-08 DIAGNOSIS — Z711 Person with feared health complaint in whom no diagnosis is made: Secondary | ICD-10-CM

## 2020-05-09 NOTE — Progress Notes (Signed)
Pt presents for in person initial assessment. LCSW introduces self and role. Pt states she does not want counseling. She states she is only present to get her medications. LCSW facilitated pt knowing when med management appt is and wrote this down for her. Assessed for adequate supply of meds until appt. Pt is unsure and will check when she returns home. LCSW reviewed options should pt believe she will run out of meds before appt. Pt verbalizes understanding and states appreciation.

## 2020-05-12 ENCOUNTER — Other Ambulatory Visit: Payer: Self-pay

## 2020-05-12 ENCOUNTER — Emergency Department (HOSPITAL_COMMUNITY)
Admission: EM | Admit: 2020-05-12 | Discharge: 2020-05-13 | Disposition: A | Discharge status: Home / Self Care | Payer: Self-pay | Attending: Emergency Medicine | Admitting: Emergency Medicine

## 2020-05-12 ENCOUNTER — Emergency Department (HOSPITAL_COMMUNITY): Payer: Self-pay

## 2020-05-12 ENCOUNTER — Encounter (HOSPITAL_COMMUNITY): Payer: Self-pay

## 2020-05-12 DIAGNOSIS — Z20822 Contact with and (suspected) exposure to covid-19: Secondary | ICD-10-CM | POA: Insufficient documentation

## 2020-05-12 DIAGNOSIS — Z79899 Other long term (current) drug therapy: Secondary | ICD-10-CM | POA: Insufficient documentation

## 2020-05-12 DIAGNOSIS — F17210 Nicotine dependence, cigarettes, uncomplicated: Secondary | ICD-10-CM | POA: Insufficient documentation

## 2020-05-12 DIAGNOSIS — F19920 Other psychoactive substance use, unspecified with intoxication, uncomplicated: Secondary | ICD-10-CM

## 2020-05-12 LAB — RAPID URINE DRUG SCREEN, HOSP PERFORMED
Amphetamines: NOT DETECTED
Barbiturates: NOT DETECTED
Benzodiazepines: NOT DETECTED
Cocaine: NOT DETECTED
Opiates: NOT DETECTED
Tetrahydrocannabinol: POSITIVE — AB

## 2020-05-12 LAB — CBC WITH DIFFERENTIAL/PLATELET
Abs Immature Granulocytes: 0.02 10*3/uL (ref 0.00–0.07)
Basophils Absolute: 0.1 10*3/uL (ref 0.0–0.1)
Basophils Relative: 1 %
Eosinophils Absolute: 0.2 10*3/uL (ref 0.0–0.5)
Eosinophils Relative: 4 %
HCT: 34.6 % — ABNORMAL LOW (ref 36.0–46.0)
Hemoglobin: 12.3 g/dL (ref 12.0–15.0)
Immature Granulocytes: 0 %
Lymphocytes Relative: 37 %
Lymphs Abs: 2.1 10*3/uL (ref 0.7–4.0)
MCH: 27.8 pg (ref 26.0–34.0)
MCHC: 35.5 g/dL (ref 30.0–36.0)
MCV: 78.1 fL — ABNORMAL LOW (ref 80.0–100.0)
Monocytes Absolute: 0.7 10*3/uL (ref 0.1–1.0)
Monocytes Relative: 12 %
Neutro Abs: 2.7 10*3/uL (ref 1.7–7.7)
Neutrophils Relative %: 46 %
Platelets: 236 10*3/uL (ref 150–400)
RBC: 4.43 MIL/uL (ref 3.87–5.11)
RDW: 14.2 % (ref 11.5–15.5)
WBC: 5.8 10*3/uL (ref 4.0–10.5)
nRBC: 0 % (ref 0.0–0.2)

## 2020-05-12 LAB — COMPREHENSIVE METABOLIC PANEL
ALT: 21 U/L (ref 0–44)
AST: 22 U/L (ref 15–41)
Albumin: 4.1 g/dL (ref 3.5–5.0)
Alkaline Phosphatase: 52 U/L (ref 38–126)
Anion gap: 11 (ref 5–15)
BUN: 16 mg/dL (ref 6–20)
CO2: 21 mmol/L — ABNORMAL LOW (ref 22–32)
Calcium: 9.1 mg/dL (ref 8.9–10.3)
Chloride: 104 mmol/L (ref 98–111)
Creatinine, Ser: 1.16 mg/dL — ABNORMAL HIGH (ref 0.44–1.00)
GFR calc Af Amer: 60 mL/min — ABNORMAL LOW (ref >60)
GFR calc non Af Amer: 51 mL/min — ABNORMAL LOW (ref >60)
Glucose, Bld: 131 mg/dL — ABNORMAL HIGH (ref 70–99)
Potassium: 3.6 mmol/L (ref 3.5–5.1)
Sodium: 136 mmol/L (ref 135–145)
Total Bilirubin: 0.4 mg/dL (ref 0.3–1.2)
Total Protein: 7.3 g/dL (ref 6.5–8.1)

## 2020-05-12 LAB — URINALYSIS, ROUTINE W REFLEX MICROSCOPIC
Bilirubin Urine: NEGATIVE
Glucose, UA: NEGATIVE mg/dL
Hgb urine dipstick: NEGATIVE
Ketones, ur: NEGATIVE mg/dL
Leukocytes,Ua: NEGATIVE
Nitrite: NEGATIVE
Protein, ur: NEGATIVE mg/dL
Specific Gravity, Urine: 1.002 — ABNORMAL LOW (ref 1.005–1.030)
pH: 6 (ref 5.0–8.0)

## 2020-05-12 LAB — ETHANOL: Alcohol, Ethyl (B): 72 mg/dL — ABNORMAL HIGH (ref <10)

## 2020-05-12 LAB — ACETAMINOPHEN LEVEL: Acetaminophen (Tylenol), Serum: <10 ug/mL — ABNORMAL LOW (ref 10–30)

## 2020-05-12 LAB — SARS CORONAVIRUS 2 BY RT PCR (HOSPITAL ORDER, PERFORMED IN ~~LOC~~ HOSPITAL LAB): SARS Coronavirus 2: NEGATIVE

## 2020-05-12 LAB — SALICYLATE LEVEL: Salicylate Lvl: <7 mg/dL — ABNORMAL LOW (ref 7.0–30.0)

## 2020-05-12 LAB — CBG MONITORING, ED: Glucose-Capillary: 146 mg/dL — ABNORMAL HIGH (ref 70–99)

## 2020-05-12 NOTE — ED Provider Notes (Signed)
[facility]PART[facility] Provider Note  CSN: [id number] Arrival date & time: 05/12/20 2115    History Chief Complaint  Patient presents with  . Drug Overdose    HPI  Angel Hughes is a 59 y.o. female brought to the ED via EMS who report the patient was at home today when family found her unresponsive. They threw some water on her to try to wake her up. She admits to drinking alcohol and taking one blue pill "for her head" but she is unable to further elaborate and falls asleep frequently during evaluation. EMS reports she had mild hypoxia and hypotension, given IVF and supplemental oxygen enroute with good improvement.    Past Medical History:  Diagnosis Date  . Allergic rhinitis   . Anemia   . Anxiety   . Arthritis   . Bipolar disorder ([place])   . Depression   . GERD (gastroesophageal reflux disease)   . Hyperlipemia   . Hypertension    pt states that she has no hx of elevated BP, on med for hot flashes  . Obesity     Past Surgical History:  Procedure Laterality Date  . ABDOMINAL HYSTERECTOMY    . CESAREAN SECTION    . WRIST SURGERY     cyst  . Z-PLASTY REPAIR      Family History  Problem Relation Age of Onset  . Diabetes Paternal Grandmother   . Hypertension Paternal Grandmother   . Diabetes Maternal Grandmother   . Hypertension Maternal Grandmother   . Diabetes Mother   . Hypertension Mother   . Diabetes Brother   . Diabetes Sister   . Heart disease Sister   . Hypertension Sister     Social History   Tobacco Use  . Smoking status: Current Every Day Smoker    Packs/day: 0.50    Years: 34.00    Pack years: 17.00  . Smokeless tobacco: Never Used  Substance Use Topics  . Alcohol use: Yes    Alcohol/week: 0.0 standard drinks    Comment: occassionally  . Drug use: No     Home Medications Prior to Admission medications   Medication Sig Start Date End Date Taking? Authorizing Provider  benztropine (COGENTIN) 1 MG tablet Take 1 mg by mouth  2 (two) times daily.    [provider]  DULoxetine (CYMBALTA) 60 MG capsule Take 60 mg by mouth daily.    [provider]  gabapentin (NEURONTIN) 300 MG capsule Take 1 capsule (300 mg total) by mouth at bedtime. 01/27/20   Kerin Perna, NP  hydrOXYzine (VISTARIL) 25 MG capsule Take 25 mg by mouth 3 (three) times daily as needed for anxiety.     [provider]  Oxcarbazepine (TRILEPTAL) 300 MG tablet Take 300 mg by mouth 2 (two) times daily.    [provider]  QUEtiapine (SEROQUEL) 400 MG tablet Take 2 tablets (800 mg total) by mouth at bedtime. 09/17/13   Theodis Blaze, MD     Allergies    Patient has no known allergies.   Review of Systems   Review of Systems Unable to assess due to mental status.    Physical Exam BP 132/85   Pulse 87   Temp 98.3 F (36.8 C) (Oral)   Resp 13   Ht 5\' 3"  (1.6 m)   Wt 82.1 kg   SpO2 99%   BMI 32.06 kg/m   Physical Exam Vitals and nursing note reviewed.  Constitutional:  Appearance: Normal appearance. She is obese.  HENT:     Head: Normocephalic and atraumatic.     Nose: Nose normal.     Mouth/Throat:     Mouth: Mucous membranes are moist.  Eyes:     Extraocular Movements: Extraocular movements intact.     Conjunctiva/sclera: Conjunctivae normal.     Pupils: Pupils are equal, round, and reactive to light.  Cardiovascular:     Rate and Rhythm: Normal rate.  Pulmonary:     Effort: Pulmonary effort is normal.     Breath sounds: Normal breath sounds.  Abdominal:     General: Abdomen is flat.     Palpations: Abdomen is soft.     Tenderness: There is no abdominal tenderness.  Musculoskeletal:        General: No swelling. Normal range of motion.     Cervical back: Neck supple.  Skin:    General: Skin is warm and dry.  Neurological:     General: No focal deficit present.     Mental Status: She is disoriented.     Motor: No weakness.     Comments: Somnolent, wakes up to verbal stimuli,  follows commands but falls asleep frequently.   Psychiatric:        Mood and Affect: Mood normal.      ED Results / Procedures / Treatments   Labs (all labs ordered are listed, but only abnormal results are displayed) Labs Reviewed  COMPREHENSIVE METABOLIC PANEL - Abnormal; Notable for the following components:      Result Value   CO2 21 (*)    Glucose, Bld 131 (*)    Creatinine, Ser 1.16 (*)    GFR calc non Af Amer 51 (*)    GFR calc Af Amer 60 (*)    All other components within normal limits  ETHANOL - Abnormal; Notable for the following components:   Alcohol, Ethyl (B) 72 (*)    All other components within normal limits  CBC WITH DIFFERENTIAL/PLATELET - Abnormal; Notable for the following components:   HCT 34.6 (*)    MCV 78.1 (*)    All other components within normal limits  ACETAMINOPHEN LEVEL - Abnormal; Notable for the following components:   Acetaminophen (Tylenol), Serum <10 (*)    All other components within normal limits  SALICYLATE LEVEL - Abnormal; Notable for the following components:   Salicylate Lvl <9.6 (*)    All other components within normal limits  CBG MONITORING, ED - Abnormal; Notable for the following components:   Glucose-Capillary 146 (*)    All other components within normal limits  SARS CORONAVIRUS 2 BY RT PCR (HOSPITAL ORDER, [place] [facility])  URINALYSIS, ROUTINE W REFLEX MICROSCOPIC  RAPID URINE DRUG SCREEN, HOSP PERFORMED    EKG EKG Interpretation  Date/Time:  Friday May 12 2020 22:03:16 EDT Ventricular Rate:  83 PR Interval:    QRS Duration: 79 QT Interval:  466 QTC Calculation: 548 R Axis:   10 Text Interpretation: Sinus rhythm Right atrial enlargement Abnormal R-wave progression, early transition Borderline T abnormalities, anterior leads Prolonged QT interval Baseline wander in lead(s) V6 No significant change since last tracing Confirmed by Calvert Cantor [phone]) on 05/12/2020 10:11:47  PM   Radiology CT Head Wo Contrast  Result Date: 05/12/2020 CLINICAL DATA:  Mental status change EXAM: CT HEAD WITHOUT CONTRAST TECHNIQUE: Contiguous axial images were obtained from the base of the skull through the vertex without intravenous contrast. COMPARISON:  CT brain 02/12/2008 FINDINGS:  Brain: No acute territorial infarction, hemorrhage or intracranial mass. The ventricles are nonenlarged. Vascular: No hyperdense vessels.  No unexpected calcification Skull: Normal. Negative for fracture or focal lesion. Sinuses/Orbits: No acute finding. Other: None IMPRESSION: Negative non contrasted CT appearance of the brain. Electronically Signed   By: Donavan Foil M.D.   On: 05/12/2020 21:59    Procedures Procedures  Medications Ordered in the ED Medications - No data to display   MDM Rules/Calculators/A&P MDM Patient with confusion, somnolence in setting of reported alcohol use and taking one unknown blue pills. She denies self-harm. Will check labs, CT head, EKG and cardiac monitor. SpO2 90-93% during my evaluation, placed on 2L [place] oxygen, otherwise maintaining airway.  ED Course  I have reviewed the triage vital signs and the nursing notes.  Pertinent labs & imaging results that were available during my care of the patient were reviewed by me and considered in my medical decision making (see chart for details).  Clinical Course as of May 13 2315  Fri May 12, 2020  2135 FSBS is normal   [CS]  2210 CT head images and results reviewed. No concerning findings.    [CS]  2220 CBC is normal    [CS]  7903 CMP unremarkable.    [CS]  2249 APAP and ASA levels normal. EtOH only mildly elevated, not likely sufficient to explain the patient's level of obtundation. I expect that the 'blue pill' may have been Xanax, although she cannot confirm. She is still sleepy but arousable and maintaining her airway. RN asked to collect urine by in-and-out cath. Per PDMP, no prior Rx for benzos, only one  previous Rx for opiates    [CS]  [place] of the patient signed out to Dr. Laverta Baltimore at change of shift pending UDS and sobering.    [CS]    Clinical Course User Index [CS] Truddie Hidden, MD    Final Clinical Impression(s) / ED Diagnoses Final diagnoses:  None    Rx / DC Orders ED Discharge Orders    None       Truddie Hidden, MD 05/12/20 2316

## 2020-05-12 NOTE — ED Triage Notes (Addendum)
Pt called out for unresponsive / pt unable to stay awake. Pt denies any opiates and says she took a "blue pill". Pupils equil and reactive to light. [employer] placet pt on NRB due to o2 sats in mid 80s. Pt falling asleep during triage questions.

## 2020-05-12 NOTE — ED Provider Notes (Signed)
Blood pressure 132/85, pulse 87, temperature 98.3 F (36.8 C), temperature source Oral, resp. rate 13, height 5\' 3"  (1.6 m), weight 82.1 kg, SpO2 99 %.  Assuming care from Dr. Karle Starch.  In short, Angel Hughes is a 59 y.o. female with a chief complaint of Drug Overdose .  Refer to the original H&P for additional details.  The current plan of care is to f/u on patient after sobering.  12:15 AM  Patient is awake and alert. She reports feeling well. Will PO challenge and ambulate. UDS with THC but negative for other broad categories. Labs and other imaging reviewed.   Updated the patient's son by phone who can come and pick up the patient when ready.   01:24 AM  Patient is up and walking. Husband at bedside to drive the patient home. Will discharge.    Margette Fast, MD 05/13/20 [phone]

## 2020-05-13 NOTE — Discharge Instructions (Signed)
Please follow with your primary care doctor in the coming week. Return to the ED with any new or worsening symptoms.

## 2020-05-13 NOTE — ED Notes (Signed)
PT provided sandwich and apple juice.

## 2020-05-13 NOTE — ED Notes (Addendum)
Pt stable and able to ambulate without assistance. Pt able to tolerate PO fluids and food. MD made aware.

## 2020-06-07 ENCOUNTER — Other Ambulatory Visit: Payer: Self-pay

## 2020-06-07 ENCOUNTER — Ambulatory Visit (INDEPENDENT_AMBULATORY_CARE_PROVIDER_SITE_OTHER): Payer: No Payment, Other | Admitting: Psychiatry

## 2020-06-07 ENCOUNTER — Encounter (HOSPITAL_COMMUNITY): Payer: Self-pay | Admitting: Psychiatry

## 2020-06-07 DIAGNOSIS — F411 Generalized anxiety disorder: Secondary | ICD-10-CM | POA: Diagnosis not present

## 2020-06-07 DIAGNOSIS — F319 Bipolar disorder, unspecified: Secondary | ICD-10-CM

## 2020-06-07 MED ORDER — DULOXETINE HCL 60 MG PO CPEP: 60.0000 mg | ORAL_CAPSULE | Freq: Two times a day (BID) | ORAL | 2 refills | Status: DC

## 2020-06-07 MED ORDER — BENZTROPINE MESYLATE 1 MG PO TABS: 1.0000 mg | ORAL_TABLET | Freq: Two times a day (BID) | ORAL | 2 refills | Status: DC

## 2020-06-07 MED ORDER — OXCARBAZEPINE 300 MG PO TABS: 300.0000 mg | ORAL_TABLET | Freq: Two times a day (BID) | ORAL | 2 refills | Status: DC

## 2020-06-07 MED ORDER — HYDROXYZINE PAMOATE 50 MG PO CAPS: 50.0000 mg | ORAL_CAPSULE | Freq: Three times a day (TID) | ORAL | 2 refills | Status: DC

## 2020-06-07 MED ORDER — QUETIAPINE FUMARATE 400 MG PO TABS: 800.0000 mg | ORAL_TABLET | Freq: Every day | ORAL | 2 refills | Status: DC

## 2020-06-07 MED ORDER — HYDROXYZINE PAMOATE 50 MG PO CAPS: 50.0000 mg | ORAL_CAPSULE | ORAL | 2 refills | Status: DC | PRN
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
## 2020-07-05 ENCOUNTER — Telehealth (INDEPENDENT_AMBULATORY_CARE_PROVIDER_SITE_OTHER): Payer: Self-pay | Admitting: *Deleted

## 2020-07-10 NOTE — Telephone Encounter (Signed)
Patient's family member phoned to make appointment for the patient due to recent seizure activity.

## 2020-07-13 ENCOUNTER — Other Ambulatory Visit: Payer: Self-pay

## 2020-07-13 ENCOUNTER — Encounter (INDEPENDENT_AMBULATORY_CARE_PROVIDER_SITE_OTHER): Payer: Self-pay | Admitting: Primary Care

## 2020-07-13 ENCOUNTER — Ambulatory Visit (INDEPENDENT_AMBULATORY_CARE_PROVIDER_SITE_OTHER): Payer: Self-pay | Admitting: Primary Care

## 2020-07-13 VITALS — BP 119/80 | HR 93 | Temp 97.3°F | Ht 63.0 in | Wt 179.6 lb

## 2020-07-13 DIAGNOSIS — R55 Syncope and collapse: Secondary | ICD-10-CM

## 2020-07-13 DIAGNOSIS — Z23 Encounter for immunization: Secondary | ICD-10-CM

## 2020-07-13 DIAGNOSIS — N951 Menopausal and female climacteric states: Secondary | ICD-10-CM

## 2020-07-13 DIAGNOSIS — Z1231 Encounter for screening mammogram for malignant neoplasm of breast: Secondary | ICD-10-CM

## 2020-07-13 NOTE — Progress Notes (Signed)
Established Patient Office Visit  Subjective:  Patient ID: Angel Hughes, female    DOB: October 10, 1960  Age: 59 y.o. MRN: [id number]  CC:  Chief Complaint  Patient presents with  . seizure like activity    HPI Ms.Angel Hughes is a 59  Year old female who presents for seizure like activity noted by her next-door neighbor.  Patient does not have any history of seizures. NO warning and she collapsed and hit her head refused to go to ED. This has taken place 3 times in the last month. The last 2 episodes she did not have any alcohol use.   Past Medical History:  Diagnosis Date  . Allergic rhinitis   . Anemia   . Anxiety   . Arthritis   . Bipolar disorder ([place])   . Depression   . GERD (gastroesophageal reflux disease)   . Hyperlipemia   . Hypertension    pt states that she has no hx of elevated BP, on med for hot flashes  . Obesity     Past Surgical History:  Procedure Laterality Date  . ABDOMINAL HYSTERECTOMY    . CESAREAN SECTION    . WRIST SURGERY     cyst  . Z-PLASTY REPAIR      Family History  Problem Relation Age of Onset  . Diabetes Paternal Grandmother   . Hypertension Paternal Grandmother   . Diabetes Maternal Grandmother   . Hypertension Maternal Grandmother   . Diabetes Mother   . Hypertension Mother   . Diabetes Brother   . Diabetes Sister   . Heart disease Sister   . Hypertension Sister     Social History   Socioeconomic History  . Marital status: Legally Separated    Spouse name: Not on file  . Number of children: Not on file  . Years of education: Not on file  . Highest education level: Not on file  Occupational History  . Not on file  Tobacco Use  . Smoking status: Current Every Day Smoker    Packs/day: 0.50    Years: 34.00    Pack years: 17.00  . Smokeless tobacco: Never Used  Substance and Sexual Activity  . Alcohol use: Yes    Alcohol/week: 0.0 standard drinks    Comment: occassionally  . Drug use: No  . Sexual activity: Not on  file  Other Topics Concern  . Not on file  Social History Narrative  . Not on file   Social Determinants of Health   Financial Resource Strain:   . Difficulty of Paying Living Expenses: Not on file  Food Insecurity:   . Worried About Charity fundraiser in the Last Year: Not on file  . Ran Out of Food in the Last Year: Not on file  Transportation Needs:   . Lack of Transportation (Medical): Not on file  . Lack of Transportation (Non-Medical): Not on file  Physical Activity:   . Days of Exercise per Week: Not on file  . Minutes of Exercise per Session: Not on file  Stress:   . Feeling of Stress : Not on file  Social Connections:   . Frequency of Communication with Friends and Family: Not on file  . Frequency of Social Gatherings with Friends and Family: Not on file  . Attends Religious Services: Not on file  . Active Member of Clubs or Organizations: Not on file  . Attends Archivist Meetings: Not on file  . Marital Status: Not on file  Intimate  Partner Violence:   . Fear of Current or Ex-Partner: Not on file  . Emotionally Abused: Not on file  . Physically Abused: Not on file  . Sexually Abused: Not on file    Outpatient Medications Prior to Visit  Medication Sig Dispense Refill  . benztropine (COGENTIN) 1 MG tablet Take 1 tablet (1 mg total) by mouth 2 (two) times daily. 60 tablet 2  . DULoxetine (CYMBALTA) 60 MG capsule Take 1 capsule (60 mg total) by mouth 2 (two) times daily. 60 capsule 2  . hydrOXYzine (VISTARIL) 50 MG capsule Take 1 capsule (50 mg total) by mouth every 4 (four) hours as needed. 120 capsule 2  . Oxcarbazepine (TRILEPTAL) 300 MG tablet Take 1 tablet (300 mg total) by mouth 2 (two) times daily. 60 tablet 2  . QUEtiapine (SEROQUEL) 400 MG tablet Take 2 tablets (800 mg total) by mouth at bedtime. 60 tablet 2   No facility-administered medications prior to visit.    No Known Allergies  ROS Review of Systems  Endocrine:       Hot flashes    Neurological: Positive for syncope.  Psychiatric/Behavioral: Positive for agitation. The patient is nervous/anxious.        Depression   All other systems reviewed and are negative.     Objective:    Physical Exam Vitals reviewed.  Constitutional:      Appearance: She is obese.  HENT:     Right Ear: Tympanic membrane normal.     Left Ear: Tympanic membrane normal.     Nose: Nose normal.  Eyes:     Extraocular Movements: Extraocular movements intact.  Cardiovascular:     Rate and Rhythm: Normal rate and regular rhythm.  Pulmonary:     Effort: Pulmonary effort is normal.     Breath sounds: Normal breath sounds.  Abdominal:     General: Bowel sounds are normal. There is distension.     Palpations: Abdomen is soft.  Musculoskeletal:        General: Normal range of motion.     Cervical back: Normal range of motion and neck supple.  Skin:    General: Skin is warm and dry.  Neurological:     Mental Status: She is alert and oriented to person, place, and time.  Psychiatric:        Mood and Affect: Mood normal.        Behavior: Behavior normal.        Thought Content: Thought content normal.        Judgment: Judgment normal.     BP 119/80 (BP Location: Left Arm, Patient Position: Sitting, Cuff Size: Normal)   Pulse 93   Temp (!) 97.3 F (36.3 C) (Temporal)   Ht 5\' 3"  (1.6 m)   Wt 179 lb 9.6 oz (81.5 kg)   SpO2 96%   BMI 31.81 kg/m  Wt Readings from Last 3 Encounters:  07/13/20 179 lb 9.6 oz (81.5 kg)  05/12/20 181 lb (82.1 kg)  02/14/20 178 lb (80.7 kg)     Health Maintenance Due  Topic Date Due  . MAMMOGRAM  05/21/2017    There are no preventive care reminders to display for this patient.  Lab Results  Component Value Date   TSH 0.694 02/19/2010   Lab Results  Component Value Date   WBC 5.8 05/12/2020   HGB 12.3 05/12/2020   HCT 34.6 (L) 05/12/2020   MCV 78.1 (L) 05/12/2020   PLT 236 05/12/2020   Lab Results  Component Value Date   NA 136  05/12/2020   K 3.6 05/12/2020   CO2 21 (L) 05/12/2020   GLUCOSE 131 (H) 05/12/2020   BUN 16 05/12/2020   CREATININE 1.16 (H) 05/12/2020   BILITOT 0.4 05/12/2020   ALKPHOS 52 05/12/2020   AST 22 05/12/2020   ALT 21 05/12/2020   PROT 7.3 05/12/2020   ALBUMIN 4.1 05/12/2020   CALCIUM 9.1 05/12/2020   ANIONGAP 11 05/12/2020   Lab Results  Component Value Date   CHOL 261 (H) 06/01/2013   Lab Results  Component Value Date   HDL 47 06/01/2013   Lab Results  Component Value Date   LDLCALC 187 (H) 06/01/2013   Lab Results  Component Value Date   TRIG 136 06/01/2013   Lab Results  Component Value Date   CHOLHDL 5.6 06/01/2013   Lab Results  Component Value Date   HGBA1C 6.3 (A) 02/14/2020      Assessment & Plan:  Shakyra was seen today for seizure like activity.  Diagnoses and all orders for this visit:  Syncope and collapse -     Ambulatory referral to Neurology  Menopausal hot flushes Advised to try black cohosh  Encounter for screening mammogram for malignant neoplasm of breast -     MM Digital Diagnostic Bilat; Future  Need for immunization against influenza CDC recommends influenza vaccine yearly.   -     Flu Vaccine QUAD 36+ mos IM   No orders of the defined types were placed in this encounter.   Follow-up: No follow-ups on file.    Kerin Perna, NP

## 2020-07-13 NOTE — Patient Instructions (Signed)

## 2020-07-20 ENCOUNTER — Other Ambulatory Visit (HOSPITAL_COMMUNITY): Payer: Self-pay | Admitting: Psychiatry

## 2020-07-21 ENCOUNTER — Encounter: Payer: Self-pay | Admitting: Neurology

## 2020-09-06 ENCOUNTER — Encounter (HOSPITAL_COMMUNITY): Payer: No Payment, Other | Admitting: Psychiatry

## 2020-10-10 ENCOUNTER — Ambulatory Visit (INDEPENDENT_AMBULATORY_CARE_PROVIDER_SITE_OTHER): Payer: No Payment, Other | Admitting: Psychiatry

## 2020-10-10 ENCOUNTER — Encounter (HOSPITAL_COMMUNITY): Payer: Self-pay | Admitting: Psychiatry

## 2020-10-10 ENCOUNTER — Other Ambulatory Visit: Payer: Self-pay

## 2020-10-10 ENCOUNTER — Telehealth (HOSPITAL_COMMUNITY): Payer: Self-pay | Admitting: Primary Care

## 2020-10-10 DIAGNOSIS — F319 Bipolar disorder, unspecified: Secondary | ICD-10-CM

## 2020-10-10 DIAGNOSIS — F411 Generalized anxiety disorder: Secondary | ICD-10-CM | POA: Diagnosis not present

## 2020-10-10 MED ORDER — OXCARBAZEPINE 300 MG PO TABS: 300.0000 mg | ORAL_TABLET | Freq: Two times a day (BID) | ORAL | 2 refills | Status: DC

## 2020-10-10 MED ORDER — HYDROXYZINE PAMOATE 50 MG PO CAPS: 50.0000 mg | ORAL_CAPSULE | ORAL | 2 refills | Status: DC | PRN

## 2020-10-10 MED ORDER — PROPRANOLOL HCL 20 MG PO TABS: ORAL_TABLET | ORAL | 2 refills | Status: DC

## 2020-10-10 MED ORDER — QUETIAPINE FUMARATE 400 MG PO TABS: 800.0000 mg | ORAL_TABLET | Freq: Every day | ORAL | 2 refills | Status: DC

## 2020-10-10 MED ORDER — DULOXETINE HCL 60 MG PO CPEP: 60.0000 mg | ORAL_CAPSULE | Freq: Two times a day (BID) | ORAL | 2 refills | Status: DC

## 2020-10-10 MED ORDER — BENZTROPINE MESYLATE 1 MG PO TABS: 1.0000 mg | ORAL_TABLET | Freq: Two times a day (BID) | ORAL | 2 refills | Status: DC

## 2020-10-10 NOTE — Telephone Encounter (Signed)
Care Management - Outpatient   Patient requests assistance with housing resources.  Patient reports that she is will be served with eviction papers on the October 16, 2020.   Writer provided patient with the following resources:   [facility] [address] [place], [place] [address] Phone: [phone] Email: [email or web address] Website: [email or web address]    [facility] Rental Assistance Portal [email or web address] Call the Neighborhood Development Department at [phone] to talk with a program representative from 9 am to 6 pm Mondays through Fridays.

## 2020-10-10 NOTE — Progress Notes (Signed)
BH MD/PA/NP OP Progress Note  10/10/2020 12:58 PM Angel Hughes  MRN:  [id number]  Chief Complaint: "My meds are working but there is a lot going on"  HPI: 60 year old female seen today for follow up psychiatric evaluation.  She has a psychiatric history of  bipolar disorder and anxiety. She is currently being managed on Seroquel 800 mg HS, Trileptal 300 mg twice daily, hydroxyzine 50 mg four times daily, and Cymbalta 60 mg twice daily. She notes her medications are effective in managing her psychiatric conditions.  Today patient is well groomed, pleasant, cooperative, engaged in conversation and maintain eye contact. She notes that her medications effective however notes that she ran going through a lot.  Patient reports increased life stressors (getting tags for her car, rent, health, finances, etc.) and no social support. She notes that she was apart of the [employer] program but reports that she may not receive funding anymore. She also notes that social services maybe "taking papers out on me". Patient did not elaborate on what this ment. Patient notes that she would like to start working. Provider referred patient to the care management team for further resources.  Patient describes their mood as depressed and overwhelmed. Patient reports a recent decrease to her short-term memory which she does not attribute to medication. Patient endorses feeling overly tired despite sleeping 6 hours a night and no difficulty falling asleep. She notes that she will see a neurologist on January 24th for further evaluation.  Although she endorsed feeling depressed she request that her medications not be readjusted.   No Medications changes made today. Patient agreeable to continue all medications as prescribed. She will follow up with provider in 3 months. No other concerns noted at this time.   Visit Diagnosis:    ICD-10-CM   1. Bipolar disorder with depression (HCC)  F31.9 benztropine (COGENTIN) 1 MG tablet     DULoxetine (CYMBALTA) 60 MG capsule    Oxcarbazepine (TRILEPTAL) 300 MG tablet    QUEtiapine (SEROQUEL) 400 MG tablet  2. Generalized anxiety disorder  F41.1 DULoxetine (CYMBALTA) 60 MG capsule    hydrOXYzine (VISTARIL) 50 MG capsule    Past Psychiatric History: bipolar disorder and anxiety  Past Medical History:  Past Medical History:  Diagnosis Date  . Allergic rhinitis   . Anemia   . Anxiety   . Arthritis   . Bipolar disorder ([place])   . Depression   . GERD (gastroesophageal reflux disease)   . Hyperlipemia   . Hypertension    pt states that she has no hx of elevated BP, on med for hot flashes  . Obesity     Past Surgical History:  Procedure Laterality Date  . ABDOMINAL HYSTERECTOMY    . CESAREAN SECTION    . WRIST SURGERY     cyst  . Z-PLASTY REPAIR      Family Psychiatric History: Mother dementia Family History:  Family History  Problem Relation Age of Onset  . Diabetes Paternal Grandmother   . Hypertension Paternal Grandmother   . Diabetes Maternal Grandmother   . Hypertension Maternal Grandmother   . Diabetes Mother   . Hypertension Mother   . Diabetes Brother   . Diabetes Sister   . Heart disease Sister   . Hypertension Sister     Social History:  Social History   Socioeconomic History  . Marital status: Legally Separated    Spouse name: Not on file  . Number of children: Not on file  . Years  of education: Not on file  . Highest education level: Not on file  Occupational History  . Not on file  Tobacco Use  . Smoking status: Current Every Day Smoker    Packs/day: 0.50    Years: 34.00    Pack years: 17.00  . Smokeless tobacco: Never Used  Substance and Sexual Activity  . Alcohol use: Yes    Alcohol/week: 0.0 standard drinks    Comment: occassionally  . Drug use: No  . Sexual activity: Not on file  Other Topics Concern  . Not on file  Social History Narrative  . Not on file   Social Determinants of Health   Financial Resource Strain:  Not on file  Food Insecurity: Not on file  Transportation Needs: Not on file  Physical Activity: Not on file  Stress: Not on file  Social Connections: Not on file    Allergies: No Known Allergies  Metabolic Disorder Labs: Lab Results  Component Value Date   HGBA1C 6.3 (A) 02/14/2020   MPG 126 (H) 06/01/2013   No results found for: PROLACTIN Lab Results  Component Value Date   CHOL 261 (H) 06/01/2013   TRIG 136 06/01/2013   HDL 47 06/01/2013   CHOLHDL 5.6 06/01/2013   VLDL 27 06/01/2013   LDLCALC 187 (H) 06/01/2013   LDLCALC 156 (H) 02/19/2010   Lab Results  Component Value Date   TSH 0.694 02/19/2010    Therapeutic Level Labs: No results found for: LITHIUM No results found for: VALPROATE No components found for:  CBMZ  Current Medications: Current Outpatient Medications  Medication Sig Dispense Refill  . benztropine (COGENTIN) 1 MG tablet Take 1 tablet (1 mg total) by mouth 2 (two) times daily. 60 tablet 2  . DULoxetine (CYMBALTA) 60 MG capsule Take 1 capsule (60 mg total) by mouth 2 (two) times daily. 60 capsule 2  . hydrOXYzine (VISTARIL) 50 MG capsule Take 1 capsule (50 mg total) by mouth every 4 (four) hours as needed. 120 capsule 2  . Oxcarbazepine (TRILEPTAL) 300 MG tablet Take 1 tablet (300 mg total) by mouth 2 (two) times daily. 60 tablet 2  . propranolol (INDERAL) 20 MG tablet TAKE 1 TABLET BY MOUTH TWICE A DAY AS NEEDED FOR PANIC, AGITATION, SLEEP AND ANXIETY 60 tablet 2  . QUEtiapine (SEROQUEL) 400 MG tablet Take 2 tablets (800 mg total) by mouth at bedtime. 60 tablet 2   No current facility-administered medications for this visit.     Musculoskeletal: Strength & Muscle Tone: within normal limits Gait & Station: normal Patient leans: N/A  Psychiatric Specialty Exam: Review of Systems  There were no vitals taken for this visit.There is no height or weight on file to calculate BMI.  General Appearance: Well Groomed  Eye Contact:  Good  Speech:   Clear and Coherent and Normal Rate  Volume:  Normal  Mood:  Anxious and Depressed  Affect:  Appropriate and Non-Congruent  Thought Process:  Coherent, Goal Directed and Linear  Orientation:  Full (Time, Place, and Person)  Thought Content: WDL and Logical   Suicidal Thoughts:  No  Homicidal Thoughts:  No  Memory:  Immediate;   Good Recent;   Good Remote;   Good  Judgement:  Good  Insight:  Good  Psychomotor Activity:  Normal  Concentration:  Concentration: Good and Attention Span: Good  Recall:  Good  Fund of Knowledge: Good  Language: Good  Akathisia:  No  Handed:  Right  AIMS (if indicated): Not done  Assets:  Communication Skills Desire for Improvement Housing  ADL's:  Intact  Cognition: WNL  Sleep:  Fair   Screenings: GAD-7   Flowsheet Row Office Visit from 07/13/2020 in [facility] from 01/27/2020 in [facility] Office Visit from 11/17/2019 in [facility] Office Visit from 11/10/2019 in [facility]  Total GAD-7 Score 7 4 17 20     PHQ2-9   [facility] Office Visit from 07/13/2020 in [facility] from 01/27/2020 in [facility] Office Visit from 11/17/2019 in [facility] Office Visit from 11/10/2019 in [facility]  PHQ-2 Total Score 2 0 5 6  PHQ-9 Total Score 7 -- 10 --       Assessment and Plan: Patient endorses symptoms of anxiety and depression related to life stressors. At this time she notes that her medications are effective in managing her psychiatric conditions. No medication adjustments made today.  1. Bipolar disorder with depression ([place])  Continue- benztropine (COGENTIN) 1 MG tablet; Take 1 tablet (1 mg total) by mouth 2 (two) times daily.  Dispense: 60 tablet; Refill: 2 Continue- DULoxetine (CYMBALTA) 60 MG capsule; Take 1 capsule (60 mg total) by  mouth 2 (two) times daily.  Dispense: 60 capsule; Refill: 2 Continue- Oxcarbazepine (TRILEPTAL) 300 MG tablet; Take 1 tablet (300 mg total) by mouth 2 (two) times daily.  Dispense: 60 tablet; Refill: 2 Continue- QUEtiapine (SEROQUEL) 400 MG tablet; Take 2 tablets (800 mg total) by mouth at bedtime.  Dispense: 60 tablet; Refill: 2  2. Generalized anxiety disorder  Continue- DULoxetine (CYMBALTA) 60 MG capsule; Take 1 capsule (60 mg total) by mouth 2 (two) times daily.  Dispense: 60 capsule; Refill: 2 Continue- hydrOXYzine (VISTARIL) 50 MG capsule; Take 1 capsule (50 mg total) by mouth every 4 (four) hours as needed.  Dispense: 120 capsule; Refill: 2  Follow up in 3 months   Salley Slaughter, NP 10/10/2020, 12:58 PM

## 2020-10-23 ENCOUNTER — Other Ambulatory Visit: Payer: Self-pay

## 2020-10-23 ENCOUNTER — Ambulatory Visit (INDEPENDENT_AMBULATORY_CARE_PROVIDER_SITE_OTHER): Payer: Self-pay | Admitting: Neurology

## 2020-10-23 ENCOUNTER — Encounter: Payer: Self-pay | Admitting: Neurology

## 2020-10-23 ENCOUNTER — Other Ambulatory Visit (INDEPENDENT_AMBULATORY_CARE_PROVIDER_SITE_OTHER): Payer: Self-pay

## 2020-10-23 VITALS — BP 145/83 | HR 92 | Ht 63.0 in | Wt 176.8 lb

## 2020-10-23 DIAGNOSIS — R413 Other amnesia: Secondary | ICD-10-CM

## 2020-10-23 DIAGNOSIS — R55 Syncope and collapse: Secondary | ICD-10-CM

## 2020-10-23 LAB — TSH: TSH: 0.86 u[IU]/mL (ref 0.35–4.50)

## 2020-10-23 LAB — VITAMIN B12: Vitamin B-12: 164 pg/mL — ABNORMAL LOW (ref 211–911)

## 2020-10-23 NOTE — Progress Notes (Signed)
NEUROLOGY CONSULTATION NOTE  Angel Hughes MRN: [id number] DOB: 20-Oct-1960  Referring provider: Juluis Mire, NP Primary care provider: Juluis Mire, NP  Reason for consult:  syncope   Thank you for your kind referral of Angel Hughes for consultation of the above symptoms. Although her history is well known to you, please allow me to reiterate it for the purpose of our medical record. She is alone in the office today. Records and images were personally reviewed where available.   HISTORY OF PRESENT ILLNESS: This is a 60 year old right-handed woman with a history of hypertension, anxiety, depression, bipolar disorder, presenting for evaluation of syncope. There is an ER visit on 05/12/20 where she was found unresponsive at home. Family threw some water on her to try to wake her up. She admitted to drinking alcohol and taking one blue pill "for her head" but unable to further elaborate and would fall asleep frequently during the ER evaluation. She had a head CT without contrast which I personally reviewed, no acute changes seen. EtOH level was 72. Symptoms felt due to intoxication/ingestion. She was seen by her PCP on 07/13/20 for syncope. She reported 3 incidents so she asked for evaluation. The first occurred while she was sitting with her neighbor, there was no warning, she just went out and fell to the ground, no convulsive activity noted, no tongue bite or incontinence. She recalls waking up feeling confused, no headache or dizziness. A month later, she was again sitting on the table and fell, busting her head. A few weeks later, she again passed out and her neighbor said they could not get her to come to. She was seen by her PCP and referred to Neurology.  She lives alone. She denies any staring/unresponsive episodes. She denies any olfactory/gustatory hallucinations, deja vu, rising epigastric sensation, focal numbness/tingling/weakness. Sometimes her hands twitch. She feels  palpitations frequently, yesterday she woke up nervous and shaking with her heart beating fast. Sometimes she wakes up feeling this way. She is concerned that she "got some dementia." She would be talking and forget what she is talking about. She is constantly losing things. She would look at the TV and can't tell what it is about. She has gotten lost driving, she has had to pull over and get her mind together as to where she is going, then as soon as she got it together, she is lost again. This happened 6 months ago. She occasionally forgets her medications. She denies missing bill payments. She reports out of body experiences and sees Psychiatry, during these episodes she is going through the motions but it does not feel like "I'm here." She states "I'm existing but for what." She feels her medications are helping and are just right. She endorses drinking a glass of wine every now and then. She denies any headaches, dizziness, diplopia, bowel/bladder dysfunction. She has tingling in her fingers and feet sometimes. She has back pain. She reports feeling bloated and some tenderness in the epigastric region. Sometimes she cannot swallow and gets scared, taking a deep breath. She reports being under a lot of stress. Her mother has dementia. She had a normal birth and early development.  There is no history of febrile convulsions, CNS infections such as meningitis/encephalitis, significant traumatic brain injury, neurosurgical procedures, or family history of seizures.    PAST MEDICAL HISTORY: Past Medical History:  Diagnosis Date  . Allergic rhinitis   . Anemia   . Anxiety   . Arthritis   .  Bipolar disorder ([place])   . Depression   . GERD (gastroesophageal reflux disease)   . Hyperlipemia   . Hypertension    pt states that she has no hx of elevated BP, on med for hot flashes  . Obesity     PAST SURGICAL HISTORY: Past Surgical History:  Procedure Laterality Date  . ABDOMINAL HYSTERECTOMY    .  CESAREAN SECTION    . WRIST SURGERY     cyst  . Z-PLASTY REPAIR      MEDICATIONS: Current Outpatient Medications on File Prior to Visit  Medication Sig Dispense Refill  . benztropine (COGENTIN) 1 MG tablet Take 1 tablet (1 mg total) by mouth 2 (two) times daily. 60 tablet 2  . DULoxetine (CYMBALTA) 60 MG capsule Take 1 capsule (60 mg total) by mouth 2 (two) times daily. 60 capsule 2  . hydrOXYzine (VISTARIL) 50 MG capsule Take 1 capsule (50 mg total) by mouth every 4 (four) hours as needed. 120 capsule 2  . Oxcarbazepine (TRILEPTAL) 300 MG tablet Take 1 tablet (300 mg total) by mouth 2 (two) times daily. 60 tablet 2  . propranolol (INDERAL) 20 MG tablet TAKE 1 TABLET BY MOUTH TWICE A DAY AS NEEDED FOR PANIC, AGITATION, SLEEP AND ANXIETY 60 tablet 2  . QUEtiapine (SEROQUEL) 400 MG tablet Take 2 tablets (800 mg total) by mouth at bedtime. 60 tablet 2   No current facility-administered medications on file prior to visit.    ALLERGIES: No Known Allergies  FAMILY HISTORY: Family History  Problem Relation Age of Onset  . Diabetes Paternal Grandmother   . Hypertension Paternal Grandmother   . Diabetes Maternal Grandmother   . Hypertension Maternal Grandmother   . Diabetes Mother   . Hypertension Mother   . Diabetes Brother   . Diabetes Sister   . Heart disease Sister   . Hypertension Sister     SOCIAL HISTORY: Social History   Socioeconomic History  . Marital status: Legally Separated    Spouse name: Not on file  . Number of children: Not on file  . Years of education: Not on file  . Highest education level: Not on file  Occupational History  . Not on file  Tobacco Use  . Smoking status: Current Every Day Smoker    Packs/day: 0.50    Years: 34.00    Pack years: 17.00  . Smokeless tobacco: Never Used  Vaping Use  . Vaping Use: Never used  Substance and Sexual Activity  . Alcohol use: Yes    Alcohol/week: 0.0 standard drinks    Comment: occassionally  . Drug use: No   . Sexual activity: Not on file  Other Topics Concern  . Not on file  Social History Narrative   Right handed    Lives alone    Social Determinants of Health   Financial Resource Strain: Not on file  Food Insecurity: Not on file  Transportation Needs: Not on file  Physical Activity: Not on file  Stress: Not on file  Social Connections: Not on file  Intimate Partner Violence: Not on file     PHYSICAL EXAM: Vitals:   10/23/20 1032  BP: (!) 145/83  Pulse: 92  SpO2: 98%   General: No acute distress Head:  Normocephalic/atraumatic Skin/Extremities: No rash, no edema Neurological Exam: Mental status: alert and oriented to person, place, and time, no dysarthria or aphasia, Fund of knowledge is appropriate.  Recent and remote memory are intact, 3/3 delayed recall.  Attention and concentration are  reduced, 2/5 WORLD backwards.  Cranial nerves: CN I: not tested CN II: pupils equal, round and reactive to light, visual fields intact CN III, IV, VI:  full range of motion, no nystagmus, no ptosis CN V: facial sensation intact CN VII: upper and lower face symmetric CN VIII: hearing intact to conversation Bulk & Tone: normal, no fasciculations. Motor: 5/5 throughout with no pronator drift. Sensation: intact to light touch, cold,  vibration sense.  No extinction to double simultaneous stimulation.  Romberg test negative Deep Tendon Reflexes: +2 throughout Cerebellar: no incoordination on finger to nose testing Gait: narrow-based and steady, able to tandem walk adequately. Tremor: none   IMPRESSION: This is a 60 year old right-handed woman with a history of hypertension, anxiety, depression, bipolar disorder, presenting for evaluation of syncope. She denies any further syncopal episodes since October 2021, at that time she reported 3 syncopal episodes within a 1-2 month period. Etiology of syncopal episodes unclear, with broad differential. From a neurological standpoint, Head CT in  August 2021 unremarkable. Unable to obtain MRI due to face piercings. A 1-hour EEG will be ordered. She will be referred to Cardiology for evaluation of syncope, she reports palpitations as well. She is also reporting some cognitive issues, which may relate to underlying psychiatric condition/stress. Consider Neuropsychological evaluation if EEG normal. [place] driving laws were discussed with the patient, and she knows to stop driving after an episode of loss of consciousness, until 6 months event-free. Discuss stomach concerns with PCP. Follow-up after tests, she knows to call for any changes.   Thank you for allowing me to participate in the care of this patient. Please do not hesitate to call for any questions or concerns.   Ellouise Newer, M.D.  CC: Juluis Mire, NP

## 2020-10-23 NOTE — Patient Instructions (Signed)
1. Schedule 1-hour EEG   2. We will have to see if you can get an MRI brain with and without contrast  3. Refer to Cardiology for evaluation of syncope  4. Continue follow-up with Psychiatry  5. It is prudent to recommend that all persons should be free of syncopal episodes (passing out episodes) for at least six months to be granted the driving privilege." ([employer], Second Edition, Medical Review Branch, Engineer, site, Division of [employer], [employer], July 2004)   6. Follow-up after tests, call for any changes

## 2020-10-25 ENCOUNTER — Telehealth: Payer: Self-pay

## 2020-10-25 NOTE — Telephone Encounter (Signed)
Pt called no answer left a voice mail for pt to call the office back  

## 2020-10-25 NOTE — Telephone Encounter (Signed)
-----   Message from Karen M Aquino, MD sent at 10/24/2020 10:45 AM EST ----- Pls let patient know B12 level is low. Would recommend B12 injections, patient will need 1000 mcg daily for 1 week, then weekly for 1 month, then monthly for a year. Would it be easier/closer for her to do this with her PCP or would she like us to do it? Other option is we teach her/someone in household to give injections to do at home. Thanks  

## 2020-10-26 ENCOUNTER — Telehealth: Payer: Self-pay

## 2020-10-26 NOTE — Telephone Encounter (Signed)
Pt called no answer left a voice mail to call the office back  °

## 2020-10-26 NOTE — Telephone Encounter (Signed)
-----   Message from Karen M Aquino, MD sent at 10/24/2020 10:45 AM EST ----- Pls let patient know B12 level is low. Would recommend B12 injections, patient will need 1000 mcg daily for 1 week, then weekly for 1 month, then monthly for a year. Would it be easier/closer for her to do this with her PCP or would she like us to do it? Other option is we teach her/someone in household to give injections to do at home. Thanks  

## 2020-10-27 ENCOUNTER — Telehealth: Payer: Self-pay

## 2020-10-27 NOTE — Telephone Encounter (Signed)
Pt called no answer left a voice mail for pt to call the office back  

## 2020-10-27 NOTE — Telephone Encounter (Signed)
-----   Message from Karen M Aquino, MD sent at 10/24/2020 10:45 AM EST ----- Pls let patient know B12 level is low. Would recommend B12 injections, patient will need 1000 mcg daily for 1 week, then weekly for 1 month, then monthly for a year. Would it be easier/closer for her to do this with her PCP or would she like us to do it? Other option is we teach her/someone in household to give injections to do at home. Thanks  

## 2020-11-01 ENCOUNTER — Other Ambulatory Visit: Payer: Self-pay

## 2020-11-01 ENCOUNTER — Ambulatory Visit (INDEPENDENT_AMBULATORY_CARE_PROVIDER_SITE_OTHER): Payer: Self-pay | Admitting: Neurology

## 2020-11-01 DIAGNOSIS — R55 Syncope and collapse: Secondary | ICD-10-CM

## 2020-11-01 DIAGNOSIS — R413 Other amnesia: Secondary | ICD-10-CM

## 2020-11-08 NOTE — Procedures (Signed)
ELECTROENCEPHALOGRAM REPORT  Date of Study: 11/01/2020  Patient's Name: Angel Hughes MRN: [id number] Date of Birth: 1961/09/13  Referring Provider: Dr. Ellouise Newer  Clinical History: This is a 60 year old woman with recurrent syncope.  Medications: COGENTIN 1 MG tablet CYMBALTA 60 MG capsule VISTARIL 50 MG capsule TRILEPTAL 300 MG tablet INDERAL 20 MG tablet SEROQUEL 400 MG tablet   Technical Summary: A multichannel digital 1-hour EEG recording measured by the international 10-20 system with electrodes applied with paste and impedances below 5000 ohms performed in our laboratory with EKG monitoring in an awake and asleep patient.  Hyperventilation was not performed. Photic stimulation was performed.  The digital EEG was referentially recorded, reformatted, and digitally filtered in a variety of bipolar and referential montages for optimal display.    Description: The patient is awake and asleep during the recording.  During maximal wakefulness, there is a symmetric, medium voltage 10 Hz posterior dominant rhythm that attenuates with eye opening.  The record is symmetric.  During drowsiness and stage I sleep, there is an increase in theta slowing of the background, at times sharply contoured without clear epileptogenic potential.  Vertex waves were seen. Photic stimulation did not elicit any abnormalities.  There were no epileptiform discharges or electrographic seizures seen.    EKG lead was unremarkable.  Impression: This 1-hour awake and asleep EEG is within normal limits.  Clinical Correlation: A normal EEG does not exclude a clinical diagnosis of epilepsy.  If further clinical questions remain, prolonged EEG may be helpful.  Clinical correlation is advised.   Ellouise Newer, M.D.

## 2020-11-09 ENCOUNTER — Encounter: Payer: Self-pay | Admitting: Gastroenterology

## 2020-11-15 ENCOUNTER — Telehealth: Payer: Self-pay

## 2020-11-15 ENCOUNTER — Encounter: Payer: Self-pay | Admitting: General Practice

## 2020-11-15 DIAGNOSIS — E559 Vitamin D deficiency, unspecified: Secondary | ICD-10-CM

## 2020-11-15 MED ORDER — "SYRINGE/NEEDLE (DISP) 22G X 1-1/2"" 3 ML MISC": 0 refills | Status: DC

## 2020-11-15 MED ORDER — BD SHARPS CONTAINER HOME MISC: 0 refills | Status: DC

## 2020-11-15 MED ORDER — VITAMIN DEFICIENCY SYSTEM-B12 1000 MCG/ML IJ KIT: PACK | INTRAMUSCULAR | 0 refills | Status: DC

## 2020-11-15 NOTE — Telephone Encounter (Signed)
Pt called in stated she got her letter in the mail we had been trying to reach her, pt was informed that B12 level is low. Would recommend B12 injections, patient will need 1000 mcg daily for 1 week, then weekly for 1 month, then monthly for a year. Pt stated that she would like to come in for pt education on how to give b12 injection

## 2020-11-15 NOTE — Telephone Encounter (Signed)
-----   Message from Cameron Sprang, MD sent at 10/24/2020 10:45 AM EST ----- Pls let patient know B12 level is low. Would recommend B12 injections, patient will need 1000 mcg daily for 1 week, then weekly for 1 month, then monthly for a year. Would it be easier/closer for her to do this with her PCP or would she like [place] to do it? Other option is we teach her/someone in household to give injections to do at home. Thanks

## 2020-11-21 ENCOUNTER — Ambulatory Visit (INDEPENDENT_AMBULATORY_CARE_PROVIDER_SITE_OTHER): Payer: Self-pay

## 2020-11-21 ENCOUNTER — Other Ambulatory Visit: Payer: Self-pay

## 2020-11-21 DIAGNOSIS — E559 Vitamin D deficiency, unspecified: Secondary | ICD-10-CM

## 2020-11-21 MED ORDER — CYANOCOBALAMIN 1000 MCG/ML IJ SOLN
1000.0000 ug | Freq: Once | INTRAMUSCULAR | Status: AC
  Administered 2020-11-21: 1000 ug via INTRAMUSCULAR

## 2020-11-21 NOTE — Progress Notes (Signed)
Pt came in for B12 injection teaching, and to get 1st injection,  Pt verbalized understanding of teaching and talked through how to do injection. Pt tolerated injection to rt thigh, no adverse reaction, pt advised that if she can not give her self injection to call the office to set up another nurse visit that we will give her, her b12 injections, pt verbalized understanding,

## 2020-11-24 ENCOUNTER — Other Ambulatory Visit: Payer: Self-pay | Admitting: Neurology

## 2020-11-24 DIAGNOSIS — E559 Vitamin D deficiency, unspecified: Secondary | ICD-10-CM

## 2021-01-08 ENCOUNTER — Encounter (HOSPITAL_COMMUNITY): Payer: No Payment, Other | Admitting: Psychiatry

## 2021-01-17 ENCOUNTER — Encounter (HOSPITAL_COMMUNITY): Payer: Self-pay | Admitting: Psychiatry

## 2021-01-17 ENCOUNTER — Telehealth (HOSPITAL_COMMUNITY): Payer: No Payment, Other | Admitting: Primary Care

## 2021-01-17 ENCOUNTER — Other Ambulatory Visit: Payer: Self-pay

## 2021-01-17 ENCOUNTER — Ambulatory Visit (INDEPENDENT_AMBULATORY_CARE_PROVIDER_SITE_OTHER): Payer: No Payment, Other | Admitting: Psychiatry

## 2021-01-17 ENCOUNTER — Encounter (HOSPITAL_COMMUNITY): Payer: No Payment, Other | Admitting: Psychiatry

## 2021-01-17 DIAGNOSIS — F319 Bipolar disorder, unspecified: Secondary | ICD-10-CM | POA: Diagnosis not present

## 2021-01-17 DIAGNOSIS — F411 Generalized anxiety disorder: Secondary | ICD-10-CM | POA: Diagnosis not present

## 2021-01-17 MED ORDER — PROPRANOLOL HCL 20 MG PO TABS: ORAL_TABLET | ORAL | 2 refills | Status: DC

## 2021-01-17 MED ORDER — OXCARBAZEPINE 300 MG PO TABS: 300.0000 mg | ORAL_TABLET | Freq: Two times a day (BID) | ORAL | 2 refills | Status: DC

## 2021-01-17 MED ORDER — QUETIAPINE FUMARATE 400 MG PO TABS: 800.0000 mg | ORAL_TABLET | Freq: Every day | ORAL | 2 refills | Status: DC

## 2021-01-17 MED ORDER — DULOXETINE HCL 60 MG PO CPEP
60.0000 mg | ORAL_CAPSULE | Freq: Two times a day (BID) | ORAL | 2 refills | Status: DC
Start: 2021-01-17 — End: 2021-04-18

## 2021-01-17 MED ORDER — BENZTROPINE MESYLATE 1 MG PO TABS
1.0000 mg | ORAL_TABLET | Freq: Two times a day (BID) | ORAL | 2 refills | Status: DC
Start: 2021-01-17 — End: 2021-04-18

## 2021-01-17 MED ORDER — HYDROXYZINE PAMOATE 50 MG PO CAPS: 50.0000 mg | ORAL_CAPSULE | ORAL | 2 refills | Status: DC | PRN

## 2021-01-17 NOTE — Progress Notes (Signed)
BH MD/PA/NP OP Progress Note  01/17/2021 2:37 PM Angel Hughes  MRN:  [id number]  Chief Complaint:  Chief Complaint    Medication Management    "I am facing eviction and it is really making me more anxious"  HPI: 60 year old female seen today for follow up psychiatric evaluation.  She has a psychiatric history of  bipolar disorder and anxiety. She is currently being managed on Seroquel 800 mg HS, Trileptal 300 mg twice daily, hydroxyzine 50 mg four times daily, and Cymbalta 60 mg twice daily. She notes her medications are effective in managing her psychiatric conditions.  Today patient is well groomed, pleasant, cooperative, engaged in conversation and maintain eye contact. She informed Probation officer that her medications are effective in managing her psychiatric conditions however she notes that she has been under a lot of stress.  She informed Probation officer that she is facing eviction which is making her more anxious.  She asked provider if she was aware of any programs to help with her housing.  Provider referred patient to the care management team for assistance.  Today provider conducted a GAD-7 and patient scored an 11.  Provider also conducted a PHQ-9 and patient scored an 11.  She notes that although she has anxiety and depression she is trying to push forward and use positive coping mechanism.  Today she denies SI/HI/VAH, mania, or paranoia.  She endorses adequate sleep and appetite.  She notes that since her last visit she lost 5 pounds.  No Medications changes made today. Patient agreeable to continue all medications as prescribed. She will follow up with provider in 3 months. No other concerns noted at this time.   Visit Diagnosis:    ICD-10-CM   1. Bipolar disorder with depression (HCC)  F31.9 DULoxetine (CYMBALTA) 60 MG capsule    Oxcarbazepine (TRILEPTAL) 300 MG tablet    QUEtiapine (SEROQUEL) 400 MG tablet    benztropine (COGENTIN) 1 MG tablet  2. Generalized anxiety disorder  F41.1  DULoxetine (CYMBALTA) 60 MG capsule    hydrOXYzine (VISTARIL) 50 MG capsule    Past Psychiatric History: bipolar disorder and anxiety  Past Medical History:  Past Medical History:  Diagnosis Date  . Allergic rhinitis   . Anemia   . Anxiety   . Arthritis   . Bipolar disorder ([place])   . Depression   . GERD (gastroesophageal reflux disease)   . Hyperlipemia   . Hypertension    pt states that she has no hx of elevated BP, on med for hot flashes  . Obesity     Past Surgical History:  Procedure Laterality Date  . ABDOMINAL HYSTERECTOMY    . CESAREAN SECTION    . WRIST SURGERY     cyst  . Z-PLASTY REPAIR      Family Psychiatric History: Mother dementia Family History:  Family History  Problem Relation Age of Onset  . Diabetes Paternal Grandmother   . Hypertension Paternal Grandmother   . Diabetes Maternal Grandmother   . Hypertension Maternal Grandmother   . Diabetes Mother   . Hypertension Mother   . Diabetes Brother   . Diabetes Sister   . Heart disease Sister   . Hypertension Sister     Social History:  Social History   Socioeconomic History  . Marital status: Legally Separated    Spouse name: Not on file  . Number of children: Not on file  . Years of education: Not on file  . Highest education level: Not on file  Occupational History  . Not on file  Tobacco Use  . Smoking status: Current Every Day Smoker    Packs/day: 0.50    Years: 34.00    Pack years: 17.00  . Smokeless tobacco: Never Used  Vaping Use  . Vaping Use: Never used  Substance and Sexual Activity  . Alcohol use: Yes    Alcohol/week: 0.0 standard drinks    Comment: occassionally  . Drug use: No  . Sexual activity: Not on file  Other Topics Concern  . Not on file  Social History Narrative   Right handed    Lives alone    Social Determinants of Health   Financial Resource Strain: Not on file  Food Insecurity: Not on file  Transportation Needs: Not on file  Physical Activity: Not  on file  Stress: Not on file  Social Connections: Not on file    Allergies: No Known Allergies  Metabolic Disorder Labs: Lab Results  Component Value Date   HGBA1C 6.3 (A) 02/14/2020   MPG 126 (H) 06/01/2013   No results found for: PROLACTIN Lab Results  Component Value Date   CHOL 261 (H) 06/01/2013   TRIG 136 06/01/2013   HDL 47 06/01/2013   CHOLHDL 5.6 06/01/2013   VLDL 27 06/01/2013   LDLCALC 187 (H) 06/01/2013   LDLCALC 156 (H) 02/19/2010   Lab Results  Component Value Date   TSH 0.86 10/23/2020   TSH 0.694 02/19/2010    Therapeutic Level Labs: No results found for: LITHIUM No results found for: VALPROATE No components found for:  CBMZ  Current Medications: Current Outpatient Medications  Medication Sig Dispense Refill  . BD Sharps Container Home MISC Place used needles and syringes in sharps container 1 each 0  . Cyanocobalamin (VITAMIN DEFICIENCY SYSTEM-B12) 1000 MCG/ML KIT 1000 mcg daily for 1 week, then weekly for 1 month, then monthly for a year 1 kit 0  . SYRINGE-NEEDLE, DISP, 3 ML 22G X 1-1/2" 3 ML MISC Use a new needle and syringe for each injection. 50 each 0  . benztropine (COGENTIN) 1 MG tablet Take 1 tablet (1 mg total) by mouth 2 (two) times daily. 60 tablet 2  . DULoxetine (CYMBALTA) 60 MG capsule Take 1 capsule (60 mg total) by mouth 2 (two) times daily. 60 capsule 2  . hydrOXYzine (VISTARIL) 50 MG capsule Take 1 capsule (50 mg total) by mouth every 4 (four) hours as needed. 120 capsule 2  . Oxcarbazepine (TRILEPTAL) 300 MG tablet Take 1 tablet (300 mg total) by mouth 2 (two) times daily. 60 tablet 2  . propranolol (INDERAL) 20 MG tablet TAKE 1 TABLET BY MOUTH TWICE A DAY AS NEEDED FOR PANIC, AGITATION, SLEEP AND ANXIETY 60 tablet 2  . QUEtiapine (SEROQUEL) 400 MG tablet Take 2 tablets (800 mg total) by mouth at bedtime. 60 tablet 2   No current facility-administered medications for this visit.     Musculoskeletal: Strength & Muscle Tone:  within normal limits Gait & Station: normal Patient leans: N/A  Psychiatric Specialty Exam: Review of Systems  Blood pressure 134/81, pulse 98, height 5' 3"  (1.6 m), weight 174 lb (78.9 kg).Body mass index is 30.82 kg/m.  General Appearance: Well Groomed  Eye Contact:  Good  Speech:  Clear and Coherent and Normal Rate  Volume:  Normal  Mood:  Anxious and Depressed  Affect:  Appropriate and Non-Congruent  Thought Process:  Coherent, Goal Directed and Linear  Orientation:  Full (Time, Place, and Person)  Thought Content: WDL  and Logical   Suicidal Thoughts:  No  Homicidal Thoughts:  No  Memory:  Immediate;   Good Recent;   Good Remote;   Good  Judgement:  Good  Insight:  Good  Psychomotor Activity:  Normal  Concentration:  Concentration: Good and Attention Span: Good  Recall:  Good  Fund of Knowledge: Good  Language: Good  Akathisia:  No  Handed:  Right  AIMS (if indicated): Not done  Assets:  Communication Skills Desire for Improvement Housing  ADL's:  Intact  Cognition: WNL  Sleep:  Fair   Screenings: GAD-7   Flowsheet Row Clinical Support from 01/17/2021 in [facility] Office Visit from 07/13/2020 in [place] from 01/27/2020 in [place] Office Visit from 11/17/2019 in [place] Office Visit from 11/10/2019 in [place]  Total GAD-7 Score 11 7 4 17 20     PHQ2-9   [place] from 01/17/2021 in [facility] Office Visit from 07/13/2020 in [place] from 01/27/2020 in [place] Office Visit from 11/17/2019 in [place] Office Visit from 11/10/2019 in [place]  PHQ-2 Total Score 4 2 0 5 6  PHQ-9 Total Score 11 7 -- 10 --    Flowsheet Row Clinical Support from 01/17/2021 in  [place] No Risk       Assessment and Plan: Patient endorses symptoms of anxiety and depression related to life stressors. At this time she notes that her medications are effective in managing her psychiatric conditions. No medication adjustments made today.  1. Bipolar disorder with depression ([place])  Continue- benztropine (COGENTIN) 1 MG tablet; Take 1 tablet (1 mg total) by mouth 2 (two) times daily.  Dispense: 60 tablet; Refill: 2 Continue- DULoxetine (CYMBALTA) 60 MG capsule; Take 1 capsule (60 mg total) by mouth 2 (two) times daily.  Dispense: 60 capsule; Refill: 2 Continue- Oxcarbazepine (TRILEPTAL) 300 MG tablet; Take 1 tablet (300 mg total) by mouth 2 (two) times daily.  Dispense: 60 tablet; Refill: 2 Continue- QUEtiapine (SEROQUEL) 400 MG tablet; Take 2 tablets (800 mg total) by mouth at bedtime.  Dispense: 60 tablet; Refill: 2  2. Generalized anxiety disorder  Continue- DULoxetine (CYMBALTA) 60 MG capsule; Take 1 capsule (60 mg total) by mouth 2 (two) times daily.  Dispense: 60 capsule; Refill: 2 Continue- hydrOXYzine (VISTARIL) 50 MG capsule; Take 1 capsule (50 mg total) by mouth every 4 (four) hours as needed.  Dispense: 120 capsule; Refill: 2  Follow up in 3 months   Salley Slaughter, NP 01/17/2021, 2:37 PM

## 2021-01-17 NOTE — BH Assessment (Incomplete)
Writer met with patient regarding housing resources due to receiving an eviction letter.   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  Patient does not know how far she is behind on her rent.   Writer assisted the patient in contacting [place] [phone]) to determine how much is needed to stop the eviction process.  Writer was informed that the patient owes $3,176.00 in past and on Jan 28, 2021 the patient will own another month rent of $420.00.   Patient reports that she does not have any money saved up to go towards the rent.   Writer referred patient to the following resources:  [facility] [phone])  Is no longer assisting with financial rental assistance.   [facility] [phone]) does not have any funds to be able assist.   [employer] [phone]) only able to leave a message.   [facility] [phone]) Writer was informed that they are only able to assist with patient in the [facility] area.    Patient will follow up with Social Services tomorrow at 3pm.

## 2021-01-24 ENCOUNTER — Other Ambulatory Visit: Payer: Self-pay | Admitting: Obstetrics and Gynecology

## 2021-01-24 DIAGNOSIS — Z1231 Encounter for screening mammogram for malignant neoplasm of breast: Secondary | ICD-10-CM

## 2021-02-22 ENCOUNTER — Ambulatory Visit: Payer: Medicaid Other

## 2021-02-27 ENCOUNTER — Ambulatory Visit: Payer: Medicaid Other

## 2021-04-18 ENCOUNTER — Other Ambulatory Visit: Payer: Self-pay

## 2021-04-18 ENCOUNTER — Ambulatory Visit (INDEPENDENT_AMBULATORY_CARE_PROVIDER_SITE_OTHER): Payer: No Payment, Other | Admitting: Psychiatry

## 2021-04-18 ENCOUNTER — Encounter (HOSPITAL_COMMUNITY): Payer: Self-pay | Admitting: Psychiatry

## 2021-04-18 ENCOUNTER — Telehealth (HOSPITAL_COMMUNITY): Payer: Self-pay | Admitting: Primary Care

## 2021-04-18 DIAGNOSIS — F411 Generalized anxiety disorder: Secondary | ICD-10-CM | POA: Diagnosis not present

## 2021-04-18 DIAGNOSIS — F319 Bipolar disorder, unspecified: Secondary | ICD-10-CM

## 2021-04-18 MED ORDER — BENZTROPINE MESYLATE 1 MG PO TABS: 1.0000 mg | ORAL_TABLET | Freq: Two times a day (BID) | ORAL | 3 refills | Status: DC

## 2021-04-18 MED ORDER — OXCARBAZEPINE 300 MG PO TABS: 300.0000 mg | ORAL_TABLET | Freq: Two times a day (BID) | ORAL | 3 refills | Status: DC

## 2021-04-18 MED ORDER — DULOXETINE HCL 60 MG PO CPEP: 60.0000 mg | ORAL_CAPSULE | Freq: Two times a day (BID) | ORAL | 3 refills | Status: DC

## 2021-04-18 MED ORDER — HYDROXYZINE PAMOATE 50 MG PO CAPS: 50.0000 mg | ORAL_CAPSULE | ORAL | 3 refills | Status: DC | PRN

## 2021-04-18 MED ORDER — QUETIAPINE FUMARATE 400 MG PO TABS: 800.0000 mg | ORAL_TABLET | Freq: Every day | ORAL | 3 refills | Status: DC

## 2021-04-18 NOTE — Progress Notes (Signed)
BH MD/PA/NP OP Progress Note  04/18/2021 2:36 PM Angel Hughes  MRN:  [id number]  Chief Complaint: "I feel like a failure" Chief Complaint   Medication Management     HPI: 60 year old female seen today for follow up psychiatric evaluation.  She has a psychiatric history of  bipolar disorder and anxiety. She is currently being managed on Seroquel 800 mg HS, Trileptal 300 mg twice daily, hydroxyzine 50 mg four times daily, and Cymbalta 60 mg twice daily. She notes her medications are effective in managing her psychiatric conditions.   Today patient is tearful, well groomed, pleasant, cooperative, engaged in conversation and maintain eye contact. She informed Probation officer that she feels like a failure.  She notes that she and her boyfriend of 9 years broke up after he accused her of sleeping with other men.  She notes that he kicked her out of their home and now she is concerned about her living situation.  She notes that she commutes an hour to live with a family member occasionally.  She also informed Probation officer that work has been stressful.  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  Patient notes that her psychiatric medications manage her conditions however notes that she is having difficulty coping with life stressors.  Today provider conducted a GAD-7 and patient scored 18, at her last visit she scored a 11.  Provider also conducted a PHQ-9 and patient scored a 19, at her last visit she scored an 11.  She notes that she sleeps well on Seroquel and endorses having an adequate appetite.   Today she denies SI/HI/VAH, mania, or paranoia.    Provider recommended patient starting therapy however she notes that she is not interested.  She also notes that she does not want her medication change.  No medications changes made today. Patient agreeable to continue all medications as prescribed.  Provider referred patient to the  care management team to help assist with housing.  Provider also gave patient the number to rapid rehousing.  She will follow up with provider in 3 months. No other concerns noted at this time.   Visit Diagnosis:    ICD-10-CM   1. Bipolar disorder with depression (HCC)  F31.9 QUEtiapine (SEROQUEL) 400 MG tablet    Oxcarbazepine (TRILEPTAL) 300 MG tablet    DULoxetine (CYMBALTA) 60 MG capsule    benztropine (COGENTIN) 1 MG tablet    2. Generalized anxiety disorder  F41.1 hydrOXYzine (VISTARIL) 50 MG capsule    DULoxetine (CYMBALTA) 60 MG capsule      Past Psychiatric History: bipolar disorder and anxiety  Past Medical History:  Past Medical History:  Diagnosis Date   Allergic rhinitis    Anemia    Anxiety    Arthritis    Bipolar disorder ([place])    Depression    GERD (gastroesophageal reflux disease)    Hyperlipemia    Hypertension    pt states that she has no hx of elevated BP, on med for hot flashes   Obesity     Past Surgical History:  Procedure Laterality Date   ABDOMINAL HYSTERECTOMY     CESAREAN SECTION     WRIST SURGERY     cyst   Z-PLASTY REPAIR      Family Psychiatric History: Mother dementia Family History:  Family History  Problem Relation Age of Onset   Diabetes Paternal Grandmother    Hypertension  Paternal Grandmother    Diabetes Maternal Grandmother    Hypertension Maternal Grandmother    Diabetes Mother    Hypertension Mother    Diabetes Brother    Diabetes Sister    Heart disease Sister    Hypertension Sister     Social History:  Social History   Socioeconomic History   Marital status: Legally Separated    Spouse name: Not on file   Number of children: Not on file   Years of education: Not on file   Highest education level: Not on file  Occupational History   Not on file  Tobacco Use   Smoking status: Every Day    Packs/day: 0.50    Years: 34.00    Pack years: 17.00    Types: Cigarettes   Smokeless tobacco: Never  Vaping Use    Vaping Use: Never used  Substance and Sexual Activity   Alcohol use: Yes    Alcohol/week: 0.0 standard drinks    Comment: occassionally   Drug use: No   Sexual activity: Not on file  Other Topics Concern   Not on file  Social History Narrative   Right handed    Lives alone    Social Determinants of Health   Financial Resource Strain: Not on file  Food Insecurity: Not on file  Transportation Needs: Not on file  Physical Activity: Not on file  Stress: Not on file  Social Connections: Not on file    Allergies: No Known Allergies  Metabolic Disorder Labs: Lab Results  Component Value Date   HGBA1C 6.3 (A) 02/14/2020   MPG 126 (H) 06/01/2013   No results found for: PROLACTIN Lab Results  Component Value Date   CHOL 261 (H) 06/01/2013   TRIG 136 06/01/2013   HDL 47 06/01/2013   CHOLHDL 5.6 06/01/2013   VLDL 27 06/01/2013   LDLCALC 187 (H) 06/01/2013   LDLCALC 156 (H) 02/19/2010   Lab Results  Component Value Date   TSH 0.86 10/23/2020   TSH 0.694 02/19/2010    Therapeutic Level Labs: No results found for: LITHIUM No results found for: VALPROATE No components found for:  CBMZ  Current Medications: Current Outpatient Medications  Medication Sig Dispense Refill   BD Sharps Container Home MISC Place used needles and syringes in sharps container 1 each 0   Cyanocobalamin (VITAMIN DEFICIENCY SYSTEM-B12) 1000 MCG/ML KIT 1000 mcg daily for 1 week, then weekly for 1 month, then monthly for a year 1 kit 0   propranolol (INDERAL) 20 MG tablet TAKE 1 TABLET BY MOUTH TWICE A DAY AS NEEDED FOR PANIC, AGITATION, SLEEP AND ANXIETY 60 tablet 2   SYRINGE-NEEDLE, DISP, 3 ML 22G X 1-1/2" 3 ML MISC Use a new needle and syringe for each injection. 50 each 0   benztropine (COGENTIN) 1 MG tablet Take 1 tablet (1 mg total) by mouth 2 (two) times daily. 60 tablet 3   DULoxetine (CYMBALTA) 60 MG capsule Take 1 capsule (60 mg total) by mouth 2 (two) times daily. 60 capsule 3   hydrOXYzine  (VISTARIL) 50 MG capsule Take 1 capsule (50 mg total) by mouth every 4 (four) hours as needed. 120 capsule 3   Oxcarbazepine (TRILEPTAL) 300 MG tablet Take 1 tablet (300 mg total) by mouth 2 (two) times daily. 60 tablet 3   QUEtiapine (SEROQUEL) 400 MG tablet Take 2 tablets (800 mg total) by mouth at bedtime. 60 tablet 3   No current facility-administered medications for this visit.  Musculoskeletal: Strength & Muscle Tone: within normal limits Gait & Station: normal Patient leans: N/A  Psychiatric Specialty Exam: Review of Systems  Blood pressure (!) 126/93, pulse 79, height 5' 3" (1.6 m), weight 172 lb (78 kg).Body mass index is 30.47 kg/m.  General Appearance: Well Groomed  Eye Contact:  Good  Speech:  Clear and Coherent and Normal Rate  Volume:  Normal  Mood:  Anxious and Depressed  Affect:  Appropriate and Congruent  Thought Process:  Coherent, Goal Directed and Linear  Orientation:  Full (Time, Place, and Person)  Thought Content: WDL and Logical   Suicidal Thoughts:  No  Homicidal Thoughts:  No  Memory:  Immediate;   Good Recent;   Good Remote;   Good  Judgement:  Good  Insight:  Good  Psychomotor Activity:  Normal  Concentration:  Concentration: Good and Attention Span: Good  Recall:  Good  Fund of Knowledge: Good  Language: Good  Akathisia:  No  Handed:  Right  AIMS (if indicated): Not done  Assets:  Communication Skills Desire for Improvement Housing  ADL's:  Intact  Cognition: WNL  Sleep:  Good   Screenings: GAD-7    Flowsheet Row Clinical Support from 04/18/2021 in [place] from 01/17/2021 in [facility] Office Visit from 07/13/2020 in [place] from 01/27/2020 in [place] Office Visit from 11/17/2019 in [place]  Total GAD-7 Score _0 PHQ2-9    Flowsheet Row Clinical  Support from 04/18/2021 in [place] from 01/17/2021 in [facility] Office Visit from 07/13/2020 in [place] from 01/27/2020 in [place] Office Visit from 11/17/2019 in [place]  PHQ-2 Total Score _1 0 5  PHQ-9 Total Score _2 -- 10      Flowsheet Row Clinical Support from 01/17/2021 in [place] No Risk        Assessment and Plan: Patient endorses symptoms of anxiety and depression related to life stressors. At this time she notes that her medications are effective in managing her psychiatric conditions. No medication adjustments made today. Provider recommended patient starting therapy however she notes that she is not interested.   Provider referred patient to the care management team to help assist with housing.  Provider also gave patient the number to rapid rehousing.     1. Bipolar disorder with depression ([place])   Continue- benztropine (COGENTIN) 1 MG tablet; Take 1 tablet (1 mg total) by mouth 2 (two) times daily.  Dispense: 60 tablet; Refill: 3 Continue- DULoxetine (CYMBALTA) 60 MG capsule; Take 1 capsule (60 mg total) by mouth 2 (two) times daily.  Dispense: 60 capsule; Refill: 3 Continue- Oxcarbazepine (TRILEPTAL) 300 MG tablet; Take 1 tablet (300 mg total) by mouth 2 (two) times daily.  Dispense: 60 tablet; Refill: 3 Continue- QUEtiapine (SEROQUEL) 400 MG tablet; Take 2 tablets (800 mg total) by mouth at bedtime.  Dispense: 60 tablet; Refill: 3   2. Generalized anxiety disorder   Continue- DULoxetine (CYMBALTA) 60 MG capsule; Take 1 capsule (60 mg total) by mouth 2 (two) times daily.  Dispense: 60 capsule; Refill: 3 Continue- hydrOXYzine (VISTARIL) 50 MG capsule; Take 1 capsule (50 mg total) by mouth every 4 (four) hours  as needed.  Dispense: 120 capsule;  Refill: 3   Follow up in 3 months   Salley Slaughter, NP 04/18/2021, 2:36 PM

## 2021-04-18 NOTE — BH Assessment (Signed)
Writer met with the patient.  Patient reports that she was evicted and is now living with her son and she continues to remain employed.  Probation officer provided referral to affordable senior living in [place], [place]  [facility]  [address]  [place]  [phone] of [place]  [place] [phone]

## 2021-07-25 ENCOUNTER — Encounter (HOSPITAL_COMMUNITY): Payer: Self-pay | Admitting: Psychiatry

## 2021-07-25 ENCOUNTER — Other Ambulatory Visit: Payer: Self-pay

## 2021-07-25 ENCOUNTER — Ambulatory Visit (INDEPENDENT_AMBULATORY_CARE_PROVIDER_SITE_OTHER): Payer: No Payment, Other | Admitting: Psychiatry

## 2021-07-25 DIAGNOSIS — F411 Generalized anxiety disorder: Secondary | ICD-10-CM

## 2021-07-25 DIAGNOSIS — F319 Bipolar disorder, unspecified: Secondary | ICD-10-CM | POA: Diagnosis not present

## 2021-07-25 MED ORDER — DULOXETINE HCL 60 MG PO CPEP
60.0000 mg | ORAL_CAPSULE | Freq: Two times a day (BID) | ORAL | 3 refills | Status: DC
  Filled 2021-07-25 – 2021-08-06 (×2): qty 60, 30d supply, fill #0
  Filled 2021-09-13: qty 60, 30d supply, fill #1

## 2021-07-25 MED ORDER — QUETIAPINE FUMARATE 400 MG PO TABS
800.0000 mg | ORAL_TABLET | Freq: Every day | ORAL | 3 refills | Status: DC
Start: 2021-07-25 — End: 2021-11-13
  Filled 2021-07-25 – 2021-08-06 (×2): qty 60, 30d supply, fill #0
  Filled 2021-09-13: qty 60, 30d supply, fill #1
  Filled 2021-10-24: qty 60, 30d supply, fill #0

## 2021-07-25 MED ORDER — BENZTROPINE MESYLATE 1 MG PO TABS
1.0000 mg | ORAL_TABLET | Freq: Two times a day (BID) | ORAL | 3 refills | Status: DC
  Filled 2021-07-25 – 2021-08-06 (×2): qty 60, 30d supply, fill #0
  Filled 2021-09-13: qty 60, 30d supply, fill #1

## 2021-07-25 MED ORDER — OXCARBAZEPINE 300 MG PO TABS
300.0000 mg | ORAL_TABLET | Freq: Two times a day (BID) | ORAL | 3 refills | Status: DC
  Filled 2021-07-25 – 2021-08-06 (×2): qty 60, 30d supply, fill #0
  Filled 2021-09-13: qty 60, 30d supply, fill #1
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
## 2021-08-01 ENCOUNTER — Other Ambulatory Visit: Payer: Self-pay

## 2021-08-06 ENCOUNTER — Other Ambulatory Visit (HOSPITAL_COMMUNITY): Payer: Self-pay | Admitting: Psychiatry

## 2021-08-06 ENCOUNTER — Other Ambulatory Visit: Payer: Self-pay

## 2021-08-06 ENCOUNTER — Telehealth (HOSPITAL_COMMUNITY): Payer: Self-pay | Admitting: Psychiatry

## 2021-08-06 DIAGNOSIS — F411 Generalized anxiety disorder: Secondary | ICD-10-CM

## 2021-08-06 MED ORDER — HYDROXYZINE PAMOATE 50 MG PO CAPS
50.0000 mg | ORAL_CAPSULE | ORAL | 3 refills | Status: DC | PRN
Start: 2021-08-06 — End: 2021-11-13
  Filled 2021-08-06: qty 120, 20d supply, fill #0
  Filled 2021-09-13: qty 120, 20d supply, fill #1
  Filled 2021-10-24: qty 120, 20d supply, fill #0

## 2021-08-06 MED ORDER — PROPRANOLOL HCL 20 MG PO TABS
ORAL_TABLET | ORAL | 3 refills | Status: DC
  Filled 2021-08-06: qty 60, 30d supply, fill #0
  Filled 2021-09-13: qty 60, 30d supply, fill #1

## 2021-08-06 NOTE — Telephone Encounter (Signed)
Medication refilled and sent to preferred pharmacy

## 2021-08-06 NOTE — Telephone Encounter (Signed)
Patient contacted office to have medication filled and sent to [facility]: hydrOXYzine (VISTARIL) 50 MG capsule, propranolol (INDERAL) 20 MG tablet.

## 2021-08-14 ENCOUNTER — Other Ambulatory Visit: Payer: Self-pay

## 2021-08-14 ENCOUNTER — Ambulatory Visit (INDEPENDENT_AMBULATORY_CARE_PROVIDER_SITE_OTHER): Payer: Self-pay | Admitting: Primary Care

## 2021-08-14 ENCOUNTER — Encounter (INDEPENDENT_AMBULATORY_CARE_PROVIDER_SITE_OTHER): Payer: Self-pay | Admitting: Primary Care

## 2021-08-14 VITALS — BP 140/82 | HR 77 | Temp 97.5°F | Ht 63.0 in | Wt 170.6 lb

## 2021-08-14 DIAGNOSIS — Z1211 Encounter for screening for malignant neoplasm of colon: Secondary | ICD-10-CM

## 2021-08-14 DIAGNOSIS — Z720 Tobacco use: Secondary | ICD-10-CM

## 2021-08-14 DIAGNOSIS — E538 Deficiency of other specified B group vitamins: Secondary | ICD-10-CM

## 2021-08-14 DIAGNOSIS — Z1231 Encounter for screening mammogram for malignant neoplasm of breast: Secondary | ICD-10-CM

## 2021-08-14 DIAGNOSIS — F17210 Nicotine dependence, cigarettes, uncomplicated: Secondary | ICD-10-CM

## 2021-08-14 DIAGNOSIS — R7303 Prediabetes: Secondary | ICD-10-CM

## 2021-08-14 DIAGNOSIS — Z0000 Encounter for general adult medical examination without abnormal findings: Secondary | ICD-10-CM

## 2021-08-14 DIAGNOSIS — Z0001 Encounter for general adult medical examination with abnormal findings: Secondary | ICD-10-CM

## 2021-08-14 DIAGNOSIS — I10 Essential (primary) hypertension: Secondary | ICD-10-CM

## 2021-08-14 LAB — POCT GLYCOSYLATED HEMOGLOBIN (HGB A1C): Hemoglobin A1C: 6.3 % — AB (ref 4.0–5.6)

## 2021-08-14 MED ORDER — AMLODIPINE BESYLATE 10 MG PO TABS
10.0000 mg | ORAL_TABLET | Freq: Every day | ORAL | 1 refills | Status: DC
  Filled 2021-08-14: qty 30, 30d supply, fill #0
  Filled 2021-09-13: qty 30, 30d supply, fill #1
  Filled 2021-10-24: qty 30, 30d supply, fill #0
  Filled 2021-11-23: qty 30, 30d supply, fill #1

## 2021-08-14 NOTE — Progress Notes (Signed)
[place]  Angel Hughes is a 60 y.o. female presents to office today for annual physical exam examination.    Concerns today include: 1. Annual Exam   Occupation: none, Marital status: S, Substance use: alcohol/tobacco  Diet: healthy , Exercise: walking  Last eye exam: unknown ( vision changes)  Last dental exam: unknown Last colonoscopy: FOBT given today  Last mammogram: BCCP Last pap smear: BCCP Refills needed today: no Immunizations needed: Flu Vaccine: yes  Tdap Vaccine: yes  - every 36yr - (<3 lifetime doses or unknown): all wounds -- look up need for Tetanus IG - (>=3 lifetime doses): clean/minor wound if >150yrfrom previous; all other wounds if >5y62yrrom previous Zoster Vaccine: no (those >50yo, once) Pneumonia Vaccine: no (those w/ risk factors) - (<65y78yrth: Immunocompromised, cochlear implant, CSF leak, asplenic, sickle cell, Chronic Renal Failure - (<81yr14yrV-23 only: Heart dz, lung disease, DM, tobacco abuse, alcoholism, cirrhosis/liver disease. - (>81yr)98yrV13 then PPSV23 in 6-12mths;  - (>81yr):52yrat PPSV23 once if pt received prior to 60yo and 45yrs ha22yrassed  Past Medical History:  Diagnosis Date   Allergic rhinitis    Anemia    Anxiety    Arthritis    Bipolar disorder (HCC)    Depression    GERD (gastroesophageal reflux disease)    Hyperlipemia    Hypertension    pt states that she has no hx of elevated BP, on med for hot flashes   Obesity    Social History   Socioeconomic History   Marital status: Legally Separated    Spouse name: Not on file   Number of children: Not on file   Years of education: Not on file   Highest education level: Not on file  Occupational History   Not on file  Tobacco Use   Smoking status: Every Day    Packs/day: 0.50    Years: 34.00    Pack years: 17.00    Types: Cigarettes   Smokeless tobacco: Never  Vaping Use   Vaping Use: Never used  Substance and Sexual Activity   Alcohol  use: Yes    Alcohol/week: 0.0 standard drinks    Comment: occassionally   Drug use: No   Sexual activity: Not on file  Other Topics Concern   Not on file  Social History Narrative   Right handed    Lives alone    Social Determinants of Health   Financial Resource Strain: Not on file  Food Insecurity: Not on file  Transportation Needs: Not on file  Physical Activity: Not on file  Stress: Not on file  Social Connections: Not on file  Intimate Partner Violence: Not on file   Past Surgical History:  Procedure Laterality Date   ABDOMINAL HYSTERECTOMY     CESAREAN SECTION     WRIST SURGERY     cyst   Z-PLASTY REPAIR     Family History  Problem Relation Age of Onset   Diabetes Paternal Grandmother    Hypertension Paternal Grandmother    Diabetes Maternal Grandmother    Hypertension Maternal Grandmother    Diabetes Mother    Hypertension Mother    Diabetes Brother    Diabetes Sister    Heart disease Sister    Hypertension Sister     Current Outpatient Medications:    benztropine (COGENTIN) 1 MG tablet, Take 1 tablet (1 mg total) by mouth 2 (two) times daily., Disp: 60 tablet, Rfl: 3   DULoxetine (CYMBALTA) 60 MG capsule,  Take 1 capsule (60 mg total) by mouth 2 (two) times daily., Disp: 60 capsule, Rfl: 3   hydrOXYzine (VISTARIL) 50 MG capsule, Take 1 capsule (50 mg total) by mouth every 4 (four) hours as needed., Disp: 120 capsule, Rfl: 3   Oxcarbazepine (TRILEPTAL) 300 MG tablet, Take 1 tablet (300 mg total) by mouth 2 (two) times daily., Disp: 60 tablet, Rfl: 3   propranolol (INDERAL) 20 MG tablet, TAKE 1 TABLET BY MOUTH TWICE A DAY AS NEEDED FOR PANIC, AGITATION, SLEEP AND ANXIETY, Disp: 60 tablet, Rfl: 3   QUEtiapine (SEROQUEL) 400 MG tablet, Take 2 tablets (800 mg total) by mouth at bedtime., Disp: 60 tablet, Rfl: 3   BD [employer] MISC, Place used needles and syringes in sharps container, Disp: 1 each, Rfl: 0   Cyanocobalamin (VITAMIN DEFICIENCY SYSTEM-B12)  1000 MCG/ML KIT, 1000 mcg daily for 1 week, then weekly for 1 month, then monthly for a year, Disp: 1 kit, Rfl: 0   SYRINGE-NEEDLE, DISP, 3 ML 22G X 1-1/2" 3 ML MISC, Use a new needle and syringe for each injection., Disp: 50 each, Rfl: 0  No Known Allergies   ROS: Review of Systems A comprehensive review of systems was negative except for: vision changes , 2 days n/v stress self resolved     Physical exam BP 140/82 (BP Location: Left Arm, Patient Position: Sitting, Cuff Size: Normal)   Pulse 77   Temp (!) 97.5 F (36.4 C) (Temporal)   Ht 5' 3"  (1.6 m)   Wt 170 lb 9.6 oz (77.4 kg)   SpO2 98%   BMI 30.22 kg/m  General appearance: alert, cooperative, appears stated age, and fatigued Head: Normocephalic, without obvious abnormality, atraumatic Eyes: conjunctivae/corneas clear. PERRL, EOM's intact. Fundi benign. Ears: normal TM's and external ear canals both ears Nose: Nares normal. Septum midline. Mucosa normal. No drainage or sinus tenderness. Neck: no adenopathy, no carotid bruit, no JVD, supple, symmetrical, trachea midline, and thyroid not enlarged, symmetric, no tenderness/mass/nodules Back: symmetric, no curvature. ROM normal. No CVA tenderness. Lungs: clear to auscultation bilaterally Heart: regular rate and rhythm, S1, S2 normal, no murmur, click, rub or gallop  Family History  Problem Relation Age of Onset   Diabetes Paternal Grandmother    Hypertension Paternal Grandmother    Diabetes Maternal Grandmother    Hypertension Maternal Grandmother    Diabetes Mother    Hypertension Mother    Diabetes Brother    Diabetes Sister    Heart disease Sister    Hypertension Sister     Social History   Socioeconomic History   Marital status: Legally Separated    Spouse name: Not on file   Number of children: Not on file   Years of education: Not on file   Highest education level: Not on file  Occupational History   Not on file  Tobacco Use   Smoking status: Every Day     Packs/day: 0.50    Years: 34.00    Pack years: 17.00    Types: Cigarettes   Smokeless tobacco: Never  Vaping Use   Vaping Use: Never used  Substance and Sexual Activity   Alcohol use: Yes    Alcohol/week: 0.0 standard drinks    Comment: occassionally   Drug use: No   Sexual activity: Not on file  Other Topics Concern   Not on file  Social History Narrative   Right handed    Lives alone    Social Determinants of Health   Financial  Resource Strain: Not on file  Food Insecurity: Not on file  Transportation Needs: Not on file  Physical Activity: Not on file  Stress: Not on file  Social Connections: Not on file  Intimate Partner Violence: Not on file   Assessment & Plan:   Diagnoses and all orders for this visit:  Tobacco abuse - I have recommended complete cessation of tobacco use. I have discussed various options available for assistance with tobacco cessation including over the counter methods (Nicotine gum, patch and lozenges). We also discussed prescription options (Chantix, Nicotine Inhaler / Nasal Spray). The patient is not interested in pursuing any prescription tobacco cessation options at this time. - Patient declines at this time.    Encounter for screening mammogram for malignant neoplasm of breast Patient completed application for BCCP while in clinic and application has and faxed to [facility]. Patient aware that [facility] will contact her directly to schedule appointment.   Colon cancer screening -     Fecal occult blood, imunochemical; Future  Annual physical exam -     Lipid Panel -     CBC with Differential -     Vitamin B12  Essential hypertension Counseled on blood pressure goal of less than 130/80, low-sodium, DASH diet, medication compliance, 150 minutes of moderate intensity exercise per week. Discussed medication compliance, adverse effects.  -     CMP14+EGFR -     amLODipine (NORVASC) 10 MG tablet; Take 1 tablet (10 mg total) by mouth  daily.  Prediabetes -     HgB A1c 6.3   Outpatient Encounter Medications as of 08/14/2021  Medication Sig   amLODipine (NORVASC) 10 MG tablet Take 1 tablet (10 mg total) by mouth daily.   benztropine (COGENTIN) 1 MG tablet Take 1 tablet (1 mg total) by mouth 2 (two) times daily.   DULoxetine (CYMBALTA) 60 MG capsule Take 1 capsule (60 mg total) by mouth 2 (two) times daily.   hydrOXYzine (VISTARIL) 50 MG capsule Take 1 capsule (50 mg total) by mouth every 4 (four) hours as needed.   Oxcarbazepine (TRILEPTAL) 300 MG tablet Take 1 tablet (300 mg total) by mouth 2 (two) times daily.   propranolol (INDERAL) 20 MG tablet TAKE 1 TABLET BY MOUTH TWICE A DAY AS NEEDED FOR PANIC, AGITATION, SLEEP AND ANXIETY   QUEtiapine (SEROQUEL) 400 MG tablet Take 2 tablets (800 mg total) by mouth at bedtime.   BD [employer] Home MISC Place used needles and syringes in sharps container   Cyanocobalamin (VITAMIN DEFICIENCY SYSTEM-B12) 1000 MCG/ML KIT 1000 mcg daily for 1 week, then weekly for 1 month, then monthly for a year   SYRINGE-NEEDLE, DISP, 3 ML 22G X 1-1/2" 3 ML MISC Use a new needle and syringe for each injection.   No facility-administered encounter medications on file as of 08/14/2021.   Follow-up: Return in about 6 weeks (around 09/25/2021) for Bp f/u.   Counseled on healthy lifestyle choices, including diet (rich in fruits, vegetables and lean meats and low in salt and simple carbohydrates) and exercise (at least 30 minutes of moderate physical activity daily).  Patient to follow up in 1 year for annual exam or sooner if needed.  The above assessment and management plan was discussed with the patient. The patient verbalized understanding of and has agreed to the management plan. Patient is aware to call the clinic if symptoms persist or worsen. Patient is aware when to return to the clinic for a follow-up visit. Patient educated on when it is  appropriate to go to the emergency department.    Juluis Mire NP-C [address] [place] [phone]

## 2021-08-14 NOTE — Patient Instructions (Addendum)
Influenza, Adult Influenza is also called "the flu." It is an infection in the lungs, nose, and throat (respiratory tract). It spreads easily from person to person (is contagious). The flu causes symptoms that are like a cold, along with high fever and body aches. What are the causes? This condition is caused by the influenza virus. You can get the virus by: Breathing in droplets that are in the air after a person infected with the flu coughed or sneezed. Touching something that has the virus on it and then touching your mouth, nose, or eyes. What increases the risk? Certain things may make you more likely to get the flu. These include: Not washing your hands often. Having close contact with many people during cold and flu season. Touching your mouth, eyes, or nose without first washing your hands. Not getting a flu shot every year. You may have a higher risk for the flu, and serious problems, such as a lung infection (pneumonia), if you: Are older than 65. Are pregnant. Have a weakened disease-fighting system (immune system) because of a disease or because you are taking certain medicines. Have a long-term (chronic) condition, such as: Heart, kidney, or lung disease. Diabetes. Asthma. Have a liver disorder. Are very overweight (morbidly obese). Have anemia. What are the signs or symptoms? Symptoms usually begin suddenly and last 4-14 days. They may include: Fever and chills. Headaches, body aches, or muscle aches. Sore throat. Cough. Runny or stuffy (congested) nose. Feeling discomfort in your chest. Not wanting to eat as much as normal. Feeling weak or tired. Feeling dizzy. Feeling sick to your stomach or throwing up. How is this treated? If the flu is found early, you can be treated with antiviral medicine. This can help to reduce how bad the illness is and how long it lasts. This may be given by mouth or through an IV tube. Taking care of yourself at home can help your  symptoms get better. Your doctor may want you to: Take over-the-counter medicines. Drink plenty of fluids. The flu often goes away on its own. If you have very bad symptoms or other problems, you may be treated in a hospital. Follow these instructions at home:   Activity Rest as needed. Get plenty of sleep. Stay home from work or school as told by your doctor. Do not leave home until you do not have a fever for 24 hours without taking medicine. Leave home only to go to your doctor. Eating and drinking Take an ORS (oral rehydration solution). This is a drink that is sold at pharmacies and stores. Drink enough fluid to keep your pee pale yellow. Drink clear fluids in small amounts as you are able. Clear fluids include: Water. Ice chips. Fruit juice mixed with water. Low-calorie sports drinks. Eat bland foods that are easy to digest. Eat small amounts as you are able. These foods include: Bananas. Applesauce. Rice. Lean meats. Toast. Crackers. Do not eat or drink: Fluids that have a lot of sugar or caffeine. Alcohol. Spicy or fatty foods. General instructions Take over-the-counter and prescription medicines only as told by your doctor. Use a cool mist humidifier to add moisture to the air in your home. This can make it easier for you to breathe. When using a cool mist humidifier, clean it daily. Empty water and replace with clean water. Cover your mouth and nose when you cough or sneeze. Wash your hands with soap and water often and for at least 20 seconds. This is also important after  you cough or sneeze. If you cannot use soap and water, use alcohol-based hand sanitizer. Keep all follow-up visits. How is this prevented?  Get a flu shot every year. You may get the flu shot in late summer, fall, or winter. Ask your doctor when you should get your flu shot. Avoid contact with people who are sick during fall and winter. This is cold and flu season. Contact a doctor if: You get  new symptoms. You have: Chest pain. Watery poop (diarrhea). A fever. Your cough gets worse. You start to have more mucus. You feel sick to your stomach. You throw up. Get help right away if you: Have shortness of breath. Have trouble breathing. Have skin or nails that turn a bluish color. Have very bad pain or stiffness in your neck. Get a sudden headache. Get sudden pain in your face or ear. Cannot eat or drink without throwing up. These symptoms may represent a serious problem that is an emergency. Get medical help right away. Call your local emergency services (911 in the [place] Do not wait to see if the symptoms will go away. Do not drive yourself to the hospital. Summary Influenza is also called "the flu." It is an infection in the lungs, nose, and throat. It spreads easily from person to person. Take over-the-counter and prescription medicines only as told by your doctor. Getting a flu shot every year is the best way to not get the flu. This information is not intended to replace advice given to you by your health care provider. Make sure you discuss any questions you have with your health care provider. Document Revised: 05/05/2020 Document Reviewed: 05/05/2020 Elsevier Patient Education  2022 [employer]. Pancreatitis Eating Plan Pancreatitis is when your pancreas becomes irritated and swollen (inflamed). The pancreas is a small organ located behind your stomach. It helps your body digest food and regulate your blood sugar. Pancreatitis can affect how your body digests food, especially foods with fat. You may also have other symptoms such as abdominal pain or nausea. When you have pancreatitis, following a low-fat eating plan may help you manage symptoms and recover more quickly. Work with your health care provider or a diet and nutrition specialist (dietitian) to create an eating plan that is right for you. What are tips for following this plan? Reading food labels Use the  information on food labels to help keep track of how much fat you eat: Check the serving size. Look for the amount of total fat in grams (g) in one serving. Low-fat foods have 3 g of fat or less per serving. Fat-free foods have 0.5 g of fat or less per serving. Keep track of how much fat you eat based on how many servings you eat. For example, if you eat two servings, the amount of fat you eat will be two times what is listed on the label. Shopping  Buy low-fat or nonfat foods, such as: Fresh, frozen, or canned fruits and vegetables. Grains, including pasta, bread, and rice. Lean meat, poultry, fish, and other protein foods. Low-fat or nonfat dairy. Avoid buying bakery products and other sweets made with whole milk, butter, and eggs. Avoid buying snack foods with added fat, such as anything with butter or cheese flavoring. Cooking Remove skin from poultry, and remove extra fat from meat. Limit the amount of fat and oil you use to 6 teaspoons or less per day. Cook using low-fat methods, such as boiling, broiling, grilling, steaming, or baking. Use spray oil to  cook. Add fat-free chicken broth to add flavor and moisture. Avoid adding cream to thicken soups or sauces. Use other thickeners such as corn starch or tomato paste. Meal planning  Eat a low-fat diet as told by your dietitian. For most people, this means having no more than 55-65 grams of fat each day. Eat small, frequent meals throughout the day. For example, you may have 5-6 small meals instead of 3 large meals. Drink enough fluid to keep your urine pale yellow. Do not drink alcohol. Talk to your health care provider if you need help stopping. Limit how much caffeine you have, including black coffee, black and green tea, caffeinated soft drinks, and energy drinks. General information Let your health care provider or dietitian know if you have unplanned weight loss on this eating plan. You may be instructed to follow a clear  liquid diet during a flare of symptoms. Talk with your health care provider about how to manage your diet during symptoms of a flare. Take any vitamins or supplements as told by your health care provider. Work with a Microbiologist, especially if you have other conditions such as obesity or diabetes mellitus. What foods should I avoid? Fruits Fried fruits. Fruits served with butter or cream. Vegetables Fried vegetables. Vegetables cooked with butter, cheese, or cream. Grains Biscuits, waffles, donuts, pastries, and croissants. Pies and cookies. Butter-flavored popcorn. Regular crackers. Meats and other protein foods Fatty cuts of meat. Poultry with skin. Organ meats. Bacon, sausage, and cold cuts. Whole eggs. Nuts and nut butters. Dairy Whole and 2% milk. Whole milk yogurt. Whole milk ice cream. Cream and half-and-half. Cream cheese. Sour cream. Cheese. Beverages Wine, beer, and liquor. The items listed above may not be a complete list of foods and beverages to avoid. Contact a dietitian for more information. Summary Pancreatitis can affect how your body digests food, especially foods with fat. When you have pancreatitis, it is recommended that you follow a low-fat eating plan to help you recover more quickly and manage symptoms. For most people, this means limiting fat to no more than 55-65 grams per day. Do not drink alcohol. Limit the amount of caffeine you have, and drink enough fluid to keep your urine pale yellow. This information is not intended to replace advice given to you by your health care provider. Make sure you discuss any questions you have with your health care provider. Document Revised: 01/07/2019 Document Reviewed: 12/23/2017 Elsevier Patient Education  [place].

## 2021-08-15 ENCOUNTER — Other Ambulatory Visit (INDEPENDENT_AMBULATORY_CARE_PROVIDER_SITE_OTHER): Payer: Self-pay | Admitting: Primary Care

## 2021-08-15 LAB — CBC WITH DIFFERENTIAL/PLATELET
Basophils Absolute: 0.1 10*3/uL (ref 0.0–0.2)
Basos: 1 %
EOS (ABSOLUTE): 0.2 10*3/uL (ref 0.0–0.4)
Eos: 2 %
Hematocrit: 43.4 % (ref 34.0–46.6)
Hemoglobin: 14.3 g/dL (ref 11.1–15.9)
Immature Grans (Abs): 0 10*3/uL (ref 0.0–0.1)
Immature Granulocytes: 0 %
Lymphocytes Absolute: 2.1 10*3/uL (ref 0.7–3.1)
Lymphs: 27 %
MCH: 26.8 pg (ref 26.6–33.0)
MCHC: 32.9 g/dL (ref 31.5–35.7)
MCV: 81 fL (ref 79–97)
Monocytes Absolute: 0.7 10*3/uL (ref 0.1–0.9)
Monocytes: 9 %
Neutrophils Absolute: 4.7 10*3/uL (ref 1.4–7.0)
Neutrophils: 61 %
Platelets: 320 10*3/uL (ref 150–450)
RBC: 5.34 x10E6/uL — ABNORMAL HIGH (ref 3.77–5.28)
RDW: 15.8 % — ABNORMAL HIGH (ref 11.7–15.4)
WBC: 7.7 10*3/uL (ref 3.4–10.8)

## 2021-08-15 LAB — CMP14+EGFR
ALT: 15 IU/L (ref 0–32)
AST: 17 IU/L (ref 0–40)
Albumin/Globulin Ratio: 1.5 (ref 1.2–2.2)
Albumin: 4.6 g/dL (ref 3.8–4.9)
Alkaline Phosphatase: 75 IU/L (ref 44–121)
BUN/Creatinine Ratio: 12 (ref 12–28)
BUN: 12 mg/dL (ref 8–27)
Bilirubin Total: 0.3 mg/dL (ref 0.0–1.2)
CO2: 25 mmol/L (ref 20–29)
Calcium: 10 mg/dL (ref 8.7–10.3)
Chloride: 104 mmol/L (ref 96–106)
Creatinine, Ser: 0.98 mg/dL (ref 0.57–1.00)
Globulin, Total: 3 g/dL (ref 1.5–4.5)
Glucose: 101 mg/dL — ABNORMAL HIGH (ref 70–99)
Potassium: 5 mmol/L (ref 3.5–5.2)
Sodium: 143 mmol/L (ref 134–144)
Total Protein: 7.6 g/dL (ref 6.0–8.5)
eGFR: 66 mL/min/{1.73_m2} (ref >59)

## 2021-08-15 LAB — LIPID PANEL
Chol/HDL Ratio: 4.9 ratio — ABNORMAL HIGH (ref 0.0–4.4)
Cholesterol, Total: 273 mg/dL — ABNORMAL HIGH (ref 100–199)
HDL: 56 mg/dL (ref >39)
LDL Chol Calc (NIH): 165 mg/dL — ABNORMAL HIGH (ref 0–99)
Triglycerides: 278 mg/dL — ABNORMAL HIGH (ref 0–149)
VLDL Cholesterol Cal: 52 mg/dL — ABNORMAL HIGH (ref 5–40)

## 2021-08-15 LAB — VITAMIN B12: Vitamin B-12: 371 pg/mL (ref 232–1245)

## 2021-08-15 MED ORDER — ROSUVASTATIN CALCIUM 40 MG PO TABS
40.0000 mg | ORAL_TABLET | Freq: Every day | ORAL | 3 refills | Status: DC
  Filled 2021-08-15 – 2021-10-24 (×3): qty 30, 30d supply, fill #0
  Filled 2021-11-23: qty 30, 30d supply, fill #1

## 2021-08-16 ENCOUNTER — Telehealth (INDEPENDENT_AMBULATORY_CARE_PROVIDER_SITE_OTHER): Payer: Self-pay

## 2021-08-16 ENCOUNTER — Other Ambulatory Visit: Payer: Self-pay

## 2021-08-16 NOTE — Telephone Encounter (Signed)
-----   Message from Kerin Perna, NP sent at 08/15/2021  9:50 PM EST ----- Your cholesterol is high, Increase risk of heart attack and/or stroke.  To reduce your Cholesterol , Remember - more fruits and vegetables, more fish, and limit red meat and dairy products. More soy, nuts, beans, barley, lentils, oats and plant sterol ester enriched margarine instead of butter. I also encourage eliminating sugar and processed food. New script sent for rosuvastatin 40mg  at bedtime

## 2021-08-16 NOTE — Telephone Encounter (Signed)
Patient verified date of birth. She is aware of high cholesterol which increases risk of stroke and heart attack. Informed that medication to help lower cholesterol has been sent to pharmacy; advised to take at bedtime. Provided dietary advise to patient. She verbalized understanding. Nat Christen, CMA

## 2021-08-28 ENCOUNTER — Other Ambulatory Visit: Payer: Self-pay

## 2021-09-13 ENCOUNTER — Other Ambulatory Visit: Payer: Self-pay

## 2021-09-14 ENCOUNTER — Other Ambulatory Visit: Payer: Self-pay

## 2021-09-25 ENCOUNTER — Ambulatory Visit (INDEPENDENT_AMBULATORY_CARE_PROVIDER_SITE_OTHER): Payer: Medicaid Other | Admitting: Primary Care

## 2021-10-09 IMAGING — CT CT HEAD W/O CM
3 series · 16 of 47 positions shown, 19 images · non-contrast
Comparison: CT brain 02/12/2008

CLINICAL DATA: Mental status change

EXAM:
CT HEAD WITHOUT CONTRAST
TECHNIQUE: Contiguous axial images were obtained from the base of the skull
through the vertex without intravenous contrast.

[Series 2: head wo · axial · 0.45mm/px · z∈[-128,-3]mm · 10 of 30 slices shown, 13 images]
[im 3/30  brain]
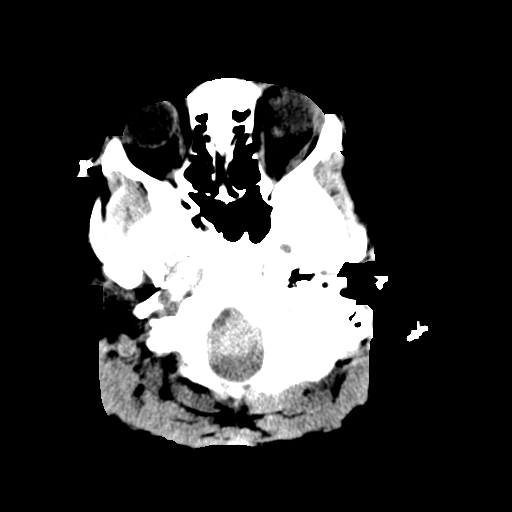
[im 3/30  bone]
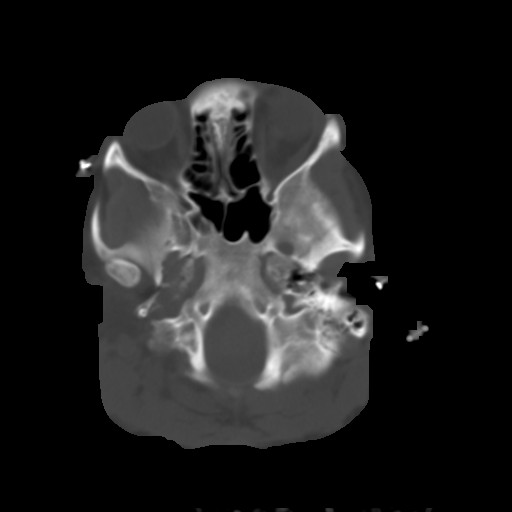
[im 6/30  brain]
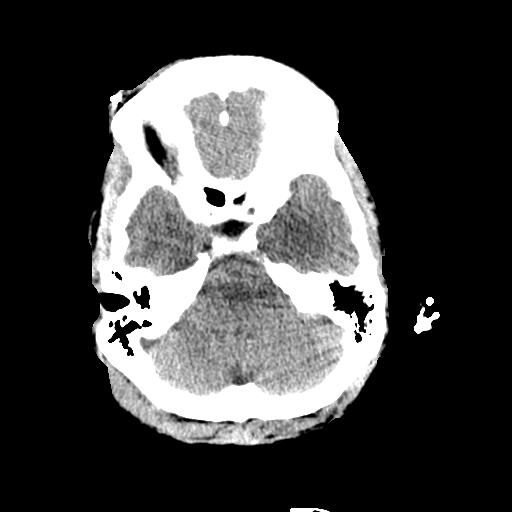
[im 9/30  brain]
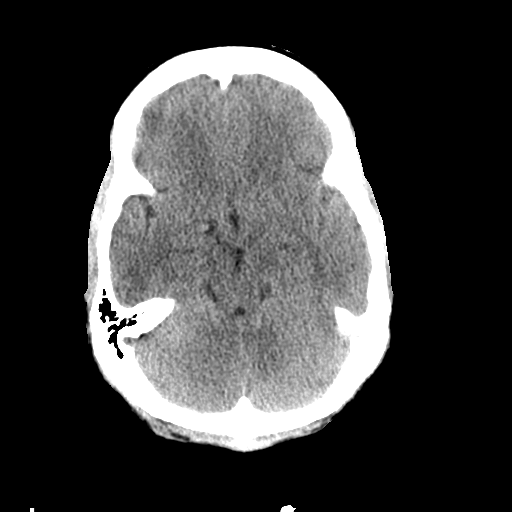
[im 11/30  brain]
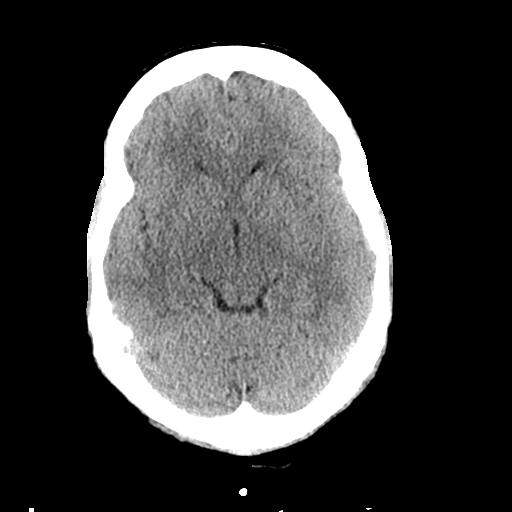
[im 14/30  brain]
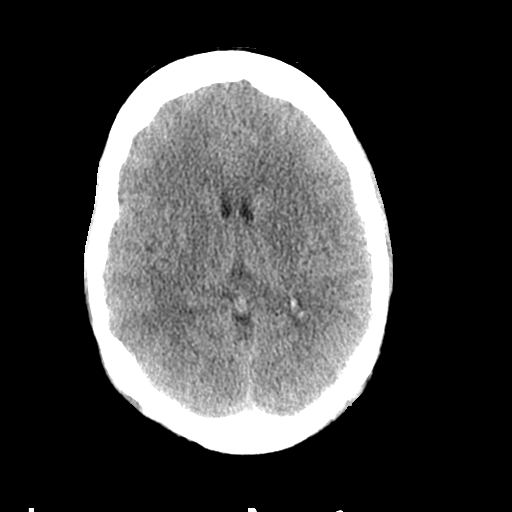
[im 14/30  bone]
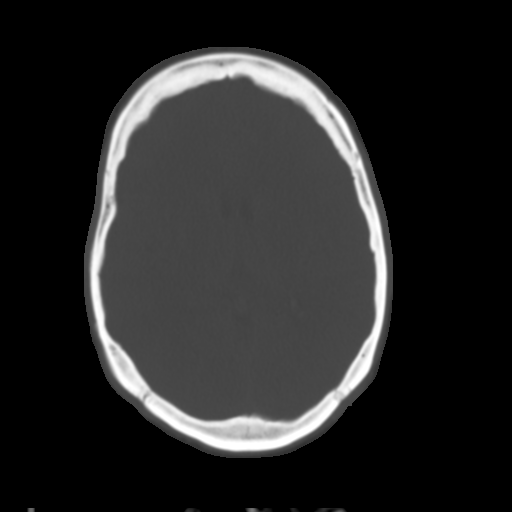
[im 17/30  brain]
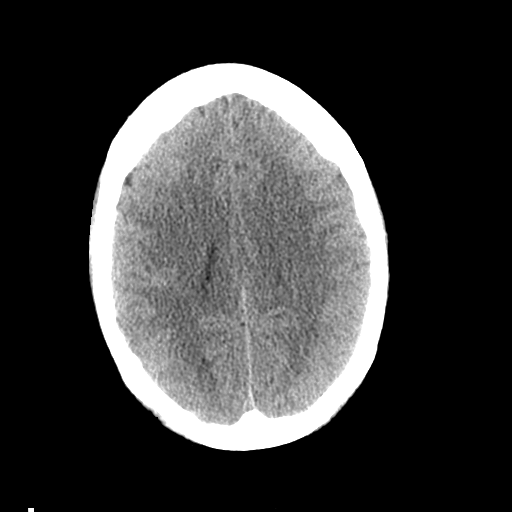
[im 20/30  brain]
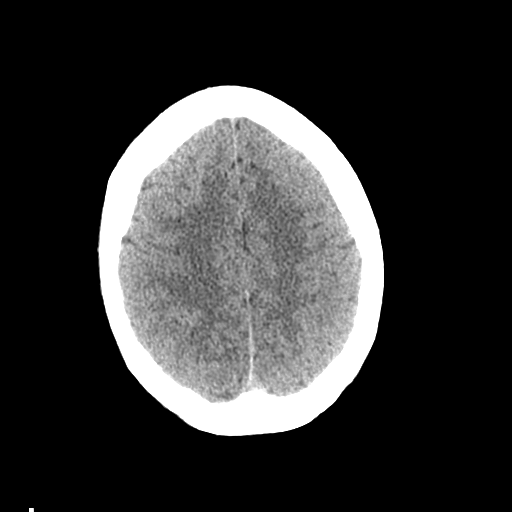
[im 23/30  brain]
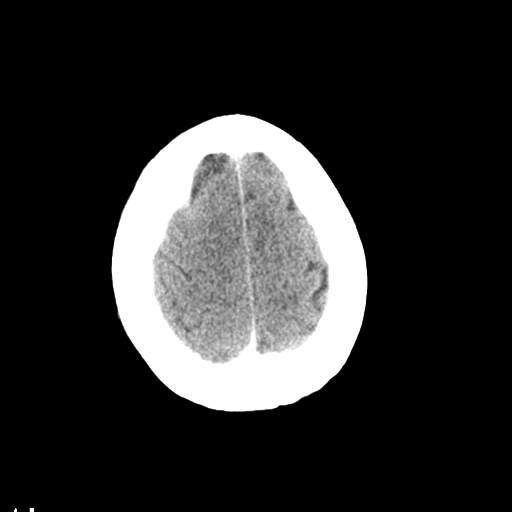
[im 25/30  brain]
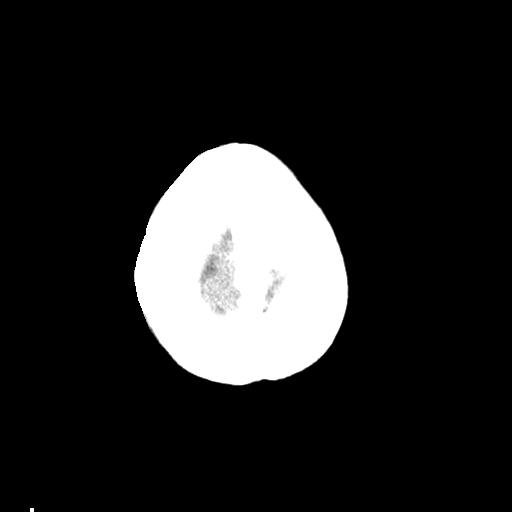
[im 25/30  bone]
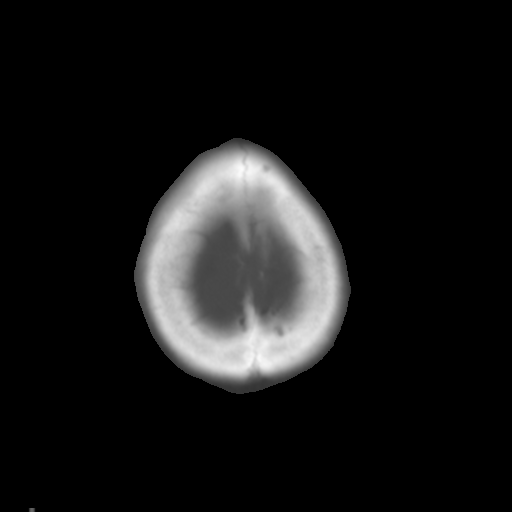
[im 28/30  brain]
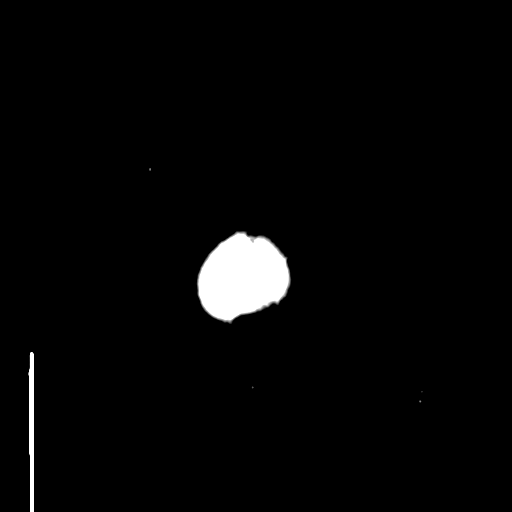

[Series 5: coronal soft tissue · coronal · 0.32mm/px · 3 of 70 slices shown]
[im 24/70  brain]
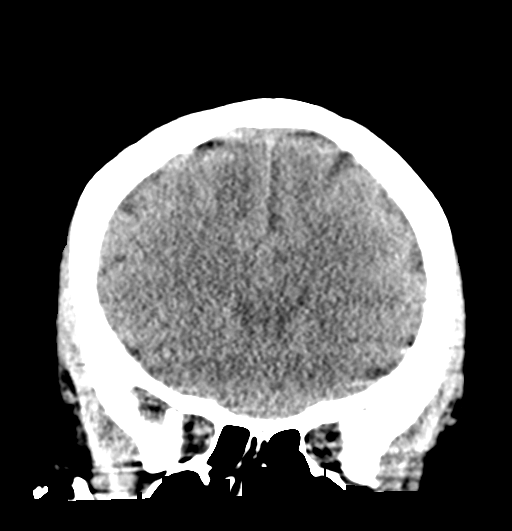
[im 31/70  brain]
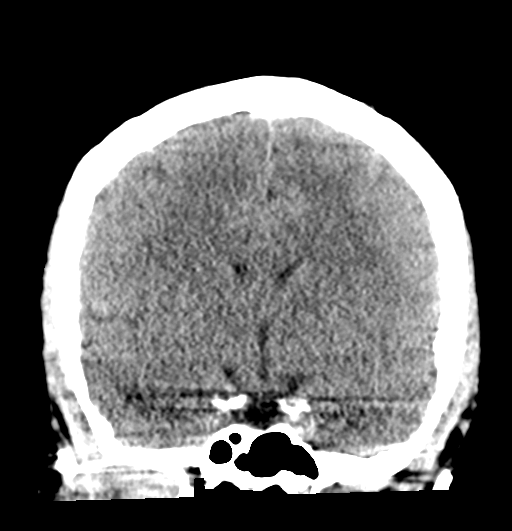
[im 39/70  brain]
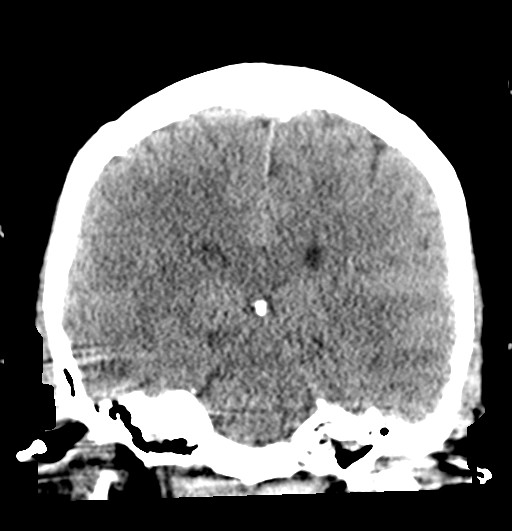

[Series 6: sagittal soft tissue · sagittal · 0.32mm/px · 3 of 54 slices shown]
[im 18/54  brain]
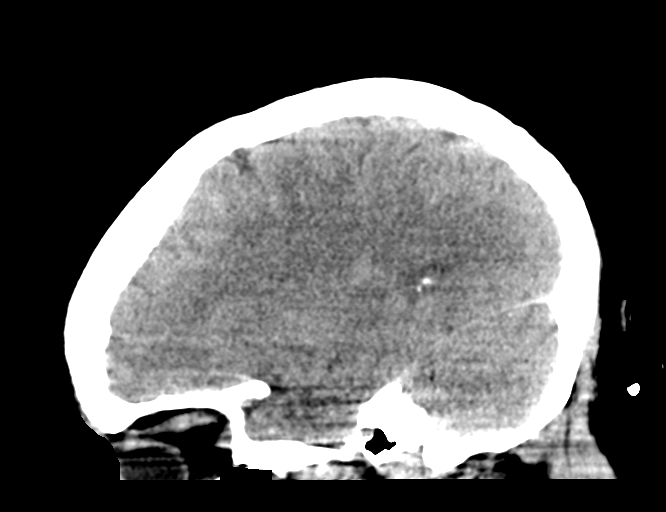
[im 27/54  brain]
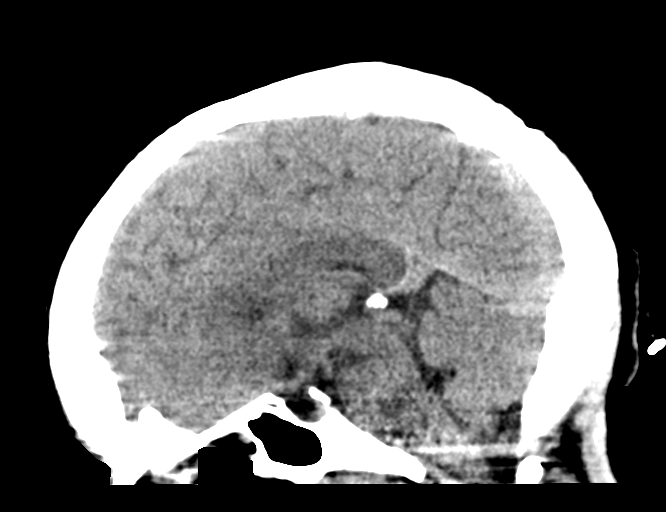
[im 36/54  brain]
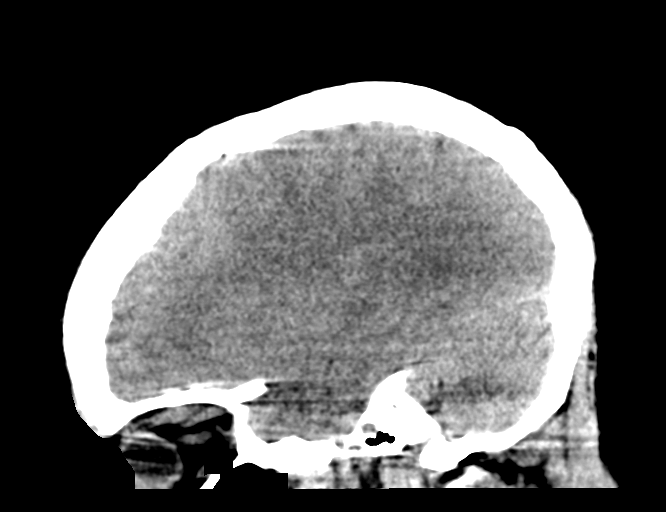

[16 of 47 positions shown; findings below may reference images not displayed]

FINDINGS: Brain: No acute territorial infarction, hemorrhage or intracranial
mass. The ventricles are nonenlarged.

Vascular: No hyperdense vessels.  No unexpected calcification

Skull: Normal. Negative for fracture or focal lesion.

Sinuses/Orbits: No acute finding.

Other: None
IMPRESSION: Negative non contrasted CT appearance of the brain.

## 2021-10-22 ENCOUNTER — Other Ambulatory Visit: Payer: Self-pay

## 2021-10-22 ENCOUNTER — Encounter (HOSPITAL_COMMUNITY): Payer: No Payment, Other | Admitting: Psychiatry

## 2021-10-22 ENCOUNTER — Encounter (HOSPITAL_COMMUNITY): Payer: Self-pay

## 2021-10-24 ENCOUNTER — Other Ambulatory Visit: Payer: Self-pay

## 2021-11-12 ENCOUNTER — Encounter (HOSPITAL_COMMUNITY): Payer: No Payment, Other | Admitting: Psychiatry

## 2021-11-13 ENCOUNTER — Other Ambulatory Visit: Payer: Self-pay

## 2021-11-13 ENCOUNTER — Encounter (HOSPITAL_COMMUNITY): Payer: Self-pay

## 2021-11-13 ENCOUNTER — Telehealth (HOSPITAL_COMMUNITY): Payer: No Payment, Other | Admitting: Psychiatry

## 2021-11-13 ENCOUNTER — Telehealth (HOSPITAL_COMMUNITY): Payer: Self-pay | Admitting: Psychiatry

## 2021-11-13 ENCOUNTER — Other Ambulatory Visit (HOSPITAL_COMMUNITY): Payer: Self-pay | Admitting: Psychiatry

## 2021-11-13 DIAGNOSIS — F411 Generalized anxiety disorder: Secondary | ICD-10-CM

## 2021-11-13 DIAGNOSIS — F319 Bipolar disorder, unspecified: Secondary | ICD-10-CM

## 2021-11-13 MED ORDER — QUETIAPINE FUMARATE 400 MG PO TABS
800.0000 mg | ORAL_TABLET | Freq: Every day | ORAL | 3 refills | Status: DC
  Filled 2021-11-13 – 2021-11-23 (×2): qty 60, 30d supply, fill #0
  Filled 2021-12-28: qty 60, 30d supply, fill #1
  Filled 2022-01-28: qty 60, 30d supply, fill #2
  Filled 2022-03-04: qty 60, 30d supply, fill #3

## 2021-11-13 MED ORDER — HYDROXYZINE PAMOATE 50 MG PO CAPS
50.0000 mg | ORAL_CAPSULE | ORAL | 3 refills | Status: DC | PRN
Start: 2021-11-13 — End: 2022-04-19
  Filled 2021-11-13 – 2021-11-23 (×2): qty 120, 20d supply, fill #0
  Filled 2022-01-28: qty 120, 20d supply, fill #1
  Filled 2022-03-04: qty 120, 20d supply, fill #2
  Filled 2022-04-05: qty 120, 20d supply, fill #3

## 2021-11-13 MED ORDER — DULOXETINE HCL 60 MG PO CPEP
60.0000 mg | ORAL_CAPSULE | Freq: Two times a day (BID) | ORAL | 3 refills | Status: DC
  Filled 2021-11-13 – 2021-11-23 (×2): qty 60, 30d supply, fill #0
  Filled 2022-01-28: qty 60, 30d supply, fill #1
  Filled 2022-03-04: qty 60, 30d supply, fill #2
  Filled 2022-04-05: qty 60, 30d supply, fill #3

## 2021-11-13 MED ORDER — BENZTROPINE MESYLATE 1 MG PO TABS
1.0000 mg | ORAL_TABLET | Freq: Two times a day (BID) | ORAL | 3 refills | Status: DC
  Filled 2021-11-13 – 2021-11-23 (×2): qty 60, 30d supply, fill #0
  Filled 2022-01-28: qty 60, 30d supply, fill #1
  Filled 2022-03-04: qty 60, 30d supply, fill #2
  Filled 2022-04-05: qty 60, 30d supply, fill #3

## 2021-11-13 MED ORDER — OXCARBAZEPINE 300 MG PO TABS
300.0000 mg | ORAL_TABLET | Freq: Two times a day (BID) | ORAL | 3 refills | Status: DC
  Filled 2021-11-13 – 2021-11-23 (×2): qty 60, 30d supply, fill #0
  Filled 2022-01-28: qty 60, 30d supply, fill #1
  Filled 2022-03-04: qty 60, 30d supply, fill #2
  Filled 2022-04-05: qty 60, 30d supply, fill #3

## 2021-11-13 MED ORDER — PROPRANOLOL HCL 20 MG PO TABS
ORAL_TABLET | ORAL | 3 refills | Status: DC
  Filled 2021-11-13 – 2021-11-23 (×2): qty 60, 30d supply, fill #0
  Filled 2022-03-04: qty 60, 30d supply, fill #1
  Filled 2022-04-05: qty 60, 30d supply, fill #2
  Filled 2022-05-03: qty 60, 30d supply, fill #3

## 2021-11-13 NOTE — Telephone Encounter (Signed)
Appt confirmed yesterday for today, however, pt left a message late afternoon yesterday 11/12/21 to cancel appt 11/13/21.  Upon return call this morning, her phone could not complete the call.     Pt unable to complete visit 11/13/21. Please refill prescriptions at preferred pharmacy on file.

## 2021-11-13 NOTE — Telephone Encounter (Signed)
Medications refilled and sent to preferred pharmacy.

## 2021-11-20 ENCOUNTER — Other Ambulatory Visit: Payer: Self-pay

## 2021-11-23 ENCOUNTER — Other Ambulatory Visit: Payer: Self-pay

## 2021-11-26 ENCOUNTER — Other Ambulatory Visit: Payer: Self-pay

## 2021-12-14 ENCOUNTER — Ambulatory Visit (INDEPENDENT_AMBULATORY_CARE_PROVIDER_SITE_OTHER): Payer: Self-pay

## 2021-12-14 NOTE — Telephone Encounter (Signed)
?  Chief Complaint: abdominal pain ?Symptoms: abdominal pain in middle and RLQ, N/V ?Frequency: 3 months ?Pertinent Negatives: Patient denies diarrhea or constipation ?Disposition: '[]'$ ED /'[]'$ Urgent Care (no appt availability in office) / '[x]'$ Appointment(In office/virtual)/ '[]'$  [place] [facility]/ '[]'$ Home Care/ '[]'$ Refused Recommended Disposition /'[]'$ [place] [facility]/ '[]'$  Follow-up with PCP ?Additional Notes: pt states that she has vomited x 2 in 24 hrs and this pain has been going on for a while now. Pt advised 1st appt was 01/07/22. Called and spoke with [facility] who states appt was correct. No other openings. Let pt know that information and advised UC and pt wanted appt scheduled. Encouraged pt if symptoms got worse to go to UC instead of waiting for appt  ? ? ?Reason for Disposition ? [1] MODERATE pain (e.g., interferes with normal activities) AND [2] pain comes and goes (cramps) AND [3] present > 24 hours  (Exception: pain with Vomiting or Diarrhea - see that Guideline) ? ?Answer Assessment - Initial Assessment Questions ?1. LOCATION: "Where does it hurt?"  ?    Lower abdomen and R ovaries ?3. ONSET: "When did the pain begin?" (e.g., minutes, hours or days ago)  ?    3 months ?5. PATTERN "Does the pain come and go, or is it constant?" ?   - If constant: "Is it getting better, staying the same, or worsening?"  ?    (Note: Constant means the pain never goes away completely; most serious pain is constant and it progresses)  ?   - If intermittent: "How long does it last?" "Do you have pain now?" ?    (Note: Intermittent means the pain goes away completely between bouts) ?    Comes and goes ?6. SEVERITY: "How bad is the pain?"  (e.g., Scale 1-10; mild, moderate, or severe) ?  - MILD (1-3): doesn't interfere with normal activities, abdomen soft and not tender to touch  ?  - MODERATE (4-7): interferes with normal activities or awakens from sleep, abdomen tender to touch  ?  - SEVERE (8-10): excruciating pain, doubled over,  unable to do any normal activities  ?    8 ?9. RELIEVING/AGGRAVATING FACTORS: "What makes it better or worse?" (e.g., movement, antacids, bowel movement) ?    Nothing makes better or worse  ?10. OTHER SYMPTOMS: "Do you have any other symptoms?" (e.g., back pain, diarrhea, fever, urination pain, vomiting) ?      Nausea and vomiting, 2 in past 24 hours ? ?Protocols used: Abdominal Pain - Female-A-AH ? ?

## 2021-12-28 ENCOUNTER — Other Ambulatory Visit: Payer: Self-pay

## 2022-01-01 ENCOUNTER — Encounter (HOSPITAL_COMMUNITY): Payer: Self-pay

## 2022-01-01 ENCOUNTER — Telehealth (HOSPITAL_COMMUNITY): Payer: No Payment, Other | Admitting: Psychiatry

## 2022-01-07 ENCOUNTER — Ambulatory Visit (INDEPENDENT_AMBULATORY_CARE_PROVIDER_SITE_OTHER): Payer: Medicaid Other | Admitting: Primary Care

## 2022-01-10 ENCOUNTER — Encounter (INDEPENDENT_AMBULATORY_CARE_PROVIDER_SITE_OTHER): Payer: Self-pay | Admitting: Primary Care

## 2022-01-10 ENCOUNTER — Other Ambulatory Visit: Payer: Self-pay

## 2022-01-10 ENCOUNTER — Ambulatory Visit (INDEPENDENT_AMBULATORY_CARE_PROVIDER_SITE_OTHER): Payer: Self-pay | Admitting: Primary Care

## 2022-01-10 VITALS — BP 134/77 | HR 98 | Temp 98.1°F | Ht 63.0 in | Wt 167.4 lb

## 2022-01-10 DIAGNOSIS — Z1211 Encounter for screening for malignant neoplasm of colon: Secondary | ICD-10-CM

## 2022-01-10 DIAGNOSIS — K852 Alcohol induced acute pancreatitis without necrosis or infection: Secondary | ICD-10-CM

## 2022-01-10 DIAGNOSIS — I10 Essential (primary) hypertension: Secondary | ICD-10-CM

## 2022-01-10 DIAGNOSIS — E782 Mixed hyperlipidemia: Secondary | ICD-10-CM

## 2022-01-10 DIAGNOSIS — Z720 Tobacco use: Secondary | ICD-10-CM

## 2022-01-10 MED ORDER — AMLODIPINE BESYLATE 10 MG PO TABS
10.0000 mg | ORAL_TABLET | Freq: Every day | ORAL | 1 refills | Status: DC
  Filled 2022-01-10: qty 90, 90d supply, fill #0
  Filled 2022-01-28: qty 30, 30d supply, fill #0
  Filled 2022-03-04: qty 30, 30d supply, fill #1
  Filled 2022-04-05: qty 30, 30d supply, fill #2

## 2022-01-10 NOTE — Progress Notes (Signed)
? ? ? [place] ? ? ? ? ? ? ? ?Subjective:  ?  ? Angel Hughes is a 61 y.o. female who presents for evaluation of abdominal pain. Onset was 6 months ago. Symptoms have been gradually worsening. The pain is described as aching, cramping, pressure-like, and shooting, and is 8/10 in intensity. Pain is located in the  generalized  without radiation.  Aggravating factors: alcohol and eating.  Alleviating factors: none. Associated symptoms: none. The patient denies anorexia, belching, constipation, dysuria, fever, and headache. Patient shared since her last visit she received a DUI separate event was cited for driving without a licence. Discussed causative factors of abdominal pain -alcohol- can lead to liver damage, nicotine can induce pain. Bp is unremarkable-Denies shortness of breath, headaches, chest pain or lower extremity edema   ? ?The patient's history has been marked as reviewed and updated as appropriate. ?allergies, current medications, past family history, past medical history, past social history, and past surgical history ?Past Medical History:  ?Diagnosis Date  ? Allergic rhinitis   ? Anemia   ? Anxiety   ? Arthritis   ? Bipolar disorder ([place])   ? Depression   ? GERD (gastroesophageal reflux disease)   ? Hyperlipemia   ? Hypertension   ? pt states that she has no hx of elevated BP, on med for hot flashes  ? Obesity   ? ?Patient Active Problem List  ? Diagnosis Date Noted  ? Generalized anxiety disorder 06/07/2020  ? Anxiety   ? Hypertension   ? Arthritis   ? Depression   ? Bipolar disorder with depression ([place])   ? ?Past Surgical History:  ?Procedure Laterality Date  ? ABDOMINAL HYSTERECTOMY    ? CESAREAN SECTION    ? WRIST SURGERY    ? cyst  ? Z-PLASTY REPAIR    ? ?Family History  ?Problem Relation Age of Onset  ? Diabetes Paternal Grandmother   ? Hypertension Paternal Grandmother   ? Diabetes Maternal Grandmother   ? Hypertension Maternal Grandmother   ? Diabetes Mother   ?  Hypertension Mother   ? Diabetes Brother   ? Diabetes Sister   ? Heart disease Sister   ? Hypertension Sister   ? ?Social History  ? ?Socioeconomic History  ? Marital status: Legally Separated  ?  Spouse name: Not on file  ? Number of children: Not on file  ? Years of education: Not on file  ? Highest education level: Not on file  ?Occupational History  ? Not on file  ?Tobacco Use  ? Smoking status: Every Day  ?  Packs/day: 0.50  ?  Years: 34.00  ?  Pack years: 17.00  ?  Types: Cigarettes  ? Smokeless tobacco: Never  ?Vaping Use  ? Vaping Use: Never used  ?Substance and Sexual Activity  ? Alcohol use: Yes  ?  Alcohol/week: 0.0 standard drinks  ?  Comment: occassionally  ? Drug use: No  ? Sexual activity: Not on file  ?Other Topics Concern  ? Not on file  ?Social History Narrative  ? Right handed   ? Lives alone   ? ?Social Determinants of Health  ? ?Financial Resource Strain: Not on file  ?Food Insecurity: Not on file  ?Transportation Needs: Not on file  ?Physical Activity: Not on file  ?Stress: Not on file  ?Social Connections: Not on file  ? ?Current Outpatient Medications  ?Medication Sig Dispense Refill  ? BD Radiation protection practitioner Home MISC Place used needles and  syringes in sharps container 1 each 0  ? benztropine (COGENTIN) 1 MG tablet Take 1 tablet (1 mg total) by mouth 2 (two) times daily. 60 tablet 3  ? Cyanocobalamin (VITAMIN DEFICIENCY SYSTEM-B12) 1000 MCG/ML KIT 1000 mcg daily for 1 week, then weekly for 1 month, then monthly for a year 1 kit 0  ? DULoxetine (CYMBALTA) 60 MG capsule Take 1 capsule (60 mg total) by mouth 2 (two) times daily. 60 capsule 3  ? hydrOXYzine (VISTARIL) 50 MG capsule Take 1 capsule (50 mg total) by mouth every 4 (four) hours as needed. 120 capsule 3  ? Oxcarbazepine (TRILEPTAL) 300 MG tablet Take 1 tablet (300 mg total) by mouth 2 (two) times daily. 60 tablet 3  ? propranolol (INDERAL) 20 MG tablet TAKE 1 TABLET BY MOUTH TWICE A DAY AS NEEDED FOR PANIC, AGITATION, SLEEP AND ANXIETY  60 tablet 3  ? QUEtiapine (SEROQUEL) 400 MG tablet Take 2 tablets (800 mg total) by mouth at bedtime. 60 tablet 3  ? rosuvastatin (CRESTOR) 40 MG tablet Take 1 tablet (40 mg total) by mouth daily. 90 tablet 3  ? SYRINGE-NEEDLE, DISP, 3 ML 22G X 1-1/2" 3 ML MISC Use a new needle and syringe for each injection. 50 each 0  ? amLODipine (NORVASC) 10 MG tablet Take 1 tablet (10 mg total) by mouth daily. 90 tablet 1  ? ?No current facility-administered medications for this visit.  ? ?Allergies: Patient has no known allergies. ? ?Review of Systems ?Pertinent items noted in HPI and remainder of comprehensive ROS otherwise negative.   ?  ?Objective:  ?BP 134/77 (BP Location: Right Arm, Patient Position: Sitting, Cuff Size: Normal)   Pulse 98   Temp 98.1 ?F (36.7 ?C) (Oral)   Ht 5' 3" (1.6 m)   Wt 167 lb 6.4 oz (75.9 kg)   SpO2 97%   BMI 29.65 kg/m?   ?  BP 134/77 (BP Location: Right Arm, Patient Position: Sitting, Cuff Size: Normal)   Pulse 98   Temp 98.1 ?F (36.7 ?C) (Oral)   Ht 5' 3" (1.6 m)   Wt 167 lb 6.4 oz (75.9 kg)   SpO2 97%   BMI 29.65 kg/m?  ?General appearance: alert, cooperative, and fatigued ?Head: Normocephalic, without obvious abnormality, atraumatic ?Eyes: conjunctivae/corneas clear. PERRL, EOM's intact. Fundi benign. ?Nose: Nares normal. Septum midline. Mucosa normal. No drainage or sinus tenderness. ?Neck: no adenopathy, no carotid bruit, no JVD, supple, symmetrical, trachea midline, and thyroid not enlarged, symmetric, no tenderness/mass/nodules ?Lungs: clear to auscultation bilaterally ?Heart: regular rate and rhythm, S1, S2 normal, no murmur, click, rub or gallop ?Abdomen: soft, non-tender; bowel sounds normal; no masses,  no organomegaly ?Extremities: extremities normal, atraumatic, no cyanosis or edema ?Lymph nodes: Cervical, supraclavicular, and axillary nodes normal. ?  ?Assessment:  ?[name] was seen today for abdominal pain. ? ?Diagnoses and all orders for this visit: ? ?Alcohol-induced  acute pancreatitis without infection or necrosis/ ? Abdominal pain, likely secondary to alcohol abuse .  ?-     Lipid Panel ?-     CMP14+EGFR ?-     Folate ? ?Essential hypertension ?We have discussed target BP range and blood pressure goal. I have advised patient to check BP regularly and to call us back or report to clinic if the numbers are consistently higher than 140/90. We discussed the importance of compliance with medical therapy and DASH diet recommended, consequences of uncontrolled hypertension discussed.  ?- continue current BP medications  ?-     amLODipine (NORVASC)  10 MG tablet; Take 1 tablet (10 mg total) by mouth daily. ? ?Tobacco abuse ?- I have recommended complete cessation of tobacco use. I have discussed various options available for assistance with tobacco cessation including over the counter methods (Nicotine gum, patch and lozenges). We also discussed prescription options (Chantix, Nicotine Inhaler / Nasal Spray). The patient is not interested in pursuing any prescription tobacco cessation options at this time. ?- Patient declines at this time.  ?- Less than 5 minutes spent on counseling.  ? ?Mixed hyperlipidemia ? Healthy lifestyle diet of fruits vegetables fish nuts whole grains and low saturated fat . Foods high in cholesterol or liver, fatty meats,cheese, butter avocados, nuts and seeds, chocolate and fried foods. ? ?-     Lipid Panel ?This note has been created with Surveyor, quantity. Any transcriptional errors are unintentional.  ? ?Kerin Perna, NP ?01/10/2022, 8:36 AM  ?

## 2022-01-10 NOTE — Patient Instructions (Signed)
Pancreatitis Eating Plan ?Pancreatitis is when your pancreas becomes irritated and swollen (inflamed). The pancreas is a small organ located behind your stomach. It helps your body digest food and regulate your blood sugar. Pancreatitis can affect how your body digests food, especially foods with fat. You may also have other symptoms such as abdominal pain or nausea. ?When you have pancreatitis, following a low-fat eating plan may help you manage symptoms and recover more quickly. Work with your health care provider or a diet and nutrition specialist (dietitian) to create an eating plan that is right for you. ?What are tips for following this plan? ?Reading food labels ?Use the information on food labels to help keep track of how much fat you eat: ?Check the serving size. ?Look for the amount of total fat in grams (g) in one serving. ?Low-fat foods have 3 g of fat or less per serving. ?Fat-free foods have 0.5 g of fat or less per serving. ?Keep track of how much fat you eat based on how many servings you eat. ?For example, if you eat two servings, the amount of fat you eat will be two times what is listed on the label. ?Shopping ? ?Buy low-fat or nonfat foods, such as: ?Fresh, frozen, or canned fruits and vegetables. ?Grains, including pasta, bread, and rice. ?Lean meat, poultry, fish, and other protein foods. ?Low-fat or nonfat dairy. ?Avoid buying bakery products and other sweets made with whole milk, butter, and eggs. ?Avoid buying snack foods with added fat, such as anything with butter or cheese flavoring. ?Cooking ?Remove skin from poultry, and remove extra fat from meat. ?Limit the amount of fat and oil you use to 6 teaspoons or less per day. ?Cook using low-fat methods, such as boiling, broiling, grilling, steaming, or baking. ?Use spray oil to cook. Add fat-free chicken broth to add flavor and moisture. ?Avoid adding cream to thicken soups or sauces. Use other thickeners such as corn starch or tomato  paste. ?Meal planning ? ?Eat a low-fat diet as told by your dietitian. For most people, this means having no more than 55-65 grams of fat each day. ?Eat small, frequent meals throughout the day. For example, you may have 5-6 small meals instead of 3 large meals. ?Drink enough fluid to keep your urine pale yellow. ?Do not drink alcohol. Talk to your health care provider if you need help stopping. ?Limit how much caffeine you have, including black coffee, black and green tea, caffeinated soft drinks, and energy drinks. ?General information ?Let your health care provider or dietitian know if you have unplanned weight loss on this eating plan. ?You may be instructed to follow a clear liquid diet during a flare of symptoms. Talk with your health care provider about how to manage your diet during symptoms of a flare. ?Take any vitamins or supplements as told by your health care provider. ?Work with a Microbiologist, especially if you have other conditions such as obesity or diabetes mellitus. ?What foods should I avoid? ?Fruits ?Fried fruits. Fruits served with butter or cream. ?Vegetables ?Fried vegetables. Vegetables cooked with butter, cheese, or cream. ?Grains ?Biscuits, waffles, donuts, pastries, and croissants. Pies and cookies. Butter-flavored popcorn. Regular crackers. ?Meats and other protein foods ?Fatty cuts of meat. Poultry with skin. Organ meats. Bacon, sausage, and cold cuts. Whole eggs. Nuts and nut butters. ?Dairy ?Whole and 2% milk. Whole milk yogurt. Whole milk ice cream. Cream and half-and-half. Cream cheese. Sour cream. Cheese. ?Beverages ?Wine, beer, and liquor. ?The items listed above may  not be a complete list of foods and beverages to avoid. Contact a dietitian for more information. ?Summary ?Pancreatitis can affect how your body digests food, especially foods with fat. ?When you have pancreatitis, it is recommended that you follow a low-fat eating plan to help you recover more quickly and manage  symptoms. For most people, this means limiting fat to no more than 55-65 grams per day. ?Do not drink alcohol. Limit the amount of caffeine you have, and drink enough fluid to keep your urine pale yellow. ?This information is not intended to replace advice given to you by your health care provider. Make sure you discuss any questions you have with your health care provider. ?Document Revised: 01/07/2019 Document Reviewed: 12/23/2017 ?Elsevier Patient Education ? [place]. ? ?

## 2022-01-11 LAB — CMP14+EGFR
ALT: 19 IU/L (ref 0–32)
AST: 21 IU/L (ref 0–40)
Albumin/Globulin Ratio: 1.9 (ref 1.2–2.2)
Albumin: 5.1 g/dL — ABNORMAL HIGH (ref 3.8–4.8)
Alkaline Phosphatase: 87 IU/L (ref 44–121)
BUN/Creatinine Ratio: 10 — ABNORMAL LOW (ref 12–28)
BUN: 9 mg/dL (ref 8–27)
Bilirubin Total: 0.4 mg/dL (ref 0.0–1.2)
CO2: 23 mmol/L (ref 20–29)
Calcium: 10.1 mg/dL (ref 8.7–10.3)
Chloride: 105 mmol/L (ref 96–106)
Creatinine, Ser: 0.9 mg/dL (ref 0.57–1.00)
Globulin, Total: 2.7 g/dL (ref 1.5–4.5)
Glucose: 108 mg/dL — ABNORMAL HIGH (ref 70–99)
Potassium: 4 mmol/L (ref 3.5–5.2)
Sodium: 144 mmol/L (ref 134–144)
Total Protein: 7.8 g/dL (ref 6.0–8.5)
eGFR: 73 mL/min/{1.73_m2} (ref >59)

## 2022-01-11 LAB — LIPID PANEL
Chol/HDL Ratio: 3.3 ratio (ref 0.0–4.4)
Cholesterol, Total: 192 mg/dL (ref 100–199)
HDL: 58 mg/dL (ref >39)
LDL Chol Calc (NIH): 113 mg/dL — ABNORMAL HIGH (ref 0–99)
Triglycerides: 116 mg/dL (ref 0–149)
VLDL Cholesterol Cal: 21 mg/dL (ref 5–40)

## 2022-01-11 LAB — FOLATE: Folate: 16.8 ng/mL (ref >3.0)

## 2022-01-12 LAB — FECAL OCCULT BLOOD, IMMUNOCHEMICAL: Fecal Occult Bld: NEGATIVE

## 2022-01-15 ENCOUNTER — Other Ambulatory Visit: Payer: Self-pay

## 2022-01-15 ENCOUNTER — Other Ambulatory Visit (INDEPENDENT_AMBULATORY_CARE_PROVIDER_SITE_OTHER): Payer: Self-pay | Admitting: Primary Care

## 2022-01-15 MED ORDER — ROSUVASTATIN CALCIUM 20 MG PO TABS
20.0000 mg | ORAL_TABLET | Freq: Every day | ORAL | 1 refills | Status: DC
  Filled 2022-01-15 – 2022-01-28 (×2): qty 30, 30d supply, fill #0
  Filled 2022-03-04: qty 30, 30d supply, fill #1
  Filled 2022-04-05: qty 30, 30d supply, fill #2
  Filled 2022-05-03: qty 30, 30d supply, fill #3
  Filled 2022-06-11: qty 30, 30d supply, fill #4
  Filled 2022-07-19 – 2022-07-31 (×2): qty 30, 30d supply, fill #5

## 2022-01-16 ENCOUNTER — Telehealth (INDEPENDENT_AMBULATORY_CARE_PROVIDER_SITE_OTHER): Payer: Self-pay

## 2022-01-16 ENCOUNTER — Other Ambulatory Visit: Payer: Self-pay

## 2022-01-16 NOTE — Telephone Encounter (Signed)
-----   Message from Kerin Perna, NP sent at 01/15/2022 10:32 AM EDT ----- ?Labs are normal negative FOBT repeat in 1 year. Cholesterol has drastically improved . LDL remains elevated this is considered bad cholesterol. You are rosuvastatin '40mg'$  . Will send in new order for rosuvastatin '20mg'$  .  Healthy lifestyle diet of fruits vegetables fish nuts whole grains and low saturated fat . Foods high in cholesterol or liver, fatty meats,cheese, butter avocados, nuts and seeds, chocolate and fried foods. ? ? ? ?  ?

## 2022-01-16 NOTE — Telephone Encounter (Signed)
Patient is aware that labs are normal and cholesterol has improved so rosuvastatin was decreased from '40mg'$  to '20mg'$ . New prescription sent to pharmacy. Aware of negative FIT test, repeat in one year. She verbalized understanding. Nat Christen, CMA  ?

## 2022-01-22 ENCOUNTER — Other Ambulatory Visit: Payer: Self-pay

## 2022-01-28 ENCOUNTER — Other Ambulatory Visit: Payer: Self-pay

## 2022-01-29 ENCOUNTER — Other Ambulatory Visit: Payer: Self-pay

## 2022-01-30 ENCOUNTER — Other Ambulatory Visit: Payer: Self-pay

## 2022-03-04 ENCOUNTER — Other Ambulatory Visit: Payer: Self-pay

## 2022-03-05 ENCOUNTER — Other Ambulatory Visit: Payer: Self-pay

## 2022-03-07 ENCOUNTER — Other Ambulatory Visit: Payer: Self-pay

## 2022-04-05 ENCOUNTER — Other Ambulatory Visit (HOSPITAL_COMMUNITY): Payer: Self-pay | Admitting: Psychiatry

## 2022-04-05 ENCOUNTER — Other Ambulatory Visit: Payer: Self-pay

## 2022-04-05 DIAGNOSIS — F319 Bipolar disorder, unspecified: Secondary | ICD-10-CM

## 2022-04-05 MED ORDER — QUETIAPINE FUMARATE 400 MG PO TABS
800.0000 mg | ORAL_TABLET | Freq: Every day | ORAL | 3 refills | Status: DC
  Filled 2022-04-05: qty 60, 30d supply, fill #0
  Filled 2022-05-03: qty 60, 30d supply, fill #1
  Filled 2022-06-11: qty 60, 30d supply, fill #2
  Filled 2022-07-19: qty 40, 20d supply, fill #3
  Filled 2022-07-19: qty 20, 10d supply, fill #3
  Filled 2022-07-31: qty 40, 20d supply, fill #4

## 2022-04-11 ENCOUNTER — Other Ambulatory Visit: Payer: Self-pay

## 2022-04-11 ENCOUNTER — Ambulatory Visit (INDEPENDENT_AMBULATORY_CARE_PROVIDER_SITE_OTHER): Payer: Self-pay | Admitting: Primary Care

## 2022-04-11 ENCOUNTER — Encounter (INDEPENDENT_AMBULATORY_CARE_PROVIDER_SITE_OTHER): Payer: Self-pay | Admitting: Primary Care

## 2022-04-11 VITALS — BP 120/82 | HR 71 | Temp 97.9°F | Ht 63.0 in | Wt 168.8 lb

## 2022-04-11 DIAGNOSIS — E782 Mixed hyperlipidemia: Secondary | ICD-10-CM

## 2022-04-11 DIAGNOSIS — E119 Type 2 diabetes mellitus without complications: Secondary | ICD-10-CM

## 2022-04-11 DIAGNOSIS — R7303 Prediabetes: Secondary | ICD-10-CM

## 2022-04-11 DIAGNOSIS — Z760 Encounter for issue of repeat prescription: Secondary | ICD-10-CM

## 2022-04-11 DIAGNOSIS — I10 Essential (primary) hypertension: Secondary | ICD-10-CM

## 2022-04-11 LAB — POCT GLYCOSYLATED HEMOGLOBIN (HGB A1C): Hemoglobin A1C: 6.9 % — AB (ref 4.0–5.6)

## 2022-04-11 MED ORDER — METFORMIN HCL ER 500 MG PO TB24
500.0000 mg | ORAL_TABLET | Freq: Every day | ORAL | 1 refills | Status: DC
  Filled 2022-04-11 – 2022-04-19 (×2): qty 30, 30d supply, fill #0
  Filled 2022-06-11: qty 30, 30d supply, fill #1
  Filled 2022-07-19 – 2022-07-31 (×2): qty 30, 30d supply, fill #2
  Filled 2022-09-03: qty 30, 30d supply, fill #3
  Filled 2022-10-04 – 2022-12-18 (×4): qty 30, 30d supply, fill #4
  Filled 2023-03-18 – 2023-03-26 (×2): qty 30, 30d supply, fill #5

## 2022-04-11 MED ORDER — AMLODIPINE BESYLATE 10 MG PO TABS
10.0000 mg | ORAL_TABLET | Freq: Every day | ORAL | 1 refills | Status: DC
  Filled 2022-04-11: qty 90, 90d supply, fill #0
  Filled 2022-05-03: qty 30, 30d supply, fill #0
  Filled 2022-06-11: qty 30, 30d supply, fill #1
  Filled 2022-07-19 – 2022-07-31 (×2): qty 30, 30d supply, fill #2
  Filled 2022-09-03: qty 30, 30d supply, fill #3
  Filled 2022-10-04 – 2022-12-18 (×4): qty 30, 30d supply, fill #4
  Filled 2023-03-18 – 2023-03-26 (×2): qty 30, 30d supply, fill #5

## 2022-04-14 NOTE — Progress Notes (Signed)
Subjective:  Patient ID: Angel Hughes, female    DOB: [birth year]  Age: 61 y.o. MRN: [id number]  CC: Hypertension   HPI Cova Knieriem presents for follow-up of diabetes. Patient does not check blood sugar at home and hypertension . Patient has No headache, No chest pain, No abdominal pain - No Nausea, No new weakness tingling or numbness, No Cough - shortness of breath   Compliant with meds - Yes mostly Checking CBGs? No  Fasting avg -   Postprandial average -  Exercising regularly? - Yes Watching carbohydrate intake? - Yes Neuropathy ? - No Hypoglycemic events - No  - Recovers with :   Pertinent ROS:  Polyuria - No Polydipsia - No Vision problems - No  Medications as noted below. Taking them regularly without complication/adverse reaction being reported today.   History Nardos has a past medical history of Allergic rhinitis, Anemia, Anxiety, Arthritis, Bipolar disorder ([place]), Depression, GERD (gastroesophageal reflux disease), Hyperlipemia, Hypertension, and Obesity.   She has a past surgical history that includes Abdominal hysterectomy; Wrist surgery; Z-plasty repair; and Cesarean section.   Her family history includes Diabetes in her brother, maternal grandmother, mother, paternal grandmother, and sister; Heart disease in her sister; Hypertension in her maternal grandmother, mother, paternal grandmother, and sister.She reports that she has been smoking cigarettes. She has a 17.00 pack-year smoking history. She has never used smokeless tobacco. She reports current alcohol use. She reports that she does not use drugs.  Current Outpatient Medications on File Prior to Visit  Medication Sig Dispense Refill   BD [employer] MISC Place used needles and syringes in sharps container 1 each 0   benztropine (COGENTIN) 1 MG tablet Take 1 tablet (1 mg total) by mouth 2 (two) times daily. 60 tablet 3   Cyanocobalamin (VITAMIN DEFICIENCY SYSTEM-B12) 1000 MCG/ML KIT 1000 mcg daily  for 1 week, then weekly for 1 month, then monthly for a year 1 kit 0   DULoxetine (CYMBALTA) 60 MG capsule Take 1 capsule (60 mg total) by mouth 2 (two) times daily. 60 capsule 3   hydrOXYzine (VISTARIL) 50 MG capsule Take 1 capsule (50 mg total) by mouth every 4 (four) hours as needed. 120 capsule 3   Oxcarbazepine (TRILEPTAL) 300 MG tablet Take 1 tablet (300 mg total) by mouth 2 (two) times daily. 60 tablet 3   propranolol (INDERAL) 20 MG tablet TAKE 1 TABLET BY MOUTH TWICE A DAY AS NEEDED FOR PANIC, AGITATION, SLEEP AND ANXIETY 60 tablet 3   QUEtiapine (SEROQUEL) 400 MG tablet Take 2 tablets (800 mg total) by mouth at bedtime. 60 tablet 3   rosuvastatin (CRESTOR) 20 MG tablet Take 1 tablet (20 mg total) by mouth daily. 90 tablet 1   SYRINGE-NEEDLE, DISP, 3 ML 22G X 1-1/2" 3 ML MISC Use a new needle and syringe for each injection. 50 each 0   No current facility-administered medications on file prior to visit.    ROS Comprehensive ROS Pertinent positive and negative noted in HPI    Objective:  BP 120/82   Pulse 71   Temp 97.9 F (36.6 C) (Oral)   Ht _0  (1.6 m)   Wt 168 lb 12.8 oz (76.6 kg)   SpO2 95%   BMI 29.90 kg/m   BP Readings from Last 3 Encounters:  04/11/22 120/82  01/10/22 134/77  08/14/21 140/82    Wt Readings from Last 3 Encounters:  04/11/22 168 lb 12.8 oz (76.6 kg)  01/10/22 167 lb 6.4 oz (75.9  kg)  08/14/21 170 lb 9.6 oz (77.4 kg)    Physical Exam Vitals reviewed.  Constitutional:      Appearance: She is obese.  HENT:     Head: Normocephalic.     Right Ear: Tympanic membrane and external ear normal.     Left Ear: Tympanic membrane and external ear normal.     Nose: Nose normal.  Eyes:     Extraocular Movements: Extraocular movements intact.     Pupils: Pupils are equal, round, and reactive to light.  Cardiovascular:     Rate and Rhythm: Normal rate and regular rhythm.  Pulmonary:     Effort: Pulmonary effort is normal.     Breath sounds: Normal  breath sounds.  Abdominal:     General: Bowel sounds are normal. There is distension.     Palpations: Abdomen is soft.  Musculoskeletal:        General: Normal range of motion.     Cervical back: Normal range of motion and neck supple.  Skin:    General: Skin is warm and dry.  Neurological:     Mental Status: She is alert and oriented to person, place, and time.  Psychiatric:        Mood and Affect: Mood normal.        Behavior: Behavior normal.   Lab Results  Component Value Date   HGBA1C 6.9 (A) 04/11/2022   HGBA1C 6.3 (A) 08/14/2021   HGBA1C 6.3 (A) 02/14/2020    Lab Results  Component Value Date   WBC 7.7 08/14/2021   HGB 14.3 08/14/2021   HCT 43.4 08/14/2021   PLT 320 08/14/2021   GLUCOSE 108 (H) 01/10/2022   CHOL 192 01/10/2022   TRIG 116 01/10/2022   HDL 58 01/10/2022   LDLCALC 113 (H) 01/10/2022   ALT 19 01/10/2022   AST 21 01/10/2022   NA 144 01/10/2022   K 4.0 01/10/2022   CL 105 01/10/2022   CREATININE 0.90 01/10/2022   BUN 9 01/10/2022   CO2 23 01/10/2022   TSH 0.86 10/23/2020   HGBA1C 6.9 (A) 04/11/2022     Assessment & Plan:   Mila was seen today for hypertension.  Diagnoses and all orders for this visit:  Essential hypertension Well controlled  blood pressure is at goal of less than 140/90, low-sodium, DASH diet, medication compliance, 150 minutes of moderate intensity exercise per week. Discussed medication compliance, adverse effects.  -     amLODipine (NORVASC) 10 MG tablet; Take 1 tablet (10 mg total) by mouth daily. -     CMP14+EGFR  Type 2 diabetes mellitus without complication, without long-term current use of insulin (HCC)  A1c has increased from 6.3 8 months ago.  No change in medication discussed monitoring carbohydrates to include rice, breads, sweets, sodas, potatoes candy and sweets.  Encouraged to exercise daily 30 minutes.  Explained if A1c on follow-up is not low or will have to increase medication or add an additional  diabetic medication. -     HgB A1c 6.9 continued metformin XR 500 mg daily -     CBC with Differential  Mixed hyperlipidemia  Healthy lifestyle diet of fruits vegetables fish nuts whole grains and low saturated fat . Foods high in cholesterol or liver, fatty meats,cheese, butter avocados, nuts and seeds, chocolate and fried foods. -     Lipid Panel  Other orders/medication refill -     metFORMIN (GLUCOPHAGE-XR) 500 MG 24 hr tablet; Take 1 tablet (500 mg total) by  mouth daily with breakfast. Amlodipine 10 mg daily  I am having Angel Hughes start on metFORMIN. I am also having her maintain her Vitamin Deficiency System-B12, SYRINGE-NEEDLE (DISP) 3 ML, BD [employer], benztropine, DULoxetine, hydrOXYzine, Oxcarbazepine, propranolol, rosuvastatin, QUEtiapine, and amLODipine.  Meds ordered this encounter  Medications   amLODipine (NORVASC) 10 MG tablet    Sig: Take 1 tablet (10 mg total) by mouth daily.    Dispense:  90 tablet    Refill:  1    Order Specific Question:   Supervising Provider    Answer:   Tresa Garter [[id number]]   metFORMIN (GLUCOPHAGE-XR) 500 MG 24 hr tablet    Sig: Take 1 tablet (500 mg total) by mouth daily with breakfast.    Dispense:  90 tablet    Refill:  1    Order Specific Question:   Supervising Provider    Answer:   Tresa Garter [[id number]]     Follow-up:   Return in about 3 months (around 07/12/2022) for DM/HTN.  The above assessment and management plan was discussed with the patient. The patient verbalized understanding of and has agreed to the management plan. Patient is aware to call the clinic if symptoms fail to improve or worsen. Patient is aware when to return to the clinic for a follow-up visit. Patient educated on when it is appropriate to go to the emergency department.   Juluis Mire, NP-C

## 2022-04-15 ENCOUNTER — Other Ambulatory Visit: Payer: Self-pay

## 2022-04-17 ENCOUNTER — Other Ambulatory Visit: Payer: Self-pay

## 2022-04-19 ENCOUNTER — Other Ambulatory Visit: Payer: Self-pay

## 2022-04-19 ENCOUNTER — Other Ambulatory Visit (HOSPITAL_COMMUNITY): Payer: Self-pay | Admitting: Psychiatry

## 2022-04-19 DIAGNOSIS — F319 Bipolar disorder, unspecified: Secondary | ICD-10-CM

## 2022-04-19 DIAGNOSIS — F411 Generalized anxiety disorder: Secondary | ICD-10-CM

## 2022-04-25 ENCOUNTER — Other Ambulatory Visit: Payer: Self-pay

## 2022-04-25 MED ORDER — HYDROXYZINE PAMOATE 50 MG PO CAPS
50.0000 mg | ORAL_CAPSULE | ORAL | 3 refills | Status: DC | PRN
  Filled 2022-04-25 – 2022-05-03 (×2): qty 120, 20d supply, fill #0
  Filled 2022-06-11: qty 120, 20d supply, fill #1
  Filled 2022-07-19 – 2022-07-31 (×2): qty 120, 20d supply, fill #2

## 2022-04-25 MED ORDER — DULOXETINE HCL 60 MG PO CPEP
60.0000 mg | ORAL_CAPSULE | Freq: Two times a day (BID) | ORAL | 3 refills | Status: DC
  Filled 2022-04-25 – 2022-05-03 (×2): qty 60, 30d supply, fill #0
  Filled 2022-06-11: qty 60, 30d supply, fill #1
  Filled 2022-07-19 – 2022-07-31 (×2): qty 60, 30d supply, fill #2

## 2022-04-25 MED ORDER — BENZTROPINE MESYLATE 1 MG PO TABS
1.0000 mg | ORAL_TABLET | Freq: Two times a day (BID) | ORAL | 3 refills | Status: DC
  Filled 2022-04-25 – 2022-05-03 (×2): qty 60, 30d supply, fill #0
  Filled 2022-06-11: qty 60, 30d supply, fill #1
  Filled 2022-07-19 – 2022-07-31 (×2): qty 60, 30d supply, fill #2

## 2022-04-25 MED ORDER — OXCARBAZEPINE 300 MG PO TABS
300.0000 mg | ORAL_TABLET | Freq: Two times a day (BID) | ORAL | 3 refills | Status: DC
  Filled 2022-04-25 – 2022-05-03 (×2): qty 60, 30d supply, fill #0
  Filled 2022-06-11: qty 60, 30d supply, fill #1
  Filled 2022-07-19 – 2022-07-31 (×2): qty 60, 30d supply, fill #2

## 2022-04-25 NOTE — Telephone Encounter (Signed)
Received request for refills on patients prescriptions.  These were sent in.   Sent: -Cymbalta 60 mg BID.  60 tablets with 3 refills. -Cogentin 1 mg BID.  60 tablets with 3 refills. -Hydroxyzine 50 mg q4 PRN. 120 tablets with 3 refills. -Trileptal 300 mg BID.  60 tablets with 3 refills.   Fatima Sanger MD Resident

## 2022-05-02 ENCOUNTER — Other Ambulatory Visit: Payer: Self-pay

## 2022-05-03 ENCOUNTER — Other Ambulatory Visit: Payer: Self-pay

## 2022-05-06 ENCOUNTER — Other Ambulatory Visit: Payer: Self-pay

## 2022-06-11 ENCOUNTER — Other Ambulatory Visit: Payer: Self-pay

## 2022-06-11 ENCOUNTER — Other Ambulatory Visit (HOSPITAL_COMMUNITY): Payer: Self-pay | Admitting: Primary Care

## 2022-06-12 ENCOUNTER — Other Ambulatory Visit: Payer: Self-pay

## 2022-06-12 MED ORDER — PROPRANOLOL HCL 20 MG PO TABS
20.0000 mg | ORAL_TABLET | Freq: Two times a day (BID) | ORAL | 3 refills | Status: DC | PRN
  Filled 2022-06-12: qty 60, 30d supply, fill #0
  Filled 2022-07-19 – 2022-07-31 (×2): qty 60, 30d supply, fill #1
  Filled 2022-09-03: qty 60, 30d supply, fill #2
  Filled 2022-10-04 – 2023-01-31 (×5): qty 60, 30d supply, fill #3

## 2022-06-12 NOTE — Telephone Encounter (Signed)
Requested medication (s) are due for refill today: Yes  Requested medication (s) are on the active medication list: Yes  Last refill:  11/13/21  Future visit scheduled: No  Notes to clinic:  Last filled by Brittney Parson,N.P.    Requested Prescriptions  Pending Prescriptions Disp Refills   propranolol (INDERAL) 20 MG tablet 60 tablet 3    Sig: TAKE 1 TABLET BY MOUTH TWICE A DAY AS NEEDED FOR PANIC, AGITATION, SLEEP AND ANXIETY     There is no refill protocol information for this order

## 2022-06-13 ENCOUNTER — Other Ambulatory Visit: Payer: Self-pay

## 2022-07-12 ENCOUNTER — Ambulatory Visit (INDEPENDENT_AMBULATORY_CARE_PROVIDER_SITE_OTHER): Payer: Self-pay | Admitting: Primary Care

## 2022-07-19 ENCOUNTER — Other Ambulatory Visit: Payer: Self-pay

## 2022-07-22 ENCOUNTER — Other Ambulatory Visit: Payer: Self-pay

## 2022-07-25 ENCOUNTER — Other Ambulatory Visit: Payer: Self-pay

## 2022-07-29 ENCOUNTER — Other Ambulatory Visit: Payer: Self-pay

## 2022-07-31 ENCOUNTER — Other Ambulatory Visit: Payer: Self-pay

## 2022-08-01 ENCOUNTER — Ambulatory Visit (INDEPENDENT_AMBULATORY_CARE_PROVIDER_SITE_OTHER): Payer: 59 | Admitting: Primary Care

## 2022-08-01 ENCOUNTER — Encounter (INDEPENDENT_AMBULATORY_CARE_PROVIDER_SITE_OTHER): Payer: Self-pay | Admitting: Primary Care

## 2022-08-01 ENCOUNTER — Other Ambulatory Visit: Payer: Self-pay

## 2022-08-01 VITALS — BP 102/72 | HR 81 | Resp 16 | Wt 161.8 lb

## 2022-08-01 DIAGNOSIS — E119 Type 2 diabetes mellitus without complications: Secondary | ICD-10-CM | POA: Diagnosis not present

## 2022-08-01 DIAGNOSIS — Z23 Encounter for immunization: Secondary | ICD-10-CM | POA: Diagnosis not present

## 2022-08-01 DIAGNOSIS — Z1231 Encounter for screening mammogram for malignant neoplasm of breast: Secondary | ICD-10-CM | POA: Diagnosis not present

## 2022-08-01 DIAGNOSIS — Z6828 Body mass index (BMI) 28.0-28.9, adult: Secondary | ICD-10-CM

## 2022-08-01 DIAGNOSIS — I10 Essential (primary) hypertension: Secondary | ICD-10-CM | POA: Diagnosis not present

## 2022-08-01 DIAGNOSIS — Z1211 Encounter for screening for malignant neoplasm of colon: Secondary | ICD-10-CM

## 2022-08-01 DIAGNOSIS — E782 Mixed hyperlipidemia: Secondary | ICD-10-CM

## 2022-08-01 DIAGNOSIS — M6740 Ganglion, unspecified site: Secondary | ICD-10-CM

## 2022-08-01 NOTE — Progress Notes (Signed)
Subjective:  Patient ID: Angel Hughes, female    DOB: 13-Jul-1961  Age: 61 y.o. MRN: [id number]  CC: Diabetes and Hypertension   HPI [place] Mckeough presents for follow-up of diabetes. Patient does not check blood sugar at home  Compliant with meds - Yes Checking CBGs? No  Fasting avg -   Postprandial average -  Exercising regularly? - Yes Watching carbohydrate intake? - Yes Neuropathy ? - Yes Hypoglycemic events - No  - Recovers with :   Pertinent ROS:  Polyuria - Yes Polydipsia - No Vision problems - No  Hypertension  Well controlled . Patient has No headache, No chest pain, No abdominal pain - No Nausea, No new weakness tingling or numbness, No Cough - shortness of breath  Medications as noted below. Taking them regularly without complication/adverse reaction being reported today.   History Jaeline has a past medical history of Allergic rhinitis, Anemia, Anxiety, Arthritis, Bipolar disorder ([place]), Depression, GERD (gastroesophageal reflux disease), Hyperlipemia, Hypertension, and Obesity.   She has a past surgical history that includes Abdominal hysterectomy; Wrist surgery; Z-plasty repair; and Cesarean section.   Her family history includes Diabetes in her brother, maternal grandmother, mother, paternal grandmother, and sister; Heart disease in her sister; Hypertension in her maternal grandmother, mother, paternal grandmother, and sister.She reports that she has been smoking cigarettes. She has a 17.00 pack-year smoking history. She has never used smokeless tobacco. She reports current alcohol use. She reports that she does not use drugs.  Current Outpatient Medications on File Prior to Visit  Medication Sig Dispense Refill   amLODipine (NORVASC) 10 MG tablet Take 1 tablet (10 mg total) by mouth daily. 90 tablet 1   BD [employer] MISC Place used needles and syringes in sharps container 1 each 0   benztropine (COGENTIN) 1 MG tablet Take 1 tablet (1 mg total) by mouth  2 (two) times daily. 60 tablet 3   Cyanocobalamin (VITAMIN DEFICIENCY SYSTEM-B12) 1000 MCG/ML KIT 1000 mcg daily for 1 week, then weekly for 1 month, then monthly for a year 1 kit 0   DULoxetine (CYMBALTA) 60 MG capsule Take 1 capsule (60 mg total) by mouth 2 (two) times daily. 60 capsule 3   hydrOXYzine (VISTARIL) 50 MG capsule Take 1 capsule (50 mg total) by mouth every 4 (four) hours as needed. 120 capsule 3   metFORMIN (GLUCOPHAGE-XR) 500 MG 24 hr tablet Take 1 tablet (500 mg total) by mouth daily with breakfast. 90 tablet 1   Oxcarbazepine (TRILEPTAL) 300 MG tablet Take 1 tablet (300 mg total) by mouth 2 (two) times daily. 60 tablet 3   propranolol (INDERAL) 20 MG tablet Take 1 tablet (20 mg total) by mouth 2 (two) times daily as needed for panic,agitation,sleep, and anxiety. 60 tablet 3   QUEtiapine (SEROQUEL) 400 MG tablet Take 2 tablets (800 mg total) by mouth at bedtime. 60 tablet 3   rosuvastatin (CRESTOR) 20 MG tablet Take 1 tablet (20 mg total) by mouth daily. 90 tablet 1   SYRINGE-NEEDLE, DISP, 3 ML 22G X 1-1/2" 3 ML MISC Use a new needle and syringe for each injection. 50 each 0   No current facility-administered medications on file prior to visit.    ROS Comprehensive ROS Pertinent positive and negative noted in HPI    Objective:  BP 102/72   Pulse 81   Resp 16   Wt 161 lb 12.8 oz (73.4 kg)   SpO2 96%   BMI 28.66 kg/m   BP Readings from  Last 3 Encounters:  08/01/22 102/72  04/11/22 120/82  01/10/22 134/77    Wt Readings from Last 3 Encounters:  08/01/22 161 lb 12.8 oz (73.4 kg)  04/11/22 168 lb 12.8 oz (76.6 kg)  01/10/22 167 lb 6.4 oz (75.9 kg)    Physical Exam Vitals reviewed.  Constitutional:      Appearance: She is normal weight.  HENT:     Head: Normocephalic.     Right Ear: Tympanic membrane and external ear normal.     Left Ear: Tympanic membrane and external ear normal.     Nose: Nose normal.  Eyes:     Extraocular Movements: Extraocular  movements intact.     Pupils: Pupils are equal, round, and reactive to light.  Cardiovascular:     Rate and Rhythm: Normal rate and regular rhythm.  Pulmonary:     Effort: Pulmonary effort is normal.     Breath sounds: Normal breath sounds.  Abdominal:     General: Bowel sounds are normal. There is distension.     Palpations: Abdomen is soft.  Musculoskeletal:        General: Normal range of motion.  Skin:    General: Skin is warm and dry.  Neurological:     Mental Status: She is alert and oriented to person, place, and time.  Psychiatric:        Mood and Affect: Mood normal.        Behavior: Behavior normal.    Lab Results  Component Value Date   HGBA1C 6.9 (A) 04/11/2022   HGBA1C 6.3 (A) 08/14/2021   HGBA1C 6.3 (A) 02/14/2020    Lab Results  Component Value Date   WBC 5.3 08/01/2022   HGB 12.9 08/01/2022   HCT 37.0 08/01/2022   PLT 283 08/01/2022   GLUCOSE 119 (H) 08/01/2022   CHOL 136 08/01/2022   TRIG 104 08/01/2022   HDL 49 08/01/2022   LDLCALC 68 08/01/2022   ALT 13 08/01/2022   AST 16 08/01/2022   NA 143 08/01/2022   K 4.3 08/01/2022   CL 105 08/01/2022   CREATININE 1.07 (H) 08/01/2022   BUN 12 08/01/2022   CO2 25 08/01/2022   TSH 0.86 10/23/2020   HGBA1C 6.9 (A) 04/11/2022     Assessment & Plan:   Georgeana was seen today for diabetes and hypertension.  Diagnoses and all orders for this visit:  Need for immunization against influenza -     Flu Vaccine QUAD 32moIM (Fluarix, Fluzone & Alfiuria Quad PF)  Type 2 diabetes mellitus without complication, without long-term current use of insulin ([place] - educated on lifestyle modifications, including but not limited to diet choices and adding exercise to daily routine.   -     Microalbumin / creatinine urine ratio  Essential hypertension BP goal - < 130/80 met Explained that having normal blood pressure is the goal and medications are helping to get to goal and maintain normal blood pressure. DIET: Limit  salt intake, read nutrition labels to check salt content, limit fried and high fatty foods  Avoid using multisymptom OTC cold preparations that generally contain sudafed which can rise BP. Consult with pharmacist on best cold relief products to use for persons with HTN EXERCISE Discussed incorporating exercise such as walking - 30 minutes most days of the week and can do in 10 minute intervals     CMP14+EGFR  Encounter for screening mammogram for malignant neoplasm of breast -     MM DIGITAL SCREENING BILATERAL; Future  Colon cancer screening -     Ambulatory referral to Gastroenterology  Encounter for screening mammogram for malignant neoplasm of breast -     MM DIGITAL SCREENING BILATERAL; Future  Colon cancer screening -     Ambulatory referral to Gastroenterology  Ganglion cyst -     Ambulatory referral to General Surgery  Mixed hyperlipidemia -     Lipid Panel  BMI 28.0-28.9,adult Discussed diet and exercise for person with BMI >25. Instructed: You must burn more calories than you eat. Losing 5 percent of your body weight should be considered a success. In the longer term, losing more than 15 percent of your body weight and staying at this weight is an extremely good result. However, keep in mind that even losing 5 percent of your body weight leads to important health benefits, so try not to get discouraged if you're not able to lose more than this. Will recheck weight in 3-6 months.      I am having Angel Hughes maintain her Vitamin Deficiency System-B12, SYRINGE-NEEDLE (DISP) 3 ML, BD [employer], rosuvastatin, QUEtiapine, amLODipine, metFORMIN, benztropine, DULoxetine, hydrOXYzine, Oxcarbazepine, and propranolol.    Follow-up:   Return in about 3 months (around 11/01/2022) for bp.  The above assessment and management plan was discussed with the patient. The patient verbalized understanding of and has agreed to the management plan. Patient is aware to call the  clinic if symptoms fail to improve or worsen. Patient is aware when to return to the clinic for a follow-up visit. Patient educated on when it is appropriate to go to the emergency department.   Juluis Mire, NP-C

## 2022-08-02 LAB — CMP14+EGFR
ALT: 13 IU/L (ref 0–32)
AST: 16 IU/L (ref 0–40)
Albumin/Globulin Ratio: 1.8 (ref 1.2–2.2)
Albumin: 4.6 g/dL (ref 3.9–4.9)
Alkaline Phosphatase: 75 IU/L (ref 44–121)
BUN/Creatinine Ratio: 11 — ABNORMAL LOW (ref 12–28)
BUN: 12 mg/dL (ref 8–27)
Bilirubin Total: 0.2 mg/dL (ref 0.0–1.2)
CO2: 25 mmol/L (ref 20–29)
Calcium: 9.5 mg/dL (ref 8.7–10.3)
Chloride: 105 mmol/L (ref 96–106)
Creatinine, Ser: 1.07 mg/dL — ABNORMAL HIGH (ref 0.57–1.00)
Globulin, Total: 2.6 g/dL (ref 1.5–4.5)
Glucose: 119 mg/dL — ABNORMAL HIGH (ref 70–99)
Potassium: 4.3 mmol/L (ref 3.5–5.2)
Sodium: 143 mmol/L (ref 134–144)
Total Protein: 7.2 g/dL (ref 6.0–8.5)
eGFR: 59 mL/min/{1.73_m2} — ABNORMAL LOW (ref >59)

## 2022-08-02 LAB — LIPID PANEL
Chol/HDL Ratio: 2.8 ratio (ref 0.0–4.4)
Cholesterol, Total: 136 mg/dL (ref 100–199)
HDL: 49 mg/dL (ref >39)
LDL Chol Calc (NIH): 68 mg/dL (ref 0–99)
Triglycerides: 104 mg/dL (ref 0–149)
VLDL Cholesterol Cal: 19 mg/dL (ref 5–40)

## 2022-08-02 LAB — CBC WITH DIFFERENTIAL/PLATELET
Basophils Absolute: 0 10*3/uL (ref 0.0–0.2)
Basos: 1 %
EOS (ABSOLUTE): 0.2 10*3/uL (ref 0.0–0.4)
Eos: 3 %
Hematocrit: 37 % (ref 34.0–46.6)
Hemoglobin: 12.9 g/dL (ref 11.1–15.9)
Immature Grans (Abs): 0 10*3/uL (ref 0.0–0.1)
Immature Granulocytes: 0 %
Lymphocytes Absolute: 1.5 10*3/uL (ref 0.7–3.1)
Lymphs: 28 %
MCH: 27.5 pg (ref 26.6–33.0)
MCHC: 34.9 g/dL (ref 31.5–35.7)
MCV: 79 fL (ref 79–97)
Monocytes Absolute: 0.5 10*3/uL (ref 0.1–0.9)
Monocytes: 10 %
Neutrophils Absolute: 3.1 10*3/uL (ref 1.4–7.0)
Neutrophils: 58 %
Platelets: 283 10*3/uL (ref 150–450)
RBC: 4.69 x10E6/uL (ref 3.77–5.28)
RDW: 15 % (ref 11.7–15.4)
WBC: 5.3 10*3/uL (ref 3.4–10.8)

## 2022-08-02 LAB — MICROALBUMIN / CREATININE URINE RATIO
Creatinine, Urine: 234.1 mg/dL
Microalb/Creat Ratio: 29 mg/g creat (ref 0–29)
Microalbumin, Urine: 67.4 ug/mL

## 2022-08-07 ENCOUNTER — Other Ambulatory Visit: Payer: Self-pay

## 2022-08-08 ENCOUNTER — Encounter (HOSPITAL_COMMUNITY): Payer: Self-pay | Admitting: Student in an Organized Health Care Education/Training Program

## 2022-08-08 ENCOUNTER — Other Ambulatory Visit: Payer: Self-pay

## 2022-08-08 ENCOUNTER — Ambulatory Visit (INDEPENDENT_AMBULATORY_CARE_PROVIDER_SITE_OTHER): Payer: No Payment, Other | Admitting: Student in an Organized Health Care Education/Training Program

## 2022-08-08 VITALS — BP 132/89 | HR 77 | Wt 164.0 lb

## 2022-08-08 DIAGNOSIS — F319 Bipolar disorder, unspecified: Secondary | ICD-10-CM

## 2022-08-08 DIAGNOSIS — F411 Generalized anxiety disorder: Secondary | ICD-10-CM | POA: Diagnosis not present

## 2022-08-08 DIAGNOSIS — F1020 Alcohol dependence, uncomplicated: Secondary | ICD-10-CM

## 2022-08-08 MED ORDER — NALTREXONE HCL 50 MG PO TABS
50.0000 mg | ORAL_TABLET | Freq: Every day | ORAL | 1 refills | Status: DC
  Filled 2022-08-08 – 2022-12-30 (×4): qty 30, 30d supply, fill #0

## 2022-08-08 MED ORDER — QUETIAPINE FUMARATE 400 MG PO TABS
800.0000 mg | ORAL_TABLET | Freq: Every day | ORAL | 3 refills | Status: DC
  Filled 2022-09-03: qty 48, 24d supply, fill #0
  Filled 2022-09-03: qty 12, 6d supply, fill #0
  Filled 2022-10-04: qty 60, 30d supply, fill #1
  Filled 2022-11-04 – 2022-11-15 (×2): qty 60, 30d supply, fill #2
  Filled 2022-12-30: qty 60, 30d supply, fill #3

## 2022-08-08 MED ORDER — OXCARBAZEPINE 300 MG PO TABS
300.0000 mg | ORAL_TABLET | Freq: Two times a day (BID) | ORAL | 3 refills | Status: DC
  Filled 2022-09-03: qty 60, 30d supply, fill #0
  Filled 2022-10-04 – 2022-12-18 (×4): qty 60, 30d supply, fill #1

## 2022-08-08 MED ORDER — DULOXETINE HCL 60 MG PO CPEP
60.0000 mg | ORAL_CAPSULE | Freq: Two times a day (BID) | ORAL | 3 refills | Status: DC
  Filled 2022-09-03: qty 60, 30d supply, fill #0
  Filled 2022-10-04 – 2022-12-18 (×4): qty 60, 30d supply, fill #1

## 2022-08-08 MED ORDER — NALTREXONE HCL 50 MG PO TABS
25.0000 mg | ORAL_TABLET | Freq: Every day | ORAL | 0 refills | Status: DC
  Filled 2022-08-08: qty 7, 14d supply, fill #0

## 2022-08-08 MED ORDER — HYDROXYZINE PAMOATE 50 MG PO CAPS
50.0000 mg | ORAL_CAPSULE | Freq: Three times a day (TID) | ORAL | 3 refills | Status: DC | PRN
  Filled 2022-08-08 (×2): qty 90, 30d supply, fill #0
  Filled 2022-09-03: qty 90, 30d supply, fill #1
  Filled 2022-10-04 – 2022-11-15 (×3): qty 90, 30d supply, fill #2

## 2022-08-08 MED ORDER — BENZTROPINE MESYLATE 1 MG PO TABS
1.0000 mg | ORAL_TABLET | Freq: Every day | ORAL | 3 refills | Status: DC
  Filled 2022-08-08 (×2): qty 30, 30d supply, fill #0
  Filled 2022-09-03: qty 30, 30d supply, fill #1
  Filled 2022-10-04 – 2022-12-18 (×4): qty 30, 30d supply, fill #2

## 2022-08-08 NOTE — Patient Instructions (Signed)
Start Naltrexone  '25mg'$  x 14 days then increase to 1 pill ('50mg'$ ( daily

## 2022-08-08 NOTE — Progress Notes (Signed)
Psychiatric Initial Adult Assessment   Patient Identification: Angel Hughes MRN:  [id number] Date of Evaluation:  08/08/2022 Referral Source: Previous patient Chief Complaint:   Chief Complaint  Patient presents with   Depression   Visit Diagnosis:    ICD-10-CM   1. Alcohol use disorder, severe, dependence (HCC)  F10.20 naltrexone (DEPADE) 50 MG tablet    naltrexone (DEPADE) 50 MG tablet    2. Generalized anxiety disorder  F41.1 DULoxetine (CYMBALTA) 60 MG capsule    hydrOXYzine (VISTARIL) 50 MG capsule    3. Bipolar disorder with depression (HCC)  F31.9 DULoxetine (CYMBALTA) 60 MG capsule    Oxcarbazepine (TRILEPTAL) 300 MG tablet    QUEtiapine (SEROQUEL) 400 MG tablet    benztropine (COGENTIN) 1 MG tablet      History of Present Illness:  Angel Hughes is a 61 yo patient with a PPH of Bipolar disorder and Etoh Use disorder and a PMH of Etoh induced pancreatitis. Patient reports that she has been taking her medications:  Cogentin 6m BID Cymbalta 666mBID Trileptal 30071mID Seroquel 800m72mS  Patient reports that she had previously been well maintained on these medications, but as been feeling more dysphoric lately. Patient reports that she has been having increased dysphoric mood and decreased need for sleep. Patient reports that she has been having increased anhedonia including poor hygiene. Patient reports that she has had low energy, decreased showering, and decreased appetite with poor concentration. Patient reports that she is not entirely sure that she has a "drinking problem" but endorses that she drinks daily. Patient reports that she had beer this AM, and will drink different forms of Etoh throughout the day. Patient reports that she has a hx of passing out and having to be taking to the hospital after drinking. Patient denies any hx of withdrawal symptoms in the past, but is not able to give a clear answer reporting detox longer than 2 days. Patient reports that she is  not having SI, HI and AVH.   Patient reports that she believes she may be having occasional panic attacks, and endorses that these usually entail chills and vomiting. Patient reports that the vomiting can go on for 3 days. Patient and provider discuss that patient's symptoms are more likely related to her Etoh pancreatitis.   Patient reports that she has some stressors related to her loss in her life. Patient reports that she struggles with feeling alone despite living with her son. Patient reports that her mother has been placed in a nursing home and has dementia and she already loss her 2 siblings. Patient reports that she has 3 "true friends" but her dysphoric mood has led to her being more isolated.   Patient is not able to clearly recall a manic episode. Patient reports that she cannot recall ever having decreased need for sleep when her mood is "up." Patient also endorses that she has never done big gestures impulsively and even her multiple facial piercing's were done in practice to her preference, while some where "impulsive" and others were more planned.   Patient does not endorse true PTSD symptoms but does endorse grief from loss of her husband who died many years ago in her arms due to cancer. Patient reports that her last "patient" that she watched died a month ago and she has not worked since.  Associated Signs/Symptoms: Depression Symptoms:  depressed mood, anhedonia, fatigue, difficulty concentrating, anxiety, weight loss, decreased appetite, (Hypo) Manic Symptoms:   Not endorsed Anxiety Symptoms:  denies  Psychotic Symptoms:   denies PTSD Symptoms: NA  Past Psychiatric History:  Dx-Bipolar dx INPT: Negative OPT: Dr. Ronne Binning and [facility] Sessions Previous meds: Prozac, Paxil, Geodon NO SA  Previous Psychotropic Medications: Yes   Substance Abuse History in the last 12 months:  Yes.   Etoh: Daily THC: Positive, very reluctant to give frequency Cigs: 4.5XMI Other illicit  substances denies. Consequences of Substance Abuse: Medical Consequences:  Alcoholic pancreatitis  Past Medical History:  Past Medical History:  Diagnosis Date   Allergic rhinitis    Anemia    Anxiety    Arthritis    Bipolar disorder ([place])    Depression    GERD (gastroesophageal reflux disease)    Hyperlipemia    Hypertension    pt states that she has no hx of elevated BP, on med for hot flashes   Obesity     Past Surgical History:  Procedure Laterality Date   ABDOMINAL HYSTERECTOMY     CESAREAN SECTION     WRIST SURGERY     cyst   Z-PLASTY REPAIR      Family Psychiatric History:  Mom: Some psych hx but unknown specifics Dad: Etoh use disorder  Denies hx of SA  Family History:  Family History  Problem Relation Age of Onset   Diabetes Paternal Grandmother    Hypertension Paternal Grandmother    Diabetes Maternal Grandmother    Hypertension Maternal Grandmother    Diabetes Mother    Hypertension Mother    Diabetes Brother    Diabetes Sister    Heart disease Sister    Hypertension Sister     Social History:   Social History   Socioeconomic History   Marital status: Legally Separated    Spouse name: Not on file   Number of children: Not on file   Years of education: Not on file   Highest education level: Not on file  Occupational History   Not on file  Tobacco Use   Smoking status: Every Day    Packs/day: 0.50    Years: 34.00    Total pack years: 17.00    Types: Cigarettes   Smokeless tobacco: Never  Vaping Use   Vaping Use: Never used  Substance and Sexual Activity   Alcohol use: Yes    Alcohol/week: 0.0 standard drinks of alcohol    Comment: occassionally   Drug use: No   Sexual activity: Not on file  Other Topics Concern   Not on file  Social History Narrative   Right handed    Lives alone    Social Determinants of Health   Financial Resource Strain: Not on file  Food Insecurity: Not on file  Transportation Needs: Not on file   Physical Activity: Not on file  Stress: Not on file  Social Connections: Not on file    Additional Social History:   - Currently unemployed, 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) - Son lives with her Widowed at 46 yo   Allergies:  No Known Allergies  Metabolic Disorder Labs: Lab Results  Component Value Date   HGBA1C 6.9 (A) 04/11/2022   MPG 126 (H) 06/01/2013   No results found for: "PROLACTIN" Lab Results  Component Value Date   CHOL 136 08/01/2022   TRIG 104 08/01/2022   HDL 49 08/01/2022   CHOLHDL 2.8 08/01/2022   VLDL 27 06/01/2013   LDLCALC 68 08/01/2022   LDLCALC 113 (H) 01/10/2022   Lab Results  Component Value Date  TSH 0.86 10/23/2020    Therapeutic Level Labs: No results found for: "LITHIUM" No results found for: "CBMZ" No results found for: "VALPROATE"  Current Medications: Current Outpatient Medications  Medication Sig Dispense Refill   naltrexone (DEPADE) 50 MG tablet Take 0.5 tablets (25 mg total) by mouth daily. 7 tablet 0   naltrexone (DEPADE) 50 MG tablet Take 1 tablet (50 mg total) by mouth daily. 30 tablet 1   amLODipine (NORVASC) 10 MG tablet Take 1 tablet (10 mg total) by mouth daily. 90 tablet 1   BD [employer] Home MISC Place used needles and syringes in sharps container 1 each 0   benztropine (COGENTIN) 1 MG tablet Take 1 tablet (1 mg total) by mouth daily. 30 tablet 3   Cyanocobalamin (VITAMIN DEFICIENCY SYSTEM-B12) 1000 MCG/ML KIT 1000 mcg daily for 1 week, then weekly for 1 month, then monthly for a year 1 kit 0   DULoxetine (CYMBALTA) 60 MG capsule Take 1 capsule (60 mg total) by mouth 2 (two) times daily. 60 capsule 3   hydrOXYzine (VISTARIL) 50 MG capsule Take 1 capsule (50 mg total) by mouth 3 (three) times daily as needed. 120 capsule 3   metFORMIN (GLUCOPHAGE-XR) 500 MG 24 hr tablet Take 1 tablet (500 mg total) by mouth daily with breakfast. 90 tablet 1   Oxcarbazepine (TRILEPTAL) 300 MG  tablet Take 1 tablet (300 mg total) by mouth 2 (two) times daily. 60 tablet 3   propranolol (INDERAL) 20 MG tablet Take 1 tablet (20 mg total) by mouth 2 (two) times daily as needed for panic,agitation,sleep, and anxiety. 60 tablet 3   QUEtiapine (SEROQUEL) 400 MG tablet Take 2 tablets (800 mg total) by mouth at bedtime. 60 tablet 3   rosuvastatin (CRESTOR) 20 MG tablet Take 1 tablet (20 mg total) by mouth daily. 90 tablet 1   SYRINGE-NEEDLE, DISP, 3 ML 22G X 1-1/2" 3 ML MISC Use a new needle and syringe for each injection. 50 each 0   No current facility-administered medications for this visit.    Musculoskeletal: Strength & Muscle Tone: within normal limits Gait & Station: normal Patient leans: N/A  Psychiatric Specialty Exam: Review of Systems  Psychiatric/Behavioral:  Positive for dysphoric mood. Negative for suicidal ideas.     Blood pressure 132/89, pulse 77, weight 164 lb (74.4 kg).Body mass index is 29.05 kg/m.  General Appearance: Casual facial piercing's on R side of face  Eye Contact:  Good  Speech:  Clear and Coherent  Volume:  Normal  Mood:  Depressed  Affect:  Appropriate  Thought Process:  Goal Directed  Orientation:  Full (Time, Place, and Person)  Thought Content:  Logical  Suicidal Thoughts:  No  Homicidal Thoughts:  No  Memory:  Immediate;   Fair Recent;   Good  Judgement:  Fair  Insight:  Lacking  Psychomotor Activity:  Normal  Concentration:  Concentration: Fair  Recall:  [employer] of Knowledge:Fair  Language: Good  Akathisia:  No  Handed:    AIMS (if indicated):  not done  Assets:  Communication Skills Desire for Improvement Housing Leisure Time Resilience  ADL's:  Intact  Cognition: WNL  Sleep:  Fair   Screenings: CAGE-AID    Flowsheet Row Clinical Support from 08/08/2022 in [place] Score 3      GAD-7    Flowsheet Row Office Visit from 08/01/2022 in [place] [facility]  Visit from 01/10/2022 in [place]  Clinical Support from 07/25/2021 in [place] from 04/18/2021 in [place] from 01/17/2021 in [facility]  Total GAD-7 Score _0 PHQ2-9    [place] Office Visit from 08/01/2022 in [place] Office Visit from 01/10/2022 in [place] from 07/25/2021 in [facility] Clinical Support from 04/18/2021 in [facility] Clinical Support from 01/17/2021 in [place]  PHQ-2 Total Score _1 PHQ-9 Total Score _2 Flowsheet Row Clinical Support from 01/17/2021 in [place] No Risk       Assessment and Plan: Angel Hughes is a 61 yo patient with a PPH of Bipolar disorder and Etoh Use disorder and a PMH of Etoh induced pancreatitis. Patient mood is likley worsened by her Etoh use, which patient appears to be struggling with acepting that she has Etoh use disorder; however because was receptive to provider suggesting that some of her symptoms were related to her Etoh use (mental and physical) she was open to Etoh cessation and interested in naltrexone. Patient Liver enzymes were fine. Patient was not interested in thiamine at this time, despite briefly endorsing memory concerns. Patient did not appear to meet true ciriteria for Bipolar today however, will continue to monitor. Patient regimen should be therapeutic for her symptoms and while the medications may have "pooped out" patient Etoh intake is a significant variable. Patient not interested in stopping THC use nor therapy at this time.  Substance induced mood disorder (Hx of Bipolar disorder?) Etoh use disorder, severe - Start  Naltrexone 20m daily x 14 days then increase to 56mdaily - Patient educated on withdrawal symptoms and FBC  - Continue Cymbalta 6099mID - Decrease Cogentin to 1mg25mily - Continue Trileptal 300mg35m - Continue Seroquel 800mg 51m F/u in 1 mon  Collaboration of Care:   Patient/Guardian was advised Release of Information must be obtained prior to any record release in order to collaborate their care with an outside provider. Patient/Guardian was advised if they have not already done so to contact the registration department to sign all necessary forms in order for us to [place] information regarding their care.   Consent: Patient/Guardian gives verbal consent for treatment and assignment of benefits for services provided during this visit. Patient/Guardian expressed understanding and agreed to proceed.   PGY-3 Giuseppina Quinones B Freida Busman1/9/20234:20 PM

## 2022-08-13 ENCOUNTER — Encounter (INDEPENDENT_AMBULATORY_CARE_PROVIDER_SITE_OTHER): Payer: Self-pay

## 2022-08-30 ENCOUNTER — Telehealth (INDEPENDENT_AMBULATORY_CARE_PROVIDER_SITE_OTHER): Payer: Self-pay

## 2022-08-30 NOTE — Telephone Encounter (Signed)
Pt returned our call. Shared provider's note.  Kerin Perna, NP 08/07/2022  3:20 PM EST     All labs are normal . Kidney/liver function normal , blood . Try to drink at least 48 oz of water per day. Work on eating a low fat, heart healthy diet and participate in regular aerobic exercise program to control as well. Exercise at least  30 minutes per day-5 days per week. Monitor eating red meat, fried foods,  junk foods, sodas, sugary foods or drinks, unhealthy snacking, alcohol or smoking.

## 2022-09-03 ENCOUNTER — Other Ambulatory Visit (HOSPITAL_COMMUNITY): Payer: Self-pay

## 2022-09-03 ENCOUNTER — Other Ambulatory Visit (INDEPENDENT_AMBULATORY_CARE_PROVIDER_SITE_OTHER): Payer: Self-pay | Admitting: Primary Care

## 2022-09-03 ENCOUNTER — Other Ambulatory Visit: Payer: Self-pay

## 2022-09-03 MED ORDER — ROSUVASTATIN CALCIUM 20 MG PO TABS
20.0000 mg | ORAL_TABLET | Freq: Every day | ORAL | 3 refills | Status: DC
  Filled 2022-09-03: qty 30, 30d supply, fill #0
  Filled 2022-10-04 – 2022-12-18 (×2): qty 30, 30d supply, fill #1
  Filled 2023-03-18 – 2023-03-26 (×2): qty 30, 30d supply, fill #2
  Filled 2023-05-21: qty 30, 30d supply, fill #3
  Filled 2023-07-28: qty 30, 30d supply, fill #4
  Filled 2023-09-01: qty 30, 30d supply, fill #5

## 2022-09-03 NOTE — Telephone Encounter (Signed)
Requested Prescriptions  Pending Prescriptions Disp Refills   rosuvastatin (CRESTOR) 20 MG tablet 90 tablet 3    Sig: Take 1 tablet (20 mg total) by mouth daily.     Cardiovascular:  Antilipid - Statins 2 Failed - 09/03/2022  7:52 AM      Failed - Cr in normal range and within 360 days    Creat  Date Value Ref Range Status  06/01/2013 1.07 0.50 - 1.10 mg/dL Final   Creatinine, Ser  Date Value Ref Range Status  08/01/2022 1.07 (H) 0.57 - 1.00 mg/dL Final   Creatinine,U  Date Value Ref Range Status  12/27/2009 162.1 mg/dL Final    Comment:    See lab report for associated comment(s)         Failed - Lipid Panel in normal range within the last 12 months    Cholesterol, Total  Date Value Ref Range Status  08/01/2022 136 100 - 199 mg/dL Final   LDL Chol Calc (NIH)  Date Value Ref Range Status  08/01/2022 68 0 - 99 mg/dL Final   HDL  Date Value Ref Range Status  08/01/2022 49 >39 mg/dL Final   Triglycerides  Date Value Ref Range Status  08/01/2022 104 0 - 149 mg/dL Final         Passed - Patient is not pregnant      Passed - Valid encounter within last 12 months    Recent Outpatient Visits           1 month ago Need for immunization against influenza   [place], Michelle P, NP   4 months ago Essential hypertension   [place] Juluis Mire P, NP   7 months ago Alcohol-induced acute pancreatitis without infection or necrosis   [place] Kerin Perna, NP   1 year ago Tobacco abuse   [place] Kerin Perna, NP   2 years ago Syncope and collapse   [facility] Kerin Perna, NP

## 2022-09-04 ENCOUNTER — Other Ambulatory Visit: Payer: Self-pay

## 2022-10-04 ENCOUNTER — Other Ambulatory Visit: Payer: Self-pay

## 2022-10-10 ENCOUNTER — Other Ambulatory Visit: Payer: Self-pay

## 2022-10-11 ENCOUNTER — Other Ambulatory Visit: Payer: Self-pay

## 2022-11-04 ENCOUNTER — Other Ambulatory Visit: Payer: Self-pay

## 2022-11-08 ENCOUNTER — Other Ambulatory Visit: Payer: Self-pay

## 2022-11-08 ENCOUNTER — Encounter (HOSPITAL_COMMUNITY): Payer: Medicaid Other | Admitting: Student in an Organized Health Care Education/Training Program

## 2022-11-11 ENCOUNTER — Other Ambulatory Visit: Payer: Self-pay

## 2022-11-15 ENCOUNTER — Other Ambulatory Visit: Payer: Self-pay

## 2022-11-19 ENCOUNTER — Ambulatory Visit (INDEPENDENT_AMBULATORY_CARE_PROVIDER_SITE_OTHER): Payer: Medicaid Other | Admitting: Primary Care

## 2022-11-20 ENCOUNTER — Telehealth: Payer: Self-pay | Admitting: Primary Care

## 2022-11-20 NOTE — Telephone Encounter (Signed)
Will forward to provider  

## 2022-11-20 NOTE — Telephone Encounter (Signed)
Copied from [place] [id number]. Topic: General - Other >> Nov 19, 2022  3:47 PM Dominique A wrote: Reason for CRM: Pt is calling to see if PCP can write her a prescription for diabetic shoes, a diabetic testing machine, and a blood pressure machine.  Please advise

## 2022-11-20 NOTE — Telephone Encounter (Signed)
Patient had schedule appt yesterday no show . On her next schedule appt will address

## 2022-11-20 NOTE — Telephone Encounter (Signed)
Contacted pt to go over provider response pt didn't answer and was unable to lvm due to vm being full

## 2022-11-21 ENCOUNTER — Other Ambulatory Visit: Payer: Self-pay

## 2022-12-10 ENCOUNTER — Telehealth (HOSPITAL_COMMUNITY): Payer: Self-pay | Admitting: Student in an Organized Health Care Education/Training Program

## 2022-12-10 NOTE — Telephone Encounter (Signed)
Pt left 2 vm's today 3:03 & 3:12 I returned pt cll at 4:04 pt did not ans. LVM for her to call us back. Thanks 12/10/22 AL

## 2022-12-18 ENCOUNTER — Other Ambulatory Visit: Payer: Self-pay

## 2022-12-27 ENCOUNTER — Encounter (HOSPITAL_COMMUNITY): Payer: Medicaid Other | Admitting: Student in an Organized Health Care Education/Training Program

## 2022-12-30 ENCOUNTER — Other Ambulatory Visit: Payer: Self-pay

## 2023-01-31 ENCOUNTER — Other Ambulatory Visit (HOSPITAL_COMMUNITY): Payer: Self-pay

## 2023-01-31 ENCOUNTER — Telehealth (HOSPITAL_COMMUNITY): Payer: Self-pay | Admitting: Student in an Organized Health Care Education/Training Program

## 2023-01-31 ENCOUNTER — Other Ambulatory Visit: Payer: Self-pay

## 2023-02-04 ENCOUNTER — Other Ambulatory Visit (HOSPITAL_COMMUNITY): Payer: Self-pay | Admitting: Student in an Organized Health Care Education/Training Program

## 2023-02-04 ENCOUNTER — Other Ambulatory Visit: Payer: Self-pay

## 2023-02-04 DIAGNOSIS — F319 Bipolar disorder, unspecified: Secondary | ICD-10-CM

## 2023-02-04 MED ORDER — QUETIAPINE FUMARATE 400 MG PO TABS
800.0000 mg | ORAL_TABLET | Freq: Every day | ORAL | 3 refills | Status: DC
  Filled 2023-02-04: qty 60, 30d supply, fill #0
  Filled 2023-03-11: qty 60, 30d supply, fill #1

## 2023-02-04 NOTE — Telephone Encounter (Signed)
Done

## 2023-02-05 ENCOUNTER — Other Ambulatory Visit: Payer: Self-pay

## 2023-02-07 ENCOUNTER — Encounter (HOSPITAL_COMMUNITY): Payer: Medicaid Other | Admitting: Student in an Organized Health Care Education/Training Program

## 2023-02-10 ENCOUNTER — Encounter (HOSPITAL_COMMUNITY): Payer: Medicaid Other | Admitting: Student in an Organized Health Care Education/Training Program

## 2023-02-10 NOTE — Progress Notes (Deleted)
BH MD/PA/NP OP Progress Note  02/10/2023 12:38 PM Angel Hughes  MRN:  [id number]  Chief Complaint: No chief complaint on file.  HPI: Angel Hughes is a 62 yo patient with a PPH of Bipolar disorder and Etoh Use disorder and a PMH of Etoh induced pancreatitis. Patient is prescribed the following medications:   Cogentin 1mg  daily Cymbalta 60mg  BID Trileptal 300mg  BID Seroquel 800mg  QHS Naltrexone 50mg  daily   Etoh intake THC   Visit Diagnosis: No diagnosis found.  Past Psychiatric History:  Dx-Bipolar dx INPT: Negative OPT: Dr. Doyne Keel and Vesta Mixer Previous meds: Prozac, Paxil, Geodon NO SA   07/2022-struggling with acepting that she has Etoh use disorder; however because was receptive to provider suggesting that some of her symptoms were related to her Etoh use (mental and physical) she was open to Etoh cessation and interested in naltrexone. Patient mood is likley worsened by her Etoh use.Patient did not appear to meet true ciriteria for Bipolar today however, will continue to monitor. Patient not interested in stopping THC use nor therapy at this time.   Past Medical History:  Past Medical History:  Diagnosis Date   Allergic rhinitis    Anemia    Anxiety    Arthritis    Bipolar disorder (HCC)    Depression    GERD (gastroesophageal reflux disease)    Hyperlipemia    Hypertension    pt states that she has no hx of elevated BP, on med for hot flashes   Obesity     Past Surgical History:  Procedure Laterality Date   ABDOMINAL HYSTERECTOMY     CESAREAN SECTION     WRIST SURGERY     cyst   Z-PLASTY REPAIR      Family Psychiatric History: ***  Family History:  Family History  Problem Relation Age of Onset   Diabetes Paternal Grandmother    Hypertension Paternal Grandmother    Diabetes Maternal Grandmother    Hypertension Maternal Grandmother    Diabetes Mother    Hypertension Mother    Diabetes Brother    Diabetes Sister    Heart disease Sister     Hypertension Sister     Social History:  Social History   Socioeconomic History   Marital status: Legally Separated    Spouse name: Not on file   Number of children: Not on file   Years of education: Not on file   Highest education level: Not on file  Occupational History   Not on file  Tobacco Use   Smoking status: Every Day    Packs/day: 0.50    Years: 34.00    Additional pack years: 0.00    Total pack years: 17.00    Types: Cigarettes   Smokeless tobacco: Never  Vaping Use   Vaping Use: Never used  Substance and Sexual Activity   Alcohol use: Yes    Alcohol/week: 0.0 standard drinks of alcohol    Comment: occassionally   Drug use: No   Sexual activity: Not on file  Other Topics Concern   Not on file  Social History Narrative   Right handed    Lives alone    Social Determinants of Health   Financial Resource Strain: Not on file  Food Insecurity: Not on file  Transportation Needs: Not on file  Physical Activity: Not on file  Stress: Not on file  Social Connections: Not on file    Allergies: No Known Allergies  Metabolic Disorder Labs: Lab Results  Component Value Date  HGBA1C 6.9 (A) 04/11/2022   MPG 126 (H) 06/01/2013   No results found for: "PROLACTIN" Lab Results  Component Value Date   CHOL 136 08/01/2022   TRIG 104 08/01/2022   HDL 49 08/01/2022   CHOLHDL 2.8 08/01/2022   VLDL 27 06/01/2013   LDLCALC 68 08/01/2022   LDLCALC 113 (H) 01/10/2022   Lab Results  Component Value Date   TSH 0.86 10/23/2020   TSH 0.694 02/19/2010    Therapeutic Level Labs: No results found for: "LITHIUM" No results found for: "VALPROATE" No results found for: "CBMZ"  Current Medications: Current Outpatient Medications  Medication Sig Dispense Refill   amLODipine (NORVASC) 10 MG tablet Take 1 tablet (10 mg total) by mouth daily. 90 tablet 1   BD [employer] Home MISC Place used needles and syringes in sharps container 1 each 0   benztropine  (COGENTIN) 1 MG tablet Take 1 tablet (1 mg total) by mouth daily. 30 tablet 3   Cyanocobalamin (VITAMIN DEFICIENCY SYSTEM-B12) 1000 MCG/ML KIT 1000 mcg daily for 1 week, then weekly for 1 month, then monthly for a year 1 kit 0   DULoxetine (CYMBALTA) 60 MG capsule Take 1 capsule (60 mg total) by mouth 2 (two) times daily. 60 capsule 3   hydrOXYzine (VISTARIL) 50 MG capsule Take 1 capsule (50 mg total) by mouth 3 (three) times daily as needed. 120 capsule 3   metFORMIN (GLUCOPHAGE-XR) 500 MG 24 hr tablet Take 1 tablet (500 mg total) by mouth daily with breakfast. 90 tablet 1   naltrexone (DEPADE) 50 MG tablet Take 0.5 tablets (25 mg total) by mouth daily. 7 tablet 0   naltrexone (DEPADE) 50 MG tablet Take 1 tablet (50 mg total) by mouth daily. 30 tablet 1   Oxcarbazepine (TRILEPTAL) 300 MG tablet Take 1 tablet (300 mg total) by mouth 2 (two) times daily. 60 tablet 3   propranolol (INDERAL) 20 MG tablet Take 1 tablet (20 mg total) by mouth 2 (two) times daily as needed for panic,agitation,sleep, and anxiety. 60 tablet 3   QUEtiapine (SEROQUEL) 400 MG tablet Take 2 tablets (800 mg total) by mouth at bedtime. 60 tablet 3   rosuvastatin (CRESTOR) 20 MG tablet Take 1 tablet (20 mg total) by mouth daily. 90 tablet 3   SYRINGE-NEEDLE, DISP, 3 ML 22G X 1-1/2" 3 ML MISC Use a new needle and syringe for each injection. 50 each 0   No current facility-administered medications for this visit.     Musculoskeletal: Strength & Muscle Tone: {desc; muscle tone:32375} Gait & Station: {PE GAIT ED UJWJ:19147} Patient leans: {Patient Leans:21022755}  Psychiatric Specialty Exam: Review of Systems  There were no vitals taken for this visit.There is no height or weight on file to calculate BMI.  General Appearance: {Appearance:22683}  Eye Contact:  {BHH EYE CONTACT:22684}  Speech:  {Speech:22685}  Volume:  {Volume (PAA):22686}  Mood:  {BHH MOOD:22306}  Affect:  {Affect (PAA):22687}  Thought Process:  {Thought  Process (PAA):22688}  Orientation:  {BHH ORIENTATION (PAA):22689}  Thought Content: {Thought Content:22690}   Suicidal Thoughts:  {ST/HT (PAA):22692}  Homicidal Thoughts:  {ST/HT (PAA):22692}  Memory:  {BHH MEMORY:22881}  Judgement:  {Judgement (PAA):22694}  Insight:  {Insight (PAA):22695}  Psychomotor Activity:  {Psychomotor (PAA):22696}  Concentration:  {Concentration:21399}  Recall:  {BHH GOOD/FAIR/POOR:22877}  Fund of Knowledge: {BHH GOOD/FAIR/POOR:22877}  Language: {BHH GOOD/FAIR/POOR:22877}  Akathisia:  {BHH YES OR NO:22294}  Handed:  {Handed:22697}  AIMS (if indicated): {Desc; done/not:10129}  Assets:  {Assets (PAA):22698}  ADL's:  {BHH  ZOX'W:96045}  Cognition: {chl bhh cognition:304700322}  Sleep:  {BHH GOOD/FAIR/POOR:22877}   Screenings: CAGE-AID    Flowsheet Row Clinical Support from 08/08/2022 in [facility]  CAGE-AID Score 3      GAD-7    Flowsheet Row Office Visit from 08/01/2022 in [facility] Office Visit from 01/10/2022 in [facility] Clinical Support from 07/25/2021 in [facility] Clinical Support from 04/18/2021 in [facility] Clinical Support from 01/17/2021 in [facility]  Total GAD-7 Score 12 15 15 18 11       PHQ2-9    Flowsheet Row Office Visit from 08/01/2022 in [facility] Office Visit from 01/10/2022 in [facility] Clinical Support from 07/25/2021 in [facility] Clinical Support from 04/18/2021 in [facility] Clinical Support from 01/17/2021 in [facility]  PHQ-2 Total Score 4 5 2 6 4   PHQ-9 Total Score 13 15 10 19 11       Flowsheet Row Clinical Support from 01/17/2021 in [facility]  C-SSRS RISK CATEGORY No Risk         Assessment and Plan: ***  Collaboration of Care: Collaboration of Care: [facility] OP Collaboration of WUJW:[id number]}  Patient/Guardian was advised Release of Information must be obtained prior to any record release in order to collaborate their care with an outside provider. Patient/Guardian was advised if they have not already done so to contact the registration department to sign all necessary forms in order for [place] to release information regarding their care.   Consent: Patient/Guardian gives verbal consent for treatment and assignment of benefits for services provided during this visit. Patient/Guardian expressed understanding and agreed to proceed.    Bobbye Morton, MD 02/10/2023, 12:38 PM

## 2023-03-05 ENCOUNTER — Other Ambulatory Visit: Payer: Self-pay

## 2023-03-11 ENCOUNTER — Other Ambulatory Visit (HOSPITAL_COMMUNITY): Payer: Self-pay

## 2023-03-11 ENCOUNTER — Other Ambulatory Visit: Payer: Self-pay

## 2023-03-13 ENCOUNTER — Ambulatory Visit (INDEPENDENT_AMBULATORY_CARE_PROVIDER_SITE_OTHER): Payer: Medicaid Other | Admitting: Psychiatry

## 2023-03-13 ENCOUNTER — Encounter (HOSPITAL_COMMUNITY): Payer: Self-pay | Admitting: Psychiatry

## 2023-03-13 ENCOUNTER — Other Ambulatory Visit: Payer: Self-pay

## 2023-03-13 DIAGNOSIS — F411 Generalized anxiety disorder: Secondary | ICD-10-CM | POA: Diagnosis not present

## 2023-03-13 DIAGNOSIS — F319 Bipolar disorder, unspecified: Secondary | ICD-10-CM

## 2023-03-13 DIAGNOSIS — F1020 Alcohol dependence, uncomplicated: Secondary | ICD-10-CM

## 2023-03-13 MED ORDER — DULOXETINE HCL 60 MG PO CPEP
60.0000 mg | ORAL_CAPSULE | Freq: Two times a day (BID) | ORAL | 3 refills | Status: DC
  Filled 2023-03-13 – 2023-03-26 (×2): qty 60, 30d supply, fill #0
  Filled 2023-05-21: qty 60, 30d supply, fill #1

## 2023-03-13 MED ORDER — OXCARBAZEPINE 300 MG PO TABS
300.0000 mg | ORAL_TABLET | Freq: Two times a day (BID) | ORAL | 3 refills | Status: DC
  Filled 2023-03-13 – 2023-03-26 (×2): qty 60, 30d supply, fill #0
  Filled 2023-05-21: qty 60, 30d supply, fill #1

## 2023-03-13 MED ORDER — NALTREXONE HCL 50 MG PO TABS
50.0000 mg | ORAL_TABLET | Freq: Every day | ORAL | 3 refills | Status: DC
  Filled 2023-03-13 – 2023-03-26 (×2): qty 30, 30d supply, fill #0
  Filled 2023-05-21: qty 30, 30d supply, fill #1

## 2023-03-13 MED ORDER — BENZTROPINE MESYLATE 1 MG PO TABS
1.0000 mg | ORAL_TABLET | Freq: Every day | ORAL | 3 refills | Status: DC
  Filled 2023-03-13 – 2023-03-26 (×2): qty 30, 30d supply, fill #0
  Filled 2023-05-21: qty 30, 30d supply, fill #1

## 2023-03-13 MED ORDER — QUETIAPINE FUMARATE 400 MG PO TABS
800.0000 mg | ORAL_TABLET | Freq: Every day | ORAL | 3 refills | Status: DC
  Filled 2023-03-13 – 2023-04-16 (×3): qty 60, 30d supply, fill #0
  Filled 2023-05-21: qty 60, 30d supply, fill #1
  Filled 2023-06-24: qty 60, 30d supply, fill #2

## 2023-03-13 MED ORDER — HYDROXYZINE PAMOATE 50 MG PO CAPS
50.0000 mg | ORAL_CAPSULE | Freq: Three times a day (TID) | ORAL | 3 refills | Status: DC | PRN
  Filled 2023-03-13 – 2023-03-26 (×2): qty 90, 30d supply, fill #0
  Filled 2023-05-21: qty 90, 30d supply, fill #1
  Filled 2023-06-24: qty 90, 30d supply, fill #2

## 2023-03-13 NOTE — Progress Notes (Signed)
BH MD/PA/NP OP Progress Note  03/13/2023 12:41 PM Angel Hughes  MRN:  [id number]  Chief Complaint: "I found my son deceased"    HPI: 62 year old female seen today for follow up psychiatric evaluation.  She has a psychiatric history of  bipolar disorder and anxiety. She is currently being managed on Seroquel 800 mg HS, Trileptal 300 mg twice daily, hydroxyzine 50 mg four times daily, Cogentin 1 mg twice daily, and Cymbalta 60 mg twice daily.  Patient informed Clinical research associate that she take her medications periodontically.   Today patient is well-groomed, pleasant, cooperative, engaged in conversation.  She is tearful throughout the exam.  She informed Clinical research associate that three month ago she found her son deceased in his bed.  Patient informed Clinical research associate that she continues to grieve his loss.  She notes that she lays in his bed with his dog and reminisces on him.  Patient informed Clinical research associate that she has no one left.  She reports that her mother is still alive but has dementia.  Her husband passed away in her arms a few years ago.  Patient informed Clinical research associate that she takes her medication periodically and notes that at this time she has been more anxious and depressed.  She notes that she copes by drinking alcohol daily.  Patient was given naltrexone by Dr. Morrie Sheldon however she informed writer that her alcohol numbs her pain.  At this time she reports that she is not interested in substance use treatment or therapy.  Provider encouraged patient to take naltrexone to help manage cravings.  She endorsed understanding and she agreed.    Patient informed Clinical research associate that she is having to move out of the home that she and her son living together as financially she can no longer maintain it.  She informed Clinical research associate that she and he shared the bills and notes that she would deposit her checks into his bank account.  Now that he is deceased she reports that she does not have access to his accounts and notes that it has been a battle to obtain his  assets.  Patient informed Clinical research associate that he potentially has an 77 year old son but is uncertain if this child is her grandson.  She inform her that she is thinking about doing the DNA testing.  Patient notes that the above exacerbates her anxiety and depression.  Today provider conducted GAD-7 and patient scored a 16.  Provider also conducted PHQ-9 and patient scored a 16.  She endorses poor appetite.  Patient notes that she sleeps 10 to 12 hours nightly.  Today she denies SI/HI/VH, mania, paranoia.  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 She notes that she has not followed up with her pcp to manage this. She also notes that her memory continues to be poor. Provider informed patient that alcohol can cause poor memory. She endorsed understanding and agreed. Provider recommended that she follow up with her PCP  At this time patient notes that she is willing to restart her medications. She reports that she will take naltrexone more regularly.  At this time she does not wish to start therapy.  No medication changes made today.  Today she is agreeable to continuing medications as prescribed. No other concerns at this time   Visit Diagnosis:    ICD-10-CM   1. Bipolar disorder with depression (HCC)  F31.9 benztropine (COGENTIN) 1 MG tablet    DULoxetine (CYMBALTA) 60 MG capsule    Oxcarbazepine (TRILEPTAL) 300 MG tablet  QUEtiapine (SEROQUEL) 400 MG tablet    2. Generalized anxiety disorder  F41.1 DULoxetine (CYMBALTA) 60 MG capsule    hydrOXYzine (VISTARIL) 50 MG capsule    3. Alcohol use disorder, severe, dependence (HCC)  F10.20 naltrexone (DEPADE) 50 MG tablet      Past Psychiatric History: bipolar disorder and anxiety  Past Medical History:  Past Medical History:  Diagnosis Date   Allergic rhinitis    Anemia    Anxiety    Arthritis    Bipolar disorder (HCC)    Depression    GERD (gastroesophageal reflux disease)    Hyperlipemia    Hypertension     pt states that she has no hx of elevated BP, on med for hot flashes   Obesity     Past Surgical History:  Procedure Laterality Date   ABDOMINAL HYSTERECTOMY     CESAREAN SECTION     WRIST SURGERY     cyst   Z-PLASTY REPAIR      Family Psychiatric History: Mother dementia Family History:  Family History  Problem Relation Age of Onset   Diabetes Paternal Grandmother    Hypertension Paternal Grandmother    Diabetes Maternal Grandmother    Hypertension Maternal Grandmother    Diabetes Mother    Hypertension Mother    Diabetes Brother    Diabetes Sister    Heart disease Sister    Hypertension Sister     Social History:  Social History   Socioeconomic History   Marital status: Legally Separated    Spouse name: Not on file   Number of children: Not on file   Years of education: Not on file   Highest education level: Not on file  Occupational History   Not on file  Tobacco Use   Smoking status: Every Day    Packs/day: 0.50    Years: 34.00    Additional pack years: 0.00    Total pack years: 17.00    Types: Cigarettes   Smokeless tobacco: Never  Vaping Use   Vaping Use: Never used  Substance and Sexual Activity   Alcohol use: Yes    Alcohol/week: 0.0 standard drinks of alcohol    Comment: occassionally   Drug use: No   Sexual activity: Not on file  Other Topics Concern   Not on file  Social History Narrative   Right handed    Lives alone    Social Determinants of Health   Financial Resource Strain: Not on file  Food Insecurity: Not on file  Transportation Needs: Not on file  Physical Activity: Not on file  Stress: Not on file  Social Connections: Not on file    Allergies: No Known Allergies  Metabolic Disorder Labs: Lab Results  Component Value Date   HGBA1C 6.9 (A) 04/11/2022   MPG 126 (H) 06/01/2013   No results found for: "PROLACTIN" Lab Results  Component Value Date   CHOL 136 08/01/2022   TRIG 104 08/01/2022   HDL 49 08/01/2022   CHOLHDL  2.8 08/01/2022   VLDL 27 06/01/2013   LDLCALC 68 08/01/2022   LDLCALC 113 (H) 01/10/2022   Lab Results  Component Value Date   TSH 0.86 10/23/2020   TSH 0.694 02/19/2010    Therapeutic Level Labs: No results found for: "LITHIUM" No results found for: "VALPROATE" No results found for: "CBMZ"  Current Medications: Current Outpatient Medications  Medication Sig Dispense Refill   amLODipine (NORVASC) 10 MG tablet Take 1 tablet (10 mg total) by mouth daily. 90  tablet 1   BD [employer] MISC Place used needles and syringes in sharps container 1 each 0   benztropine (COGENTIN) 1 MG tablet Take 1 tablet (1 mg total) by mouth daily. 30 tablet 3   Cyanocobalamin (VITAMIN DEFICIENCY SYSTEM-B12) 1000 MCG/ML KIT 1000 mcg daily for 1 week, then weekly for 1 month, then monthly for a year 1 kit 0   DULoxetine (CYMBALTA) 60 MG capsule Take 1 capsule (60 mg total) by mouth 2 (two) times daily. 60 capsule 3   hydrOXYzine (VISTARIL) 50 MG capsule Take 1 capsule (50 mg total) by mouth 3 (three) times daily as needed. 120 capsule 3   metFORMIN (GLUCOPHAGE-XR) 500 MG 24 hr tablet Take 1 tablet (500 mg total) by mouth daily with breakfast. 90 tablet 1   naltrexone (DEPADE) 50 MG tablet Take 0.5 tablets (25 mg total) by mouth daily. 7 tablet 0   naltrexone (DEPADE) 50 MG tablet Take 1 tablet (50 mg total) by mouth daily. 30 tablet 3   Oxcarbazepine (TRILEPTAL) 300 MG tablet Take 1 tablet (300 mg total) by mouth 2 (two) times daily. 60 tablet 3   propranolol (INDERAL) 20 MG tablet Take 1 tablet (20 mg total) by mouth 2 (two) times daily as needed for panic,agitation,sleep, and anxiety. 60 tablet 3   QUEtiapine (SEROQUEL) 400 MG tablet Take 2 tablets (800 mg total) by mouth at bedtime. 60 tablet 3   rosuvastatin (CRESTOR) 20 MG tablet Take 1 tablet (20 mg total) by mouth daily. 90 tablet 3   SYRINGE-NEEDLE, DISP, 3 ML 22G X 1-1/2" 3 ML MISC Use a new needle and syringe for each injection. 50 each 0    No current facility-administered medications for this visit.     Musculoskeletal: Strength & Muscle Tone: within normal limits Gait & Station: normal Patient leans: N/A  Psychiatric Specialty Exam: Review of Systems  Blood pressure 129/84, pulse 97, resp. rate 16, height 5\' 3"  (1.6 m), weight 162 lb (73.5 kg), SpO2 99 %.Body mass index is 28.7 kg/m.  General Appearance: Well Groomed  Eye Contact:  Good  Speech:  Clear and Coherent and Normal Rate  Volume:  Normal  Mood:  Anxious and Depressed  Affect:  Appropriate and Congruent  Thought Process:  Coherent, Goal Directed and Linear  Orientation:  Full (Time, Place, and Person)  Thought Content: WDL and Logical   Suicidal Thoughts:  No  Homicidal Thoughts:  No  Memory:  Immediate;   Good Recent;   Good Remote;   Good  Judgement:  Good  Insight:  Good  Psychomotor Activity:  Normal  Concentration:  Concentration: Good and Attention Span: Good  Recall:  Good  Fund of Knowledge: Good  Language: Good  Akathisia:  No  Handed:  Right  AIMS (if indicated): Not done  Assets:  Communication Skills Desire for Improvement Housing  ADL's:  Intact  Cognition: WNL  Sleep:  Good   Screenings: CAGE-AID    Flowsheet Row Clinical Support from 08/08/2022 in [facility]  CAGE-AID Score 3      GAD-7    Flowsheet Row Clinical Support from 03/13/2023 in [facility] Visit from 08/01/2022 in [facility] Office Visit from 01/10/2022 in [facility] Clinical Support from 07/25/2021 in [facility] Clinical Support from 04/18/2021 in [facility]  Total GAD-7 Score 16 12 15 15  18  PHQ2-9    Flowsheet Row Clinical Support from 03/13/2023 in [facility] Office Visit from 08/01/2022 in [facility]  Visit from 01/10/2022 in [facility] Clinical Support from 07/25/2021 in [facility] Clinical Support from 04/18/2021 in [facility]  PHQ-2 Total Score 5 4 5 2 6   PHQ-9 Total Score 16 13 15 10 19       Flowsheet Row Clinical Support from 01/17/2021 in [facility]  C-SSRS RISK CATEGORY No Risk        Assessment and Plan: Patient endorses symptoms of alcohol use, anxiety, depression, and grief . She informed Clinical research associate that she takes her medications infrequently. At this time patient notes that she is willing to restart her medications. She reports that she will take naltrexone more regularly.  At this time she does not wish to start therapy.  No medication changes made today.  Today she is agreeable to continuing medications as prescribed.   1. Bipolar disorder with depression (HCC)  Restart- benztropine (COGENTIN) 1 MG tablet; Take 1 tablet (1 mg total) by mouth daily.  Dispense: 30 tablet; Refill: 3 - DULoxetine (CYMBALTA) 60 MG capsule; Take 1 capsule (60 mg total) by mouth 2 (two) times daily.  Dispense: 60 capsule; Refill: 3 Restart- Oxcarbazepine (TRILEPTAL) 300 MG tablet; Take 1 tablet (300 mg total) by mouth 2 (two) times daily.  Dispense: 60 tablet; Refill: 3 Restart- QUEtiapine (SEROQUEL) 400 MG tablet; Take 2 tablets (800 mg total) by mouth at bedtime.  Dispense: 60 tablet; Refill: 3  2. Generalized anxiety disorder  Restart- DULoxetine (CYMBALTA) 60 MG capsule; Take 1 capsule (60 mg total) by mouth 2 (two) times daily.  Dispense: 60 capsule; Refill: 3 Restart- hydrOXYzine (VISTARIL) 50 MG capsule; Take 1 capsule (50 mg total) by mouth 3 (three) times daily as needed.  Dispense: 120 capsule; Refill: 3  3. Alcohol use disorder, severe, dependence (HCC)  Restart- naltrexone (DEPADE) 50 MG tablet; Take 1 tablet (50 mg total) by mouth daily.  Dispense: 30 tablet; Refill: 3     Follow up in 1 months   Shanna Cisco, NP 03/13/2023, 12:41 PM

## 2023-03-18 ENCOUNTER — Other Ambulatory Visit: Payer: Self-pay

## 2023-03-20 ENCOUNTER — Other Ambulatory Visit: Payer: Self-pay

## 2023-03-25 ENCOUNTER — Other Ambulatory Visit: Payer: Self-pay

## 2023-03-26 ENCOUNTER — Other Ambulatory Visit: Payer: Self-pay

## 2023-04-08 ENCOUNTER — Telehealth (INDEPENDENT_AMBULATORY_CARE_PROVIDER_SITE_OTHER): Payer: Self-pay

## 2023-04-08 NOTE — Telephone Encounter (Signed)
Chart review completed for patient. Patient is due for screening mammogram. Patient will call back to schedule. Angel Hughes, Population Health Specialist.

## 2023-04-16 ENCOUNTER — Other Ambulatory Visit: Payer: Self-pay

## 2023-04-25 ENCOUNTER — Encounter (HOSPITAL_COMMUNITY): Payer: Medicaid Other | Admitting: Student in an Organized Health Care Education/Training Program

## 2023-05-02 ENCOUNTER — Encounter (HOSPITAL_COMMUNITY): Payer: Medicaid Other | Admitting: Student in an Organized Health Care Education/Training Program

## 2023-05-21 ENCOUNTER — Other Ambulatory Visit (INDEPENDENT_AMBULATORY_CARE_PROVIDER_SITE_OTHER): Payer: Self-pay

## 2023-05-21 ENCOUNTER — Other Ambulatory Visit: Payer: Self-pay

## 2023-05-21 ENCOUNTER — Other Ambulatory Visit (INDEPENDENT_AMBULATORY_CARE_PROVIDER_SITE_OTHER): Payer: Self-pay | Admitting: Primary Care

## 2023-05-21 DIAGNOSIS — I10 Essential (primary) hypertension: Secondary | ICD-10-CM

## 2023-05-21 MED ORDER — AMLODIPINE BESYLATE 10 MG PO TABS
10.0000 mg | ORAL_TABLET | Freq: Every day | ORAL | 0 refills | Status: DC
Start: 2023-05-21 — End: 2023-10-07
  Filled 2023-05-21 – 2023-05-22 (×2): qty 30, 30d supply, fill #0

## 2023-05-22 ENCOUNTER — Other Ambulatory Visit: Payer: Self-pay

## 2023-05-23 ENCOUNTER — Other Ambulatory Visit: Payer: Self-pay

## 2023-06-20 ENCOUNTER — Encounter (HOSPITAL_COMMUNITY): Payer: Medicaid Other | Admitting: Student in an Organized Health Care Education/Training Program

## 2023-06-23 ENCOUNTER — Telehealth (INDEPENDENT_AMBULATORY_CARE_PROVIDER_SITE_OTHER): Payer: Self-pay | Admitting: Primary Care

## 2023-06-23 NOTE — Telephone Encounter (Signed)
Spoke to pt and she will be at apt on 9/24

## 2023-06-24 ENCOUNTER — Encounter (INDEPENDENT_AMBULATORY_CARE_PROVIDER_SITE_OTHER): Payer: Self-pay | Admitting: Primary Care

## 2023-06-24 ENCOUNTER — Ambulatory Visit (INDEPENDENT_AMBULATORY_CARE_PROVIDER_SITE_OTHER): Payer: Medicaid Other | Admitting: Primary Care

## 2023-06-24 ENCOUNTER — Other Ambulatory Visit: Payer: Self-pay

## 2023-06-24 VITALS — BP 121/81 | HR 99 | Resp 16 | Ht 63.0 in | Wt 166.2 lb

## 2023-06-24 DIAGNOSIS — E782 Mixed hyperlipidemia: Secondary | ICD-10-CM | POA: Diagnosis not present

## 2023-06-24 DIAGNOSIS — Z7984 Long term (current) use of oral hypoglycemic drugs: Secondary | ICD-10-CM

## 2023-06-24 DIAGNOSIS — I10 Essential (primary) hypertension: Secondary | ICD-10-CM | POA: Diagnosis not present

## 2023-06-24 DIAGNOSIS — E559 Vitamin D deficiency, unspecified: Secondary | ICD-10-CM

## 2023-06-24 DIAGNOSIS — Z1211 Encounter for screening for malignant neoplasm of colon: Secondary | ICD-10-CM

## 2023-06-24 DIAGNOSIS — E538 Deficiency of other specified B group vitamins: Secondary | ICD-10-CM

## 2023-06-24 DIAGNOSIS — M6740 Ganglion, unspecified site: Secondary | ICD-10-CM

## 2023-06-24 DIAGNOSIS — Z1231 Encounter for screening mammogram for malignant neoplasm of breast: Secondary | ICD-10-CM | POA: Diagnosis not present

## 2023-06-24 DIAGNOSIS — E119 Type 2 diabetes mellitus without complications: Secondary | ICD-10-CM

## 2023-06-24 LAB — POCT GLYCOSYLATED HEMOGLOBIN (HGB A1C): HbA1c, POC (controlled diabetic range): 6.9 % (ref 0.0–7.0)

## 2023-06-24 NOTE — Progress Notes (Signed)
[facility]  Angel Hughes, is a 62 y.o. female  VWU:[id number]  GNF:[id number] and cyst  DOB - July 02, 1961  Chief Complaint  Patient presents with   Hypertension   Referral    Cyst on b/l wrist    Hot Flashes       Subjective:   Angel Hughes is a 62 y.o. female here today for a follow up visit hypertension patient has No headache, No chest pain, No abdominal pain - No Nausea, No new weakness tingling or numbness, No Cough - shortness of breath.  Type 2 diabetes Denies polyuria, polydipsia, polyphasia or vision changes.  Does not check blood sugars at home..  She does complain of hot flashes and has already tried black cohosh she has had a total hysterectomy.  Also bilateral cysts on her wrist.  She has had surgery per patient for carpal tunnel in the past.  No problems updated.  No Known Allergies  Past Medical History:  Diagnosis Date   Allergic rhinitis    Anemia    Anxiety    Arthritis    Bipolar disorder (HCC)    Depression    GERD (gastroesophageal reflux disease)    Hyperlipemia    Hypertension    pt states that she has no hx of elevated BP, on med for hot flashes   Obesity     Current Outpatient Medications on File Prior to Visit  Medication Sig Dispense Refill   amLODipine (NORVASC) 10 MG tablet Take 1 tablet (10 mg total) by mouth daily. 30 tablet 0   BD [employer] MISC Place used needles and syringes in sharps container 1 each 0   benztropine (COGENTIN) 1 MG tablet Take 1 tablet (1 mg total) by mouth daily. 30 tablet 3   Cyanocobalamin (VITAMIN DEFICIENCY SYSTEM-B12) 1000 MCG/ML KIT 1000 mcg daily for 1 week, then weekly for 1 month, then monthly for a year 1 kit 0   DULoxetine (CYMBALTA) 60 MG capsule Take 1 capsule (60 mg total) by mouth 2 (two) times daily. 60 capsule 3   hydrOXYzine (VISTARIL) 50 MG capsule Take 1 capsule (50 mg total) by mouth 3 (three) times daily as needed. 120 capsule 3   metFORMIN (GLUCOPHAGE-XR) 500 MG 24  hr tablet Take 1 tablet (500 mg total) by mouth daily with breakfast. 90 tablet 1   naltrexone (DEPADE) 50 MG tablet Take 0.5 tablets (25 mg total) by mouth daily. 7 tablet 0   naltrexone (DEPADE) 50 MG tablet Take 1 tablet (50 mg total) by mouth daily. 30 tablet 3   Oxcarbazepine (TRILEPTAL) 300 MG tablet Take 1 tablet (300 mg total) by mouth 2 (two) times daily. 60 tablet 3   propranolol (INDERAL) 20 MG tablet Take 1 tablet (20 mg total) by mouth 2 (two) times daily as needed for panic,agitation,sleep, and anxiety. 60 tablet 3   QUEtiapine (SEROQUEL) 400 MG tablet Take 2 tablets (800 mg total) by mouth at bedtime. 60 tablet 3   rosuvastatin (CRESTOR) 20 MG tablet Take 1 tablet (20 mg total) by mouth daily. 90 tablet 3   SYRINGE-NEEDLE, DISP, 3 ML 22G X 1-1/2" 3 ML MISC Use a new needle and syringe for each injection. 50 each 0   No current facility-administered medications on file prior to visit.    Objective:   Vitals:   06/24/23 1051 06/24/23 1055  BP: (!) 137/90 121/81  Pulse: 99   Resp: 16   SpO2: 100%   Weight: 166 lb 3.2 oz (75.4  kg)   Height: 5\' 3"  (1.6 m)     Comprehensive ROS Pertinent positive and negative noted in HPI   Exam General appearance : Awake, alert, not in any distress. Speech Clear. Not toxic looking HEENT: Atraumatic and Normocephalic, pupils equally reactive to light and accomodation Neck: Supple, no JVD. No cervical lymphadenopathy.  Chest: Good air entry bilaterally, no added sounds  CVS: S1 S2 regular, no murmurs.  Abdomen: Bowel sounds present, Non tender and not distended with no gaurding, rigidity or rebound. Extremities: B/L Lower Ext shows no edema, both legs are warm to touch Neurology: Awake alert, and oriented X 3, CN II-XII intact, Non focal Skin: No Rash  Data Review Lab Results  Component Value Date   HGBA1C 6.9 06/24/2023   HGBA1C 6.9 (A) 04/11/2022   HGBA1C 6.3 (A) 08/14/2021    Assessment & Plan  Angel Hughes was seen today for  hypertension, referral and hot flashes.  Diagnoses and all orders for this visit:  Type 2 diabetes mellitus without complication, without long-term current use of insulin (HCC) -     POCT glycosylated hemoglobin (Hb A1C) -     CBC with Differential/Platelet -     CMP14+EGFR -     Lipid panel  Essential hypertension Blood pressure is well-controlled, monitoring  her sodium intake -     CBC with Differential/Platelet -     CMP14+EGFR  Mixed hyperlipidemia History of elevated cholesterol on statin -     Lipid panel  Encounter for screening mammogram for malignant neoplasm of breast Referral made for mammograms  B12 deficiency -     Vitamin B12  Vitamin D deficiency -     Vitamin D, 25-hydroxy  Colon cancer screening -     Ambulatory referral to Gastroenterology  Ganglion cyst  Bilateral wrist refer to hand specialist   Patient have been counseled extensively about nutrition and exercise. Other issues discussed during this visit include: low cholesterol diet, weight control and daily exercise, foot care, annual eye examinations at Ophthalmology, importance of adherence with medications and regular follow-up. We also discussed long term complications of uncontrolled diabetes and hypertension.   No follow-ups on file.  The patient was given clear instructions to go to ER or return to medical center if symptoms don't improve, worsen or new problems develop. The patient verbalized understanding. The patient was told to call to get lab results if they haven't heard anything in the next week.   This note has been created with Education officer, environmental. Any transcriptional errors are unintentional.   Grayce Sessions, NP 06/24/2023, 8:22 PM

## 2023-06-24 NOTE — Patient Instructions (Signed)

## 2023-06-25 ENCOUNTER — Other Ambulatory Visit: Payer: Self-pay

## 2023-06-25 ENCOUNTER — Other Ambulatory Visit (INDEPENDENT_AMBULATORY_CARE_PROVIDER_SITE_OTHER): Payer: Self-pay | Admitting: Primary Care

## 2023-06-25 DIAGNOSIS — E559 Vitamin D deficiency, unspecified: Secondary | ICD-10-CM

## 2023-06-25 LAB — CBC WITH DIFFERENTIAL/PLATELET
Basophils Absolute: 0 10*3/uL (ref 0.0–0.2)
Basos: 0 %
EOS (ABSOLUTE): 0.1 10*3/uL (ref 0.0–0.4)
Eos: 1 %
Hematocrit: 37.3 % (ref 34.0–46.6)
Hemoglobin: 12.1 g/dL (ref 11.1–15.9)
Immature Grans (Abs): 0 10*3/uL (ref 0.0–0.1)
Immature Granulocytes: 0 %
Lymphocytes Absolute: 1.6 10*3/uL (ref 0.7–3.1)
Lymphs: 23 %
MCH: 27.6 pg (ref 26.6–33.0)
MCHC: 32.4 g/dL (ref 31.5–35.7)
MCV: 85 fL (ref 79–97)
Monocytes Absolute: 0.6 10*3/uL (ref 0.1–0.9)
Monocytes: 9 %
Neutrophils Absolute: 4.6 10*3/uL (ref 1.4–7.0)
Neutrophils: 67 %
Platelets: 267 10*3/uL (ref 150–450)
RBC: 4.39 x10E6/uL (ref 3.77–5.28)
RDW: 15.2 % (ref 11.7–15.4)
WBC: 7 10*3/uL (ref 3.4–10.8)

## 2023-06-25 LAB — CMP14+EGFR
ALT: 18 IU/L (ref 0–32)
AST: 23 IU/L (ref 0–40)
Albumin: 4.6 g/dL (ref 3.9–4.9)
Alkaline Phosphatase: 73 IU/L (ref 44–121)
BUN/Creatinine Ratio: 19 (ref 12–28)
BUN: 16 mg/dL (ref 8–27)
Bilirubin Total: 0.3 mg/dL (ref 0.0–1.2)
CO2: 23 mmol/L (ref 20–29)
Calcium: 9.5 mg/dL (ref 8.7–10.3)
Chloride: 106 mmol/L (ref 96–106)
Creatinine, Ser: 0.86 mg/dL (ref 0.57–1.00)
Globulin, Total: 2.9 g/dL (ref 1.5–4.5)
Glucose: 106 mg/dL — ABNORMAL HIGH (ref 70–99)
Potassium: 4.3 mmol/L (ref 3.5–5.2)
Sodium: 143 mmol/L (ref 134–144)
Total Protein: 7.5 g/dL (ref 6.0–8.5)
eGFR: 76 mL/min/{1.73_m2} (ref >59)

## 2023-06-25 LAB — VITAMIN D 25 HYDROXY (VIT D DEFICIENCY, FRACTURES): Vit D, 25-Hydroxy: 11.8 ng/mL — ABNORMAL LOW (ref 30.0–100.0)

## 2023-06-25 LAB — VITAMIN B12: Vitamin B-12: 373 pg/mL (ref 232–1245)

## 2023-06-25 LAB — LIPID PANEL
Chol/HDL Ratio: 2.6 ratio (ref 0.0–4.4)
Cholesterol, Total: 168 mg/dL (ref 100–199)
HDL: 64 mg/dL (ref >39)
LDL Chol Calc (NIH): 87 mg/dL (ref 0–99)
Triglycerides: 93 mg/dL (ref 0–149)
VLDL Cholesterol Cal: 17 mg/dL (ref 5–40)

## 2023-06-25 MED ORDER — ERGOCALCIFEROL 1.25 MG (50000 UT) PO CAPS
50000.0000 [IU] | ORAL_CAPSULE | ORAL | 0 refills | Status: DC
Start: 2023-06-25 — End: 2024-07-13
  Filled 2023-06-25: qty 8, 56d supply, fill #0

## 2023-07-02 ENCOUNTER — Encounter (INDEPENDENT_AMBULATORY_CARE_PROVIDER_SITE_OTHER): Payer: Self-pay

## 2023-07-11 ENCOUNTER — Ambulatory Visit (HOSPITAL_COMMUNITY): Payer: Medicaid Other | Admitting: Student in an Organized Health Care Education/Training Program

## 2023-07-11 ENCOUNTER — Other Ambulatory Visit: Payer: Self-pay

## 2023-07-11 VITALS — BP 120/87 | HR 107 | Wt 165.0 lb

## 2023-07-11 DIAGNOSIS — Z5181 Encounter for therapeutic drug level monitoring: Secondary | ICD-10-CM

## 2023-07-11 DIAGNOSIS — F319 Bipolar disorder, unspecified: Secondary | ICD-10-CM | POA: Diagnosis not present

## 2023-07-11 DIAGNOSIS — F411 Generalized anxiety disorder: Secondary | ICD-10-CM | POA: Diagnosis not present

## 2023-07-11 DIAGNOSIS — F1020 Alcohol dependence, uncomplicated: Secondary | ICD-10-CM

## 2023-07-11 MED ORDER — OXCARBAZEPINE 300 MG PO TABS
300.0000 mg | ORAL_TABLET | Freq: Two times a day (BID) | ORAL | 3 refills | Status: DC
  Filled 2023-07-11: qty 60, 30d supply, fill #0

## 2023-07-11 MED ORDER — BENZTROPINE MESYLATE 1 MG PO TABS
1.0000 mg | ORAL_TABLET | Freq: Every day | ORAL | 3 refills | Status: DC
  Filled 2023-07-11 – 2023-07-28 (×2): qty 30, 30d supply, fill #0

## 2023-07-11 MED ORDER — QUETIAPINE FUMARATE 400 MG PO TABS
800.0000 mg | ORAL_TABLET | Freq: Every day | ORAL | 3 refills | Status: DC
  Filled 2023-07-11 – 2023-07-28 (×2): qty 60, 30d supply, fill #0

## 2023-07-11 MED ORDER — DULOXETINE HCL 60 MG PO CPEP
60.0000 mg | ORAL_CAPSULE | Freq: Two times a day (BID) | ORAL | 3 refills | Status: DC
  Filled 2023-07-11 – 2023-07-28 (×2): qty 60, 30d supply, fill #0

## 2023-07-11 MED ORDER — HYDROXYZINE PAMOATE 50 MG PO CAPS
50.0000 mg | ORAL_CAPSULE | Freq: Three times a day (TID) | ORAL | 3 refills | Status: DC | PRN
Start: 2023-07-11 — End: 2023-07-30
  Filled 2023-07-11 – 2023-07-28 (×2): qty 120, 40d supply, fill #0

## 2023-07-11 MED ORDER — NALTREXONE HCL 50 MG PO TABS
50.0000 mg | ORAL_TABLET | Freq: Every day | ORAL | 3 refills | Status: DC
  Filled 2023-07-11 – 2023-07-28 (×2): qty 30, 30d supply, fill #0

## 2023-07-11 NOTE — Addendum Note (Signed)
Addended by: Eliseo Gum B on: 07/11/2023 02:16 PM   Modules accepted: Level of Service

## 2023-07-11 NOTE — Progress Notes (Signed)
BH MD/PA/NP OP Progress Note  07/11/2023 2:04 PM Angel Hughes  MRN:  [id number]  Chief Complaint:  Chief Complaint  Patient presents with   Follow-up   HPI: Angel Hughes is a 62 yo patient with a PPH of Bipolar disorder (possibly true SIMD instead) and Etoh Use disorder and a PMH of Etoh induced pancreatitis, T2DM, HTN, HLD, Vit D def. Patient reports that she has been taking her medications:   Seroquel 800mg  at bedtime, Cogentin 1mg  daily Cymbalta 60mg  bid  Trileptal 300mg  bid  Naltrexone 50mg  daily Hydroxyzine 50mg  tid prn   Patient reports that today is her 6th day sober. Patient reports that she restarted her naltrexone in the last 6 days as well. Patient reports that she is not having any cravings for Etoh. Patient reports that he has been having some bad days and good days in regards to finding her son dead. Patient reports that she thinks even without the etoh she feels the same level of sadness, and etoh would initially help her feel better. Patient reports that nothing in particular made her pick 6 days ago to be sober. Patient reports that she has other on her encouraging her to not drink. Patient denies feeling hopeless or worthless. Patient reports that she has been having some anhedonia. Patient denies SI, HI, and AVH. Patient reports that since starting sobriety her sleep has been more disrupted.  Patient reports that she feels nervous, anxious and on edge. Patient reports that this is mainly related to her son's cause of death taking so long with the corner. Patient reports that she feels her current medication regimen is helpful especially as she has recently stopped drinking. Patient reports that her appetite is fair, she eats 1 meal but does like to snack.   Patient reports that she works with a cousin 3x/week to help distract self from her son's issues. Patient reports that It has been helpful.  Patient reports that she has family she can talk to when needed about her  son's death.    Visit Diagnosis:    ICD-10-CM   1. Bipolar disorder with depression (HCC)  F31.9 QUEtiapine (SEROQUEL) 400 MG tablet    DULoxetine (CYMBALTA) 60 MG capsule    benztropine (COGENTIN) 1 MG tablet    Oxcarbazepine (TRILEPTAL) 300 MG tablet    2. Alcohol use disorder, severe, dependence (HCC)  F10.20 naltrexone (DEPADE) 50 MG tablet    3. Generalized anxiety disorder  F41.1 hydrOXYzine (VISTARIL) 50 MG capsule    DULoxetine (CYMBALTA) 60 MG capsule      Past Psychiatric History: Dx-Bipolar dx, SIMD, Etoh use d/o, GAD INPT: Negative OPT: Dr. Doyne Keel and Vesta Mixer Previous meds: Prozac, Paxil, Geodon NO SA   Trauma: Patient found her son deceased in his bed in 01/16/23.   Past Medical History:  Past Medical History:  Diagnosis Date   Allergic rhinitis    Anemia    Anxiety    Arthritis    Bipolar disorder (HCC)    Depression    GERD (gastroesophageal reflux disease)    Hyperlipemia    Hypertension    pt states that she has no hx of elevated BP, on med for hot flashes   Obesity     Past Surgical History:  Procedure Laterality Date   ABDOMINAL HYSTERECTOMY     CESAREAN SECTION     WRIST SURGERY     cyst   Z-PLASTY REPAIR      Family Psychiatric History: Mom: Some psych hx but unknown  specifics Dad: Etoh use disorder   Denies hx of SA Found son dead in bed 20-Dec-2022  Family History:  Family History  Problem Relation Age of Onset   Diabetes Paternal Grandmother    Hypertension Paternal Grandmother    Diabetes Maternal Grandmother    Hypertension Maternal Grandmother    Diabetes Mother    Hypertension Mother    Diabetes Brother    Diabetes Sister    Heart disease Sister    Hypertension Sister     Social History:  Social History   Socioeconomic History   Marital status: Legally Separated    Spouse name: Not on file   Number of children: Not on file   Years of education: Not on file   Highest education level: Not on file  Occupational History    Not on file  Tobacco Use   Smoking status: Every Day    Current packs/day: 0.50    Average packs/day: 0.5 packs/day for 34.0 years (17.0 ttl pk-yrs)    Types: Cigarettes   Smokeless tobacco: Never  Vaping Use   Vaping status: Never Used  Substance and Sexual Activity   Alcohol use: Yes    Alcohol/week: 0.0 standard drinks of alcohol    Comment: occassionally   Drug use: No   Sexual activity: Not on file  Other Topics Concern   Not on file  Social History Narrative   Right handed    Lives alone    Social Determinants of Health   Financial Resource Strain: Not on file  Food Insecurity: Not on file  Transportation Needs: Not on file  Physical Activity: Not on file  Stress: Not on file  Social Connections: Not on file   Currently living with her best friends mom  Allergies: No Known Allergies  Metabolic Disorder Labs: Lab Results  Component Value Date   HGBA1C 6.9 06/24/2023   MPG 126 (H) 06/01/2013   No results found for: "PROLACTIN" Lab Results  Component Value Date   CHOL 168 06/24/2023   TRIG 93 06/24/2023   HDL 64 06/24/2023   CHOLHDL 2.6 06/24/2023   VLDL 27 06/01/2013   LDLCALC 87 06/24/2023   LDLCALC 68 08/01/2022   Lab Results  Component Value Date   TSH 0.86 10/23/2020   TSH 0.694 02/19/2010    Therapeutic Level Labs: No results found for: "LITHIUM" No results found for: "VALPROATE" No results found for: "CBMZ"  Current Medications: Current Outpatient Medications  Medication Sig Dispense Refill   amLODipine (NORVASC) 10 MG tablet Take 1 tablet (10 mg total) by mouth daily. 30 tablet 0   BD [employer] MISC Place used needles and syringes in sharps container 1 each 0   benztropine (COGENTIN) 1 MG tablet Take 1 tablet (1 mg total) by mouth daily. 30 tablet 3   Cyanocobalamin (VITAMIN DEFICIENCY SYSTEM-B12) 1000 MCG/ML KIT 1000 mcg daily for 1 week, then weekly for 1 month, then monthly for a year 1 kit 0   DULoxetine (CYMBALTA) 60  MG capsule Take 1 capsule (60 mg total) by mouth 2 (two) times daily. 60 capsule 3   ergocalciferol (VITAMIN D2) 1.25 MG (50000 UT) capsule Take 1 capsule (50,000 Units total) by mouth once a week. 8 capsule 0   hydrOXYzine (VISTARIL) 50 MG capsule Take 1 capsule (50 mg total) by mouth 3 (three) times daily as needed. 120 capsule 3   metFORMIN (GLUCOPHAGE-XR) 500 MG 24 hr tablet Take 1 tablet (500 mg total) by mouth daily with breakfast.  90 tablet 1   naltrexone (DEPADE) 50 MG tablet Take 1 tablet (50 mg total) by mouth daily. 30 tablet 3   Oxcarbazepine (TRILEPTAL) 300 MG tablet Take 1 tablet (300 mg total) by mouth 2 (two) times daily. 60 tablet 3   propranolol (INDERAL) 20 MG tablet Take 1 tablet (20 mg total) by mouth 2 (two) times daily as needed for panic,agitation,sleep, and anxiety. 60 tablet 3   QUEtiapine (SEROQUEL) 400 MG tablet Take 2 tablets (800 mg total) by mouth at bedtime. 60 tablet 3   rosuvastatin (CRESTOR) 20 MG tablet Take 1 tablet (20 mg total) by mouth daily. 90 tablet 3   SYRINGE-NEEDLE, DISP, 3 ML 22G X 1-1/2" 3 ML MISC Use a new needle and syringe for each injection. 50 each 0   No current facility-administered medications for this visit.     Musculoskeletal: Strength & Muscle Tone: within normal limits Gait & Station: normal Patient leans: N/A  Psychiatric Specialty Exam: Review of Systems  Psychiatric/Behavioral:  Positive for dysphoric mood and sleep disturbance. Negative for hallucinations and suicidal ideas.     Blood pressure 120/87, pulse (!) 107, weight 165 lb (74.8 kg), SpO2 98%.Body mass index is 29.23 kg/m.  General Appearance: Casual  Eye Contact:  Good  Speech:  Clear and Coherent  Volume:  Normal  Mood:  Euthymic  Affect:  Appropriate  Thought Process:  Coherent  Orientation:  Full (Time, Place, and Person)  Thought Content: Logical   Suicidal Thoughts:  No  Homicidal Thoughts:  No  Memory:  Immediate;   Good Recent;   Good  Judgement:   Good  Insight:  Fair  Psychomotor Activity:  Normal  Concentration:  Concentration: Good  Recall:  Good  Fund of Knowledge: Good  Language: Good  Akathisia:  No  Handed:    AIMS (if indicated): not done  Assets:  Communication Skills Desire for Improvement Housing Leisure Time Resilience Social Support Transportation  ADL's:  Intact  Cognition: WNL  Sleep:  Poor   Screenings: CAGE-AID    Flowsheet Row Clinical Support from 08/08/2022 in [facility]  CAGE-AID Score 3      GAD-7    Flowsheet Row Office Visit from 06/24/2023 in [facility] Clinical Support from 03/13/2023 in [facility] Office Visit from 08/01/2022 in [facility] Office Visit from 01/10/2022 in [facility] Clinical Support from 07/25/2021 in [facility]  Total GAD-7 Score 11 16 12 15 15       PHQ2-9    Flowsheet Row Office Visit from 06/24/2023 in [facility] Clinical Support from 03/13/2023 in [facility] Office Visit from 08/01/2022 in [facility] Office Visit from 01/10/2022 in [facility] Clinical Support from 07/25/2021 in [facility]  PHQ-2 Total Score 6 5 4 5 2   PHQ-9 Total Score 13 16 13 15 10       Flowsheet Row Clinical Support from 01/17/2021 in [facility]  C-SSRS RISK CATEGORY No Risk        Assessment and Plan: At this time patient is not interested in medication change, given patient's history of losing her son within the past few months and finding him dead, and still waiting on the corners report, will not make any medication adjustments.  Patient is also 6 days sober and would not like  to increase risk of relapse by making medication adjustments as patient  feels that current regimen keeps her in a fairly stable place.  Did discuss with patient that in the future we may try decreasing patient's Seroquel.  Discussed with patient that there is concern her bipolar diagnoses may be true substance-induced mood disorder based on prior history.  However, patient is currently endorsing insomnia that is likely secondary to stopping EtOH intake as it started in the last 6 days when she stopped EtOH.  Patient is not having any cravings for alcohol with naltrexone since stopping EtOH.  Patient is endorsing positive behaviors to help distract herself from frequent thoughts of what happened to her son and is trying to move forward.  At this time patient's grief appears to be appropriate.  We will continue patient's Cymbalta that is already at max recommended dose.  Patient's anxiety also appears to be significantly related to receiving the coroner's report, but otherwise does not seem general at this time.  Bipolar disorder versus substance-induced mood disorder EtOH use disorder - Continue naltrexone 50 mg daily - Continue Cymbalta 60 mg twice daily - Continue Seroquel 800 mg nightly - Continue Trileptal 300 mg twice daily - Continue Cogentin 1 mg daily  Did discuss with patient, recommendation for EKG at next appointment with [facility].  Order has been placed and office was contacted.  Collaboration of Care: Collaboration of Care:   Patient/Guardian was advised Release of Information must be obtained prior to any record release in order to collaborate their care with an outside provider. Patient/Guardian was advised if they have not already done so to contact the registration department to sign all necessary forms in order for [place] to release information regarding their care.   Consent: Patient/Guardian gives verbal consent for treatment and assignment of benefits for services provided during this visit. Patient/Guardian expressed understanding and agreed to  proceed.   PGY-4 Bobbye Morton, MD 07/11/2023, 2:04 PM

## 2023-07-14 ENCOUNTER — Telehealth: Payer: Self-pay | Admitting: Primary Care

## 2023-07-14 NOTE — Telephone Encounter (Signed)
Copied from CRM [id number]. Topic: General - Other >> Jul 11, 2023  4:43 PM Angel Hughes wrote: Reason for CRM: The patient has returned a missed call from the practice regarding their labs from 06/24/23  Please contact further when available

## 2023-07-14 NOTE — Telephone Encounter (Signed)
Returned pt call to go over lab results pt is aware and doesn't have any questions or concerns

## 2023-07-15 ENCOUNTER — Other Ambulatory Visit: Payer: Self-pay

## 2023-07-15 ENCOUNTER — Ambulatory Visit: Payer: Medicaid Other | Admitting: Orthopedic Surgery

## 2023-07-15 ENCOUNTER — Other Ambulatory Visit (INDEPENDENT_AMBULATORY_CARE_PROVIDER_SITE_OTHER): Payer: Medicaid Other

## 2023-07-15 DIAGNOSIS — M67431 Ganglion, right wrist: Secondary | ICD-10-CM | POA: Diagnosis not present

## 2023-07-15 DIAGNOSIS — M67432 Ganglion, left wrist: Secondary | ICD-10-CM

## 2023-07-15 NOTE — Progress Notes (Signed)
Angel Hughes - 62 y.o. female MRN [id number]  Date of birth: Oct 05, 1960  Office Visit Note: Visit Date: 07/15/2023 PCP: Grayce Sessions, NP Referred by: Grayce Sessions, NP  Subjective: No chief complaint on file.  HPI: Angel Hughes is a pleasant 62 y.o. female who presents today for evaluation of bilateral volar wrist ganglion cysts.  Right side is undergone prior cyst excision elsewhere with recurrence x 2.  Pertinent ROS were reviewed with the patient and found to be negative unless otherwise specified above in HPI.   Visit Reason: bilateral wrists volar ganglion cyst Duration of symptoms: 3+ years Hand dominance: right Occupation: ? Diabetic: Yes/ 6.9 Smoking: Yes Heart/Lung History: none Blood Thinners:  none  Prior Testing/EMG: none Injections (Date):none Treatments: surgery on right Prior Surgery: right side years ago; x2  Volar sided ganglion cysts Painful, weakness Right side has been done twice but has come back  Assessment & Plan: Visit Diagnoses:  1. Bilateral ganglion cysts of wrists     Plan: Extensive discussion was had the patient today regarding her bilateral wrist volar ganglion cyst.  I did explain to her the etiology and pathophysiology of this condition.  We discussed the prior treatments that she has undergone including the right side being operated on multiple times with cyst recurrence.  I did explain that cyst recurrence will remain a possibility even after revision surgery in this area.  I also did explain that given the prior multiple operations in this region, there is a high prevalence of scar tissue which can make dissection more difficult, and does create increased risk.  I explained the proximity of the neurovascular structures in this region as well.  I would like to further investigate the right wrist with an MRI study for presurgical planning.  She expressed full understanding, return to me after the MRI is complete to review  results and discuss next steps.  Risks and benefits of the procedure for potential cyst excision were discussed, risks including but not limited to infection, bleeding, scarring, stiffness, nerve injury, tendon injury, vascular injury, hardware complication if utilized, recurrence of symptoms and need for subsequent operation.  Patient expressed understanding.      Follow-up: No follow-ups on file.   Meds & Orders: No orders of the defined types were placed in this encounter.   Orders Placed This Encounter  Procedures   XR Wrist Complete Right   XR Wrist Complete Left   MR Wrist Right w/o contrast     Procedures: No procedures performed      Clinical History: No specialty comments available.  She reports that she has been smoking cigarettes. She has a 17 pack-year smoking history. She has never used smokeless tobacco.  Recent Labs    06/24/23 1109  HGBA1C 6.9    Objective:   Vital Signs: There were no vitals taken for this visit.  Physical Exam  Gen: Well-appearing, in no acute distress; non-toxic CV: Regular Rate. Well-perfused. Warm.  Resp: Breathing unlabored on room air; no wheezing. Psych: Fluid speech in conversation; appropriate affect; normal thought process  Ortho Exam PHYSICAL EXAM:  General: Patient is well appearing and in no distress. Cervical spine mobility is full in all directions:  Skin and Muscle: Prior surgical incision over the right volar wrist well-healed  Range of Motion and Palpation Tests: Mobility is full about the elbows with flexion and extension.  Forearm supination and pronation are 85/85 bilaterally.  Wrist flexion/extension is 55/50 bilaterally.  Digital flexion and  extension are full.  Thumb opposition is full to the base of the small fingers bilaterally.    Oval mass over the volar radial aspect of the right wrist, just proximal to the wrist crease, measures 1.5 cm x 1 cm, minimally tender, mobile, compressible  Volar mass left  wrist, radial aspect, proximal to wrist crease, measures 1 cm x 1 cm, minimally tender, mobile, compressible  Neurologic, Vascular, Motor: Sensation is intact to light touch in the median/radial/ulnar distributions.  Allen's testing with appropriate refill at both radial and ulnar wrist level bilaterally. Fingers pink and well perfused.  Capillary refill is brisk.      Lab Results  Component Value Date   HGBA1C 6.9 06/24/2023     Imaging: Bilateral wrist x-rays were taken today, interpretations in chart  Past Medical/Family/Surgical/Social History: Medications & Allergies reviewed per EMR, new medications updated. Patient Active Problem List   Diagnosis Date Noted   Generalized anxiety disorder 06/07/2020   Anxiety    Hypertension    Arthritis    Depression    Bipolar disorder with depression (HCC)    Past Medical History:  Diagnosis Date   Allergic rhinitis    Anemia    Anxiety    Arthritis    Bipolar disorder (HCC)    Depression    GERD (gastroesophageal reflux disease)    Hyperlipemia    Hypertension    pt states that she has no hx of elevated BP, on med for hot flashes   Obesity    Family History  Problem Relation Age of Onset   Diabetes Paternal Grandmother    Hypertension Paternal Grandmother    Diabetes Maternal Grandmother    Hypertension Maternal Grandmother    Diabetes Mother    Hypertension Mother    Diabetes Brother    Diabetes Sister    Heart disease Sister    Hypertension Sister    Past Surgical History:  Procedure Laterality Date   ABDOMINAL HYSTERECTOMY     CESAREAN SECTION     WRIST SURGERY     cyst   Z-PLASTY REPAIR     Social History   Occupational History   Not on file  Tobacco Use   Smoking status: Every Day    Current packs/day: 0.50    Average packs/day: 0.5 packs/day for 34.0 years (17.0 ttl pk-yrs)    Types: Cigarettes   Smokeless tobacco: Never  Vaping Use   Vaping status: Never Used  Substance and Sexual Activity    Alcohol use: Yes    Alcohol/week: 0.0 standard drinks of alcohol    Comment: occassionally   Drug use: No   Sexual activity: Not on file    Angel Hughes) Angel Hughes, M.D. [facility] 5:56 PM'

## 2023-07-28 ENCOUNTER — Other Ambulatory Visit: Payer: Self-pay

## 2023-07-30 ENCOUNTER — Other Ambulatory Visit: Payer: Self-pay

## 2023-07-30 ENCOUNTER — Telehealth (HOSPITAL_COMMUNITY): Payer: Self-pay | Admitting: *Deleted

## 2023-07-30 ENCOUNTER — Other Ambulatory Visit (HOSPITAL_COMMUNITY): Payer: Self-pay | Admitting: Psychiatry

## 2023-07-30 DIAGNOSIS — F411 Generalized anxiety disorder: Secondary | ICD-10-CM

## 2023-07-30 DIAGNOSIS — F1020 Alcohol dependence, uncomplicated: Secondary | ICD-10-CM

## 2023-07-30 DIAGNOSIS — F319 Bipolar disorder, unspecified: Secondary | ICD-10-CM

## 2023-07-30 MED ORDER — HYDROXYZINE PAMOATE 50 MG PO CAPS
50.0000 mg | ORAL_CAPSULE | Freq: Three times a day (TID) | ORAL | 3 refills | Status: DC | PRN
  Filled 2023-07-30: qty 90, 30d supply, fill #0
  Filled 2023-09-01: qty 90, 30d supply, fill #1

## 2023-07-30 MED ORDER — BENZTROPINE MESYLATE 1 MG PO TABS
1.0000 mg | ORAL_TABLET | Freq: Every day | ORAL | 3 refills | Status: DC
  Filled 2023-07-30: qty 30, 30d supply, fill #0
  Filled 2023-09-01: qty 30, 30d supply, fill #1

## 2023-07-30 MED ORDER — QUETIAPINE FUMARATE 400 MG PO TABS
800.0000 mg | ORAL_TABLET | Freq: Every day | ORAL | 3 refills | Status: DC
  Filled 2023-07-30: qty 60, 30d supply, fill #0
  Filled 2023-09-01: qty 60, 30d supply, fill #1

## 2023-07-30 MED ORDER — NALTREXONE HCL 50 MG PO TABS
50.0000 mg | ORAL_TABLET | Freq: Every day | ORAL | 3 refills | Status: DC
  Filled 2023-07-30: qty 30, 30d supply, fill #0
  Filled 2023-09-01: qty 30, 30d supply, fill #1

## 2023-07-30 MED ORDER — DULOXETINE HCL 60 MG PO CPEP
60.0000 mg | ORAL_CAPSULE | Freq: Two times a day (BID) | ORAL | 3 refills | Status: DC
  Filled 2023-07-30: qty 60, 30d supply, fill #0
  Filled 2023-09-01: qty 60, 30d supply, fill #1

## 2023-07-30 MED ORDER — OXCARBAZEPINE 300 MG PO TABS
300.0000 mg | ORAL_TABLET | Freq: Two times a day (BID) | ORAL | 3 refills | Status: DC
  Filled 2023-07-30: qty 60, 30d supply, fill #0
  Filled 2023-09-01: qty 60, 30d supply, fill #1

## 2023-07-30 NOTE — Telephone Encounter (Signed)
Medication sent to preferred pharmacy

## 2023-07-30 NOTE — Telephone Encounter (Signed)
Patient called stating that her pharmacy can't fill any of her medications that Dr. Laurence Ferrari prescribed due to a Medicaid issue with her not being allowed to prescribe. Explained that her medications would be filled by someone in this office until the issue can be resolved. Patient very appreciative.

## 2023-07-31 ENCOUNTER — Other Ambulatory Visit: Payer: Self-pay

## 2023-08-01 ENCOUNTER — Other Ambulatory Visit: Payer: Self-pay

## 2023-08-19 ENCOUNTER — Encounter: Payer: Self-pay | Admitting: Primary Care

## 2023-08-19 ENCOUNTER — Other Ambulatory Visit: Payer: Medicaid Other

## 2023-09-01 ENCOUNTER — Other Ambulatory Visit: Payer: Self-pay

## 2023-09-01 ENCOUNTER — Other Ambulatory Visit (INDEPENDENT_AMBULATORY_CARE_PROVIDER_SITE_OTHER): Payer: Self-pay | Admitting: Primary Care

## 2023-09-03 NOTE — Telephone Encounter (Signed)
Will forward to provider  

## 2023-09-04 ENCOUNTER — Other Ambulatory Visit: Payer: Self-pay

## 2023-09-04 MED ORDER — METFORMIN HCL ER 500 MG PO TB24
500.0000 mg | ORAL_TABLET | Freq: Every day | ORAL | 1 refills | Status: DC
  Filled 2023-09-04 – 2023-10-21 (×3): qty 90, 90d supply, fill #0

## 2023-09-05 ENCOUNTER — Other Ambulatory Visit: Payer: Self-pay

## 2023-09-05 ENCOUNTER — Encounter (HOSPITAL_COMMUNITY): Payer: Medicaid Other | Admitting: Student in an Organized Health Care Education/Training Program

## 2023-09-17 ENCOUNTER — Other Ambulatory Visit: Payer: Self-pay

## 2023-09-26 ENCOUNTER — Ambulatory Visit (INDEPENDENT_AMBULATORY_CARE_PROVIDER_SITE_OTHER): Payer: Medicaid Other | Admitting: Student in an Organized Health Care Education/Training Program

## 2023-09-26 ENCOUNTER — Other Ambulatory Visit: Payer: Self-pay

## 2023-09-26 ENCOUNTER — Other Ambulatory Visit (HOSPITAL_COMMUNITY): Payer: Self-pay | Admitting: Psychiatry

## 2023-09-26 VITALS — BP 113/79 | HR 91 | Wt 162.0 lb

## 2023-09-26 DIAGNOSIS — F1020 Alcohol dependence, uncomplicated: Secondary | ICD-10-CM | POA: Insufficient documentation

## 2023-09-26 DIAGNOSIS — F4321 Adjustment disorder with depressed mood: Secondary | ICD-10-CM | POA: Diagnosis not present

## 2023-09-26 DIAGNOSIS — F411 Generalized anxiety disorder: Secondary | ICD-10-CM | POA: Diagnosis not present

## 2023-09-26 DIAGNOSIS — F319 Bipolar disorder, unspecified: Secondary | ICD-10-CM

## 2023-09-26 DIAGNOSIS — Z634 Disappearance and death of family member: Secondary | ICD-10-CM

## 2023-09-26 MED ORDER — BENZTROPINE MESYLATE 1 MG PO TABS
1.0000 mg | ORAL_TABLET | Freq: Every day | ORAL | 3 refills | Status: DC
  Filled 2023-09-26 – 2023-10-21 (×2): qty 30, 30d supply, fill #0
  Filled 2023-12-12: qty 30, 30d supply, fill #1

## 2023-09-26 MED ORDER — HYDROXYZINE PAMOATE 50 MG PO CAPS
50.0000 mg | ORAL_CAPSULE | Freq: Three times a day (TID) | ORAL | 3 refills | Status: DC | PRN
  Filled 2023-09-26: qty 120, 40d supply, fill #0
  Filled 2023-10-21: qty 90, 30d supply, fill #0
  Filled 2023-12-12: qty 90, 30d supply, fill #1

## 2023-09-26 MED ORDER — NALTREXONE HCL 50 MG PO TABS
50.0000 mg | ORAL_TABLET | Freq: Every day | ORAL | 3 refills | Status: DC
  Filled 2023-09-26 – 2023-10-21 (×2): qty 30, 30d supply, fill #0

## 2023-09-26 MED ORDER — DULOXETINE HCL 60 MG PO CPEP
60.0000 mg | ORAL_CAPSULE | Freq: Two times a day (BID) | ORAL | 3 refills | Status: DC
  Filled 2023-09-26 – 2023-10-21 (×2): qty 60, 30d supply, fill #0
  Filled 2023-12-12: qty 60, 30d supply, fill #1

## 2023-09-26 MED ORDER — GABAPENTIN 300 MG PO CAPS
300.0000 mg | ORAL_CAPSULE | Freq: Two times a day (BID) | ORAL | 1 refills | Status: DC
  Filled 2023-09-26 – 2023-10-21 (×2): qty 60, 30d supply, fill #0
  Filled 2023-12-12: qty 60, 30d supply, fill #1

## 2023-09-26 MED ORDER — QUETIAPINE FUMARATE 400 MG PO TABS
800.0000 mg | ORAL_TABLET | Freq: Every day | ORAL | 3 refills | Status: DC
  Filled 2023-09-26 – 2023-10-08 (×2): qty 60, 30d supply, fill #0
  Filled 2023-10-21 – 2023-11-06 (×2): qty 60, 30d supply, fill #1
  Filled 2023-12-12: qty 60, 30d supply, fill #2

## 2023-09-26 NOTE — Progress Notes (Signed)
BH MD/PA/NP OP Progress Note  09/26/2023 1:00 PM Angel Hughes  MRN:  [id number]  Chief Complaint:  Chief Complaint  Patient presents with   Follow-up   HPI: Angel Hughes is a 62 yo patient with a PPH of Bipolar disorder (possibly true SIMD instead) and Etoh Use disorder and a PMH of Etoh induced pancreatitis, T2DM, HTN, HLD, Vit D def. Patient is prescribed medications:   Seroquel 800mg  at bedtime, Cogentin 1mg  daily Cymbalta 60mg  bid  Trileptal 300mg  bid  Naltrexone 50mg  daily Hydroxyzine 50mg  tid prn (taking 4/ day)  Patient reports that she is "here" when asked about her mood and endorses that the holidays have been hard for her. Patient reports that she is missing her family during the holiday season and this lead to her relapse. Patient reports that she made it to 9 days before she had a relapse. Patient reports she drank yesterday. Patient reports that she is inconsistently taking her Naltrexone with the last time being approx 3 days ago.  Patient reports she drank a 40oz of beer yesterday, and endorses it is hard to quantify how much she is drinking on average because she does not drink everyday. Patient reports that if she takes the naltrexone she will not drink.  Patient reports that all she is doing during the day is looking at TV, which she enjoys. Patient reports that she is able to do her errands and her hygiene remains ok. Patient reports that she is averaging 7hrs of sleep but not feeling well rested and has low energy. Patient reports that her appetite may be decreasing she had an episode of emesis yesterday. Patient denies diarrhea.  Patient reports that she has noticed she has been more anxious lately and has been more shakey. Patient reports that she identifies some of her stressors, but is a bit guarded today. Patient reports that she is not having panic attacks. Patient denies SI, and passive SI, HI, and AVH.   Attempted to discuss groups like CDIOP with patient, but  patient endorses she has no interest in this.   Visit Diagnosis:    ICD-10-CM   1. Bipolar disorder with depression (HCC)  F31.9     2. Generalized anxiety disorder  F41.1     3. Alcohol use disorder, severe, dependence (HCC)  F10.20     4. Grief at loss of child  F32.21    Z63.4        Past Psychiatric History: Dx-Bipolar dx, SIMD, Etoh use d/o, GAD INPT: Negative OPT: Angel Hughes and Angel Hughes Previous meds: Prozac, Paxil, Geodon NO SA   Trauma: Patient found her son deceased in his bed in 02/09/23.   Past Medical History:  Past Medical History:  Diagnosis Date   Allergic rhinitis    Anemia    Anxiety    Arthritis    Bipolar disorder (HCC)    Depression    GERD (gastroesophageal reflux disease)    Hyperlipemia    Hypertension    pt states that she has no hx of elevated BP, on med for hot flashes   Obesity     Past Surgical History:  Procedure Laterality Date   ABDOMINAL HYSTERECTOMY     CESAREAN SECTION     WRIST SURGERY     cyst   Z-PLASTY REPAIR      Family Psychiatric History: Mom: Some psych hx but unknown specifics Dad: Etoh use disorder   Denies hx of SA Found son dead in bed 12/10/22  Family History:  Family History  Problem Relation Age of Onset   Diabetes Paternal Grandmother    Hypertension Paternal Grandmother    Diabetes Maternal Grandmother    Hypertension Maternal Grandmother    Diabetes Mother    Hypertension Mother    Diabetes Brother    Diabetes Sister    Heart disease Sister    Hypertension Sister     Social History:  Social History   Socioeconomic History   Marital status: Legally Separated    Spouse name: Not on file   Number of children: Not on file   Years of education: Not on file   Highest education level: Not on file  Occupational History   Not on file  Tobacco Use   Smoking status: Every Day    Current packs/day: 0.50    Average packs/day: 0.5 packs/day for 34.0 years (17.0 ttl pk-yrs)    Types: Cigarettes    Smokeless tobacco: Never  Vaping Use   Vaping status: Never Used  Substance and Sexual Activity   Alcohol use: Yes    Alcohol/week: 0.0 standard drinks of alcohol    Comment: occassionally   Drug use: No   Sexual activity: Not on file  Other Topics Concern   Not on file  Social History Narrative   Right handed    Lives alone    Social Drivers of Health   Financial Resource Strain: Not on file  Food Insecurity: Not on file  Transportation Needs: Not on file  Physical Activity: Not on file  Stress: Not on file  Social Connections: Not on file   Currently living with her best friends mom  Allergies: No Known Allergies  Metabolic Disorder Labs: Lab Results  Component Value Date   HGBA1C 6.9 06/24/2023   MPG 126 (H) 06/01/2013   No results found for: "PROLACTIN" Lab Results  Component Value Date   CHOL 168 06/24/2023   TRIG 93 06/24/2023   HDL 64 06/24/2023   CHOLHDL 2.6 06/24/2023   VLDL 27 06/01/2013   LDLCALC 87 06/24/2023   LDLCALC 68 08/01/2022   Lab Results  Component Value Date   TSH 0.86 10/23/2020   TSH 0.694 02/19/2010    Therapeutic Level Labs: No results found for: "LITHIUM" No results found for: "VALPROATE" No results found for: "CBMZ"  Current Medications: Current Outpatient Medications  Medication Sig Dispense Refill   amLODipine (NORVASC) 10 MG tablet Take 1 tablet (10 mg total) by mouth daily. 30 tablet 0   BD [employer] MISC Place used needles and syringes in sharps container 1 each 0   benztropine (COGENTIN) 1 MG tablet Take 1 tablet (1 mg total) by mouth daily. 30 tablet 3   Cyanocobalamin (VITAMIN DEFICIENCY SYSTEM-B12) 1000 MCG/ML KIT 1000 mcg daily for 1 week, then weekly for 1 month, then monthly for a year 1 kit 0   DULoxetine (CYMBALTA) 60 MG capsule Take 1 capsule (60 mg total) by mouth 2 (two) times daily. 60 capsule 3   ergocalciferol (VITAMIN D2) 1.25 MG (50000 UT) capsule Take 1 capsule (50,000 Units total) by mouth  once a week. 8 capsule 0   gabapentin (NEURONTIN) 300 MG capsule Take 1 capsule (300 mg total) by mouth 2 (two) times daily. 60 capsule 1   hydrOXYzine (VISTARIL) 50 MG capsule Take 1 capsule (50 mg total) by mouth 3 (three) times daily as needed. 120 capsule 3   metFORMIN (GLUCOPHAGE-XR) 500 MG 24 hr tablet Take 1 tablet (500 mg total) by mouth daily with  breakfast. 90 tablet 1   naltrexone (DEPADE) 50 MG tablet Take 1 tablet (50 mg total) by mouth daily. 30 tablet 3   propranolol (INDERAL) 20 MG tablet Take 1 tablet (20 mg total) by mouth 2 (two) times daily as needed for panic,agitation,sleep, and anxiety. 60 tablet 3   QUEtiapine (SEROQUEL) 400 MG tablet Take 2 tablets (800 mg total) by mouth at bedtime. 60 tablet 3   rosuvastatin (CRESTOR) 20 MG tablet Take 1 tablet (20 mg total) by mouth daily. 90 tablet 3   SYRINGE-NEEDLE, DISP, 3 ML 22G X 1-1/2" 3 ML MISC Use a new needle and syringe for each injection. 50 each 0   No current facility-administered medications for this visit.     Musculoskeletal: Strength & Muscle Tone: within normal limits Gait & Station: normal Patient leans: N/A  Psychiatric Specialty Exam: Review of Systems  Psychiatric/Behavioral:  Positive for dysphoric mood. Negative for hallucinations, sleep disturbance and suicidal ideas. The patient is nervous/anxious.     Blood pressure 113/79, pulse 91, weight 162 lb (73.5 kg), SpO2 99%.Body mass index is 28.7 kg/m.  General Appearance: Casual  Eye Contact:  Good  Speech:  Clear and Coherent  Volume:  Normal  Mood:  Dysphoric  Affect:  Depressed and guarded  Thought Process:  Coherent  Orientation:  Full (Time, Place, and Person)  Thought Content: Logical   Suicidal Thoughts:  No  Homicidal Thoughts:  No  Memory:  Immediate;   Good Recent;   Good  Judgement:  Poor  Insight:  Fair  Psychomotor Activity:  Normal  Concentration:  Concentration: Good  Recall:  Good  Fund of Knowledge: Good  Language: Good   Akathisia:  No  Handed:    AIMS (if indicated): not done  Assets:  Communication Skills Desire for Improvement Housing Leisure Time Resilience Social Support Transportation  ADL's:  Intact  Cognition: WNL  Sleep:  Fair   Screenings: CAGE-AID    Flowsheet Row Clinical Support from 08/08/2022 in [facility]  CAGE-AID Score 3      GAD-7    Flowsheet Row Office Visit from 06/24/2023 in [facility] Clinical Support from 03/13/2023 in [facility] Office Visit from 08/01/2022 in [facility] Office Visit from 01/10/2022 in [facility] Clinical Support from 07/25/2021 in [facility]  Total GAD-7 Score 11 16 12 15 15       PHQ2-9    Flowsheet Row Office Visit from 06/24/2023 in [facility] Clinical Support from 03/13/2023 in [facility] Office Visit from 08/01/2022 in [facility] Office Visit from 01/10/2022 in [facility] Clinical Support from 07/25/2021 in [facility]  PHQ-2 Total Score 6 5 4 5 2   PHQ-9 Total Score 13 16 13 15 10       Flowsheet Row Clinical Support from 01/17/2021 in [facility]  C-SSRS RISK CATEGORY No Risk        Assessment and Plan: Patient has relapsed on Etoh use and is a bit guarded today, but endorses dysphoric mood that she feels is more situational and starting to improve with the holiday season passing, but also notes increased anxiety. Patient had episode of emesis and decreased appetite. Spoke with patient about her Etoh intake and her hx of pancreatitis. Educated patient that if she is having pains or not able  to keep food down she needs to reach out to her PCP or go to the urgent care. At this time unsure if the emesis was  due to Etoh use or possibly anxiety. Patient is taking Hydroxyzine QID as well, will discontinue trileptal. Patient is at higher risk for hyponatremia with current presentation while on this medication. While start gabapentin as this can be helpful for anxiety and for etoh cravings. Patient voices a wish to stop, but is resistant to some changes and guarded about how much she is drinking and what could be triggering. Attempted to discuss referral to CDIOP, but patient is not open to this at this time. Will be following patient more closely. Because patient is drinking again, will not change Seroquel. Although patient is getting sleep she is not feeling well rested, and patient's previous concerning manic events occurred in the context of Etoh use.  However, patient had some improvement with Seroquel. Discussed that it would be best if patient pick a quit date for her Etoh and then start the naltrexone again. If patient still struggling with sobriety but voices wish to be sober, may consider antabuse at next appt.   Bipolar disorder versus substance-induced mood disorder EtOH use disorder Grief GAD - Continue naltrexone 50 mg daily - Continue Cymbalta 60 mg twice daily - Continue Seroquel 800 mg nightly - Discontinue Trileptal 300 mg twice daily -- Start gabapentin 300mg  bid - Continue Cogentin 1 mg daily -- Continue Hydroxyzine 50mg  TID PRN  Did discuss with patient again, recommendation for EKG at next appointment with [facility].  Order has been placed and office was contacted previously. Patient reports she will reach out this time.  Collaboration of Care: Collaboration of Care:   Patient/Guardian was advised Release of Information must be obtained prior to any record release in order to collaborate their care with an outside provider. Patient/Guardian was advised if they have not already done so to contact the registration department to sign all necessary forms in order for [place] to release  information regarding their care.   Consent: Patient/Guardian gives verbal consent for treatment and assignment of benefits for services provided during this visit. Patient/Guardian expressed understanding and agreed to proceed.   PGY-4 Bobbye Morton, MD 09/26/2023, 1:00 PM

## 2023-10-07 ENCOUNTER — Other Ambulatory Visit (INDEPENDENT_AMBULATORY_CARE_PROVIDER_SITE_OTHER): Payer: Self-pay | Admitting: Primary Care

## 2023-10-07 ENCOUNTER — Telehealth (HOSPITAL_COMMUNITY): Payer: Self-pay | Admitting: Student in an Organized Health Care Education/Training Program

## 2023-10-07 ENCOUNTER — Other Ambulatory Visit: Payer: Self-pay

## 2023-10-07 DIAGNOSIS — I10 Essential (primary) hypertension: Secondary | ICD-10-CM

## 2023-10-08 ENCOUNTER — Other Ambulatory Visit: Payer: Self-pay

## 2023-10-08 MED ORDER — ROSUVASTATIN CALCIUM 20 MG PO TABS
20.0000 mg | ORAL_TABLET | Freq: Every day | ORAL | 3 refills | Status: AC
  Filled 2023-10-08 – 2023-10-21 (×2): qty 90, 90d supply, fill #0
  Filled 2024-01-15: qty 90, 90d supply, fill #1
  Filled 2024-01-15: qty 30, 30d supply, fill #1
  Filled 2024-04-19: qty 90, 90d supply, fill #2
  Filled 2024-07-14 – 2024-09-14 (×3): qty 90, 90d supply, fill #3

## 2023-10-08 MED ORDER — AMLODIPINE BESYLATE 10 MG PO TABS
10.0000 mg | ORAL_TABLET | Freq: Every day | ORAL | 0 refills | Status: DC
  Filled 2023-10-08 – 2023-10-21 (×2): qty 30, 30d supply, fill #0

## 2023-10-20 ENCOUNTER — Other Ambulatory Visit: Payer: Self-pay

## 2023-10-21 ENCOUNTER — Other Ambulatory Visit: Payer: Self-pay

## 2023-10-24 ENCOUNTER — Encounter (HOSPITAL_COMMUNITY): Payer: Medicaid Other | Admitting: Student in an Organized Health Care Education/Training Program

## 2023-10-24 ENCOUNTER — Other Ambulatory Visit: Payer: Self-pay

## 2023-11-06 ENCOUNTER — Other Ambulatory Visit: Payer: Self-pay

## 2023-11-11 ENCOUNTER — Other Ambulatory Visit: Payer: Self-pay

## 2023-12-05 ENCOUNTER — Emergency Department (HOSPITAL_COMMUNITY)

## 2023-12-05 ENCOUNTER — Emergency Department (HOSPITAL_COMMUNITY)
Admission: EM | Admit: 2023-12-05 | Discharge: 2023-12-06 | Disposition: A | Discharge status: Home / Self Care | Attending: Emergency Medicine | Admitting: Emergency Medicine

## 2023-12-05 ENCOUNTER — Encounter (HOSPITAL_COMMUNITY): Payer: Self-pay | Admitting: Emergency Medicine

## 2023-12-05 ENCOUNTER — Other Ambulatory Visit: Payer: Self-pay

## 2023-12-05 DIAGNOSIS — F1010 Alcohol abuse, uncomplicated: Secondary | ICD-10-CM | POA: Diagnosis not present

## 2023-12-05 DIAGNOSIS — R55 Syncope and collapse: Secondary | ICD-10-CM | POA: Diagnosis not present

## 2023-12-05 DIAGNOSIS — R0689 Other abnormalities of breathing: Secondary | ICD-10-CM | POA: Diagnosis not present

## 2023-12-05 DIAGNOSIS — R059 Cough, unspecified: Secondary | ICD-10-CM | POA: Diagnosis not present

## 2023-12-05 DIAGNOSIS — I700 Atherosclerosis of aorta: Secondary | ICD-10-CM | POA: Diagnosis not present

## 2023-12-05 DIAGNOSIS — T50904A Poisoning by unspecified drugs, medicaments and biological substances, undetermined, initial encounter: Secondary | ICD-10-CM | POA: Diagnosis not present

## 2023-12-05 DIAGNOSIS — R000 Tachycardia, unspecified: Secondary | ICD-10-CM | POA: Diagnosis not present

## 2023-12-05 DIAGNOSIS — R0902 Hypoxemia: Secondary | ICD-10-CM | POA: Diagnosis not present

## 2023-12-05 DIAGNOSIS — Y903 Blood alcohol level of 60-79 mg/100 ml: Secondary | ICD-10-CM | POA: Diagnosis not present

## 2023-12-05 DIAGNOSIS — I10 Essential (primary) hypertension: Secondary | ICD-10-CM | POA: Diagnosis not present

## 2023-12-05 DIAGNOSIS — Z79899 Other long term (current) drug therapy: Secondary | ICD-10-CM | POA: Insufficient documentation

## 2023-12-05 DIAGNOSIS — S0990XA Unspecified injury of head, initial encounter: Secondary | ICD-10-CM | POA: Diagnosis not present

## 2023-12-05 DIAGNOSIS — T40601A Poisoning by unspecified narcotics, accidental (unintentional), initial encounter: Secondary | ICD-10-CM

## 2023-12-05 DIAGNOSIS — T402X1A Poisoning by other opioids, accidental (unintentional), initial encounter: Secondary | ICD-10-CM | POA: Insufficient documentation

## 2023-12-05 DIAGNOSIS — T887XXA Unspecified adverse effect of drug or medicament, initial encounter: Secondary | ICD-10-CM | POA: Diagnosis not present

## 2023-12-05 LAB — CBC WITH DIFFERENTIAL/PLATELET
Abs Immature Granulocytes: 0.02 10*3/uL (ref 0.00–0.07)
Basophils Absolute: 0 10*3/uL (ref 0.0–0.1)
Basophils Relative: 1 %
Eosinophils Absolute: 0.1 10*3/uL (ref 0.0–0.5)
Eosinophils Relative: 1 %
HCT: 32.9 % — ABNORMAL LOW (ref 36.0–46.0)
Hemoglobin: 11.6 g/dL — ABNORMAL LOW (ref 12.0–15.0)
Immature Granulocytes: 0 %
Lymphocytes Relative: 37 %
Lymphs Abs: 2.4 10*3/uL (ref 0.7–4.0)
MCH: 26.7 pg (ref 26.0–34.0)
MCHC: 35.3 g/dL (ref 30.0–36.0)
MCV: 75.8 fL — ABNORMAL LOW (ref 80.0–100.0)
Monocytes Absolute: 0.6 10*3/uL (ref 0.1–1.0)
Monocytes Relative: 9 %
Neutro Abs: 3.3 10*3/uL (ref 1.7–7.7)
Neutrophils Relative %: 52 %
Platelets: 273 10*3/uL (ref 150–400)
RBC: 4.34 MIL/uL (ref 3.87–5.11)
RDW: 15.9 % — ABNORMAL HIGH (ref 11.5–15.5)
WBC: 6.4 10*3/uL (ref 4.0–10.5)
nRBC: 0 % (ref 0.0–0.2)

## 2023-12-05 LAB — COMPREHENSIVE METABOLIC PANEL
ALT: 24 U/L (ref 0–44)
AST: 28 U/L (ref 15–41)
Albumin: 3.9 g/dL (ref 3.5–5.0)
Alkaline Phosphatase: 54 U/L (ref 38–126)
Anion gap: 14 (ref 5–15)
BUN: 12 mg/dL (ref 8–23)
CO2: 15 mmol/L — ABNORMAL LOW (ref 22–32)
Calcium: 8.9 mg/dL (ref 8.9–10.3)
Chloride: 106 mmol/L (ref 98–111)
Creatinine, Ser: 1.04 mg/dL — ABNORMAL HIGH (ref 0.44–1.00)
GFR, Estimated: >60 mL/min (ref >60)
Glucose, Bld: 207 mg/dL — ABNORMAL HIGH (ref 70–99)
Potassium: 3.5 mmol/L (ref 3.5–5.1)
Sodium: 135 mmol/L (ref 135–145)
Total Bilirubin: 0.6 mg/dL (ref 0.0–1.2)
Total Protein: 7.3 g/dL (ref 6.5–8.1)

## 2023-12-05 LAB — ETHANOL: Alcohol, Ethyl (B): 63 mg/dL — ABNORMAL HIGH (ref <10)

## 2023-12-05 MED ORDER — SODIUM CHLORIDE 0.9 % IV BOLUS
1000.0000 mL | Freq: Once | INTRAVENOUS | Status: AC
  Administered 2023-12-05: 1000 mL via INTRAVENOUS

## 2023-12-05 MED ORDER — ONDANSETRON HCL 4 MG/2ML IJ SOLN
4.0000 mg | Freq: Once | INTRAMUSCULAR | Status: AC
  Administered 2023-12-05: 4 mg via INTRAVENOUS
  Filled 2023-12-05: qty 2

## 2023-12-05 NOTE — ED Notes (Addendum)
 Patient now having periods of apnea where she is dropping to the 60's. [place] increased. Patient responsive to voice. MD notified.

## 2023-12-05 NOTE — ED Notes (Signed)
 Patient having brief periods of apnea with oxygen dropping into the 80's. Patient placed on 2L [place].

## 2023-12-05 NOTE — ED Provider Notes (Signed)
  EMERGENCY DEPARTMENT AT [facility] Provider Note   CSN: [id number] Arrival date & time: 12/05/23  1937     History  Chief Complaint  Patient presents with   Drug Overdose    Angel Hughes is a 63 y.o. female.  Patient brought in after getting Narcan in the field.  A friend called EMS after she was unresponsive with snoring respirations she had oxygen saturation in the 50s.  They assisted ventilations gave her 2 mg of Narcan intranasally without improvement and then gave her 5 mg Narcan IV and she became responsive.  Friend reports a history of substance abuse.  Patient denies any opiate abuse but does admit to alcohol drinking today.  She does not think she took anything knowingly with opioids in it.  She has a history of anxiety depression bipolar high cholesterol hypertension.  Patient feeling better.  Does not know if she hit her head or lose consciousness.  She denies any SI or HI.  The history is provided by the patient and the EMS personnel.       Home Medications Prior to Admission medications   Medication Sig Start Date End Date Taking? Authorizing Provider  amLODipine (NORVASC) 10 MG tablet Take 1 tablet (10 mg total) by mouth daily. 10/08/23   Grayce Sessions, NP  BD [employer] MISC Place used needles and syringes in sharps container 11/15/20   Van Clines, MD  benztropine (COGENTIN) 1 MG tablet Take 1 tablet (1 mg total) by mouth daily. 09/26/23   Rex Kras, MD  Cyanocobalamin (VITAMIN DEFICIENCY SYSTEM-B12) 1000 MCG/ML KIT 1000 mcg daily for 1 week, then weekly for 1 month, then monthly for a year 11/15/20   Van Clines, MD  DULoxetine (CYMBALTA) 60 MG capsule Take 1 capsule (60 mg total) by mouth 2 (two) times daily. 09/26/23   Rex Kras, MD  ergocalciferol (VITAMIN D2) 1.25 MG (50000 UT) capsule Take 1 capsule (50,000 Units total) by mouth once a week. 06/25/23   Grayce Sessions, NP  gabapentin (NEURONTIN) 300 MG  capsule Take 1 capsule (300 mg total) by mouth 2 (two) times daily. 09/26/23   Rex Kras, MD  hydrOXYzine (VISTARIL) 50 MG capsule Take 1 capsule (50 mg total) by mouth 3 (three) times daily as needed. 09/26/23   Rex Kras, MD  metFORMIN (GLUCOPHAGE-XR) 500 MG 24 hr tablet Take 1 tablet (500 mg total) by mouth daily with breakfast. 09/04/23   Grayce Sessions, NP  naltrexone (DEPADE) 50 MG tablet Take 1 tablet (50 mg total) by mouth daily. 09/26/23   Rex Kras, MD  propranolol (INDERAL) 20 MG tablet Take 1 tablet (20 mg total) by mouth 2 (two) times daily as needed for panic,agitation,sleep, and anxiety. 06/12/22   Grayce Sessions, NP  QUEtiapine (SEROQUEL) 400 MG tablet Take 2 tablets (800 mg total) by mouth at bedtime. 09/26/23   Rex Kras, MD  rosuvastatin (CRESTOR) 20 MG tablet Take 1 tablet (20 mg total) by mouth daily. 10/08/23   Grayce Sessions, NP  SYRINGE-NEEDLE, DISP, 3 ML 22G X 1-1/2" 3 ML MISC Use a new needle and syringe for each injection. 11/15/20   Van Clines, MD      Allergies    Patient has no known allergies.    Review of Systems   Review of Systems  Physical Exam Updated Vital Signs BP (!) 118/102   Pulse 93   Temp (!) 96.8 F (36 C) (Temporal)  Resp 12   Ht 5\' 3"  (1.6 m)   Wt 73.5 kg   SpO2 100%   BMI 28.70 kg/m  Physical Exam Vitals and nursing note reviewed.  Constitutional:      General: She is not in acute distress.    Appearance: She is well-developed. She is not ill-appearing.  HENT:     Head: Normocephalic and atraumatic.     Nose: Nose normal.     Mouth/Throat:     Mouth: Mucous membranes are moist.  Eyes:     Extraocular Movements: Extraocular movements intact.     Conjunctiva/sclera: Conjunctivae normal.     Pupils: Pupils are equal, round, and reactive to light.  Cardiovascular:     Rate and Rhythm: Normal rate and regular rhythm.     Pulses: Normal pulses.     Heart sounds: Normal heart sounds. No  murmur heard. Pulmonary:     Effort: Pulmonary effort is normal. No respiratory distress.     Breath sounds: Normal breath sounds.  Abdominal:     Palpations: Abdomen is soft.     Tenderness: There is no abdominal tenderness.  Musculoskeletal:        General: No swelling.     Cervical back: Normal range of motion and neck supple.  Skin:    General: Skin is warm and dry.     Capillary Refill: Capillary refill takes less than 2 seconds.  Neurological:     General: No focal deficit present.     Mental Status: She is alert and oriented to person, place, and time.     Cranial Nerves: No cranial nerve deficit.     Sensory: No sensory deficit.     Motor: No weakness.     Coordination: Coordination normal.  Psychiatric:        Mood and Affect: Mood normal.     ED Results / Procedures / Treatments   Labs (all labs ordered are listed, but only abnormal results are displayed) Labs Reviewed  CBC WITH DIFFERENTIAL/PLATELET - Abnormal; Notable for the following components:      Result Value   Hemoglobin 11.6 (*)    HCT 32.9 (*)    MCV 75.8 (*)    RDW 15.9 (*)    All other components within normal limits  COMPREHENSIVE METABOLIC PANEL - Abnormal; Notable for the following components:   CO2 15 (*)    Glucose, Bld 207 (*)    Creatinine, Ser 1.04 (*)    All other components within normal limits  ETHANOL - Abnormal; Notable for the following components:   Alcohol, Ethyl (B) 63 (*)    All other components within normal limits  RAPID URINE DRUG SCREEN, HOSP PERFORMED    EKG EKG Interpretation Date/Time:  Friday December 05 2023 19:46:28 EST Ventricular Rate:  102 PR Interval:  140 QRS Duration:  81 QT Interval:  390 QTC Calculation: 508 R Axis:   27  Text Interpretation: Sinus tachycardia Abnormal R-wave progression, early transition Confirmed by Virgina Norfolk [phone]) on 12/05/2023 8:01:37 PM  Radiology No results found.  Procedures Procedures    Medications Ordered in  ED Medications  ondansetron (ZOFRAN) injection 4 mg (4 mg Intravenous Given 12/05/23 2001)  sodium chloride 0.9 % bolus 1,000 mL (1,000 mLs Intravenous New Bag/Given 12/05/23 2001)    ED Course/ Medical Decision Making/ A&P  Medical Decision Making Amount and/or Complexity of Data Reviewed Labs: ordered. Radiology: ordered.  Risk Prescription drug management.   Angel Hughes is here after suspected overdose.  Patient was found unresponsive by friend.  Got Narcan in the field with improvement of symptoms.  Pinpoint pupils upon arrival here.  She denies any known drug ingestion does not with alcohol use today.  Friend does state that she has polysubstance abuse history.  Patient easily arousable but still somnolent.  Friend states that he had just gone into a store to get something when she came back patient was unresponsive.  Overall vital signs are reassuring here except for some mild tachycardia.  Will get CBC CMP alcohol level CT head chest x-ray and let her metabolize.  I do think this is likely an opioid overdose given that she had such a great and robust sponsor Narcan.  She denies any suicidal homicidal ideation.  This seems like it was accidental.  Will continue to monitor.  Lab work overall unremarkable.  Alcohol mildly elevated.  CT of the head and chest x-ray per my review interpretation is overall unremarkable but awaiting radiology report to confirm.  She is having some intermittent episodes of apnea but overall she is improving.  Will continue to monitor.  She continues to improve but she still somnolent.  She on my reevaluation does admit to sniffing something and suspect narcotic.  I was able to confirm with a friend that he went to a store and came back out to the car she was unresponsive.  Overall I do think this is an accidental overdose on her narcotic.  She continues to need some metabolization but I hope that as things were off she can be  discharged later.  Will place her on a Bair hugger as she is having some chills.  Little bit hypothermic now at 95 degrees.  Patient handed off to oncoming ED staff.  This chart was dictated using voice recognition software.  Despite best efforts to proofread,  errors can occur which can change the documentation meaning.         Final Clinical Impression(s) / ED Diagnoses Final diagnoses:  Opiate overdose, accidental or unintentional, initial encounter [facility])    Rx / DC Orders ED Discharge Orders     None         Virgina Norfolk, DO 12/05/23 2237

## 2023-12-05 NOTE — ED Triage Notes (Signed)
 Patient bib [facility] for possible overdose. Friend called ems and patient was found unresponsive with snoring respirations and oxygen saturation at 50%. Ventilations assisted. 2mg  narcan given intranasally without improvement. .5mg  narcan given IV and patient became responsive. A&Ox4 on arrival. Friend reports hx of substance abuse but patient denies opiate use.    BP 180/100  HR 113 CBG 221

## 2023-12-06 ENCOUNTER — Other Ambulatory Visit: Payer: Self-pay

## 2023-12-06 LAB — RAPID URINE DRUG SCREEN, HOSP PERFORMED
Amphetamines: POSITIVE — AB
Barbiturates: NOT DETECTED
Benzodiazepines: POSITIVE — AB
Cocaine: NOT DETECTED
Opiates: NOT DETECTED
Tetrahydrocannabinol: POSITIVE — AB

## 2023-12-06 MED ORDER — NALOXONE HCL 4 MG/0.1ML NA LIQD
NASAL | 1 refills | Status: DC
Start: 2023-12-06 — End: 2024-08-16
  Filled 2023-12-06: qty 2, 2d supply, fill #0

## 2023-12-06 MED ORDER — ONDANSETRON 4 MG PO TBDP
4.0000 mg | ORAL_TABLET | Freq: Once | ORAL | Status: AC
  Administered 2023-12-06: 4 mg via ORAL

## 2023-12-06 MED ORDER — ONDANSETRON 4 MG PO TBDP
ORAL_TABLET | ORAL | Status: DC
Start: 2023-12-06 — End: 2023-12-06
  Filled 2023-12-06: qty 1

## 2023-12-06 NOTE — Discharge Instructions (Addendum)

## 2023-12-06 NOTE — ED Provider Notes (Signed)
 Pt overall improved She is ambulatory No apnea/hypoxia Discussed risk of substance use Narcan has been prescribed Pt has ride home   Zadie Rhine, MD 12/06/23 [phone]

## 2023-12-08 ENCOUNTER — Other Ambulatory Visit: Payer: Self-pay

## 2023-12-12 ENCOUNTER — Other Ambulatory Visit: Payer: Self-pay

## 2023-12-12 ENCOUNTER — Other Ambulatory Visit (INDEPENDENT_AMBULATORY_CARE_PROVIDER_SITE_OTHER): Payer: Self-pay | Admitting: Primary Care

## 2023-12-12 DIAGNOSIS — I10 Essential (primary) hypertension: Secondary | ICD-10-CM

## 2023-12-12 MED ORDER — AMLODIPINE BESYLATE 10 MG PO TABS
10.0000 mg | ORAL_TABLET | Freq: Every day | ORAL | 0 refills | Status: DC
Start: 2023-12-12 — End: 2024-01-02
  Filled 2023-12-12: qty 30, 30d supply, fill #0

## 2023-12-15 ENCOUNTER — Other Ambulatory Visit: Payer: Self-pay

## 2023-12-19 ENCOUNTER — Ambulatory Visit (HOSPITAL_COMMUNITY): Payer: Medicaid Other | Admitting: Student in an Organized Health Care Education/Training Program

## 2023-12-19 ENCOUNTER — Other Ambulatory Visit: Payer: Self-pay

## 2023-12-19 VITALS — BP 123/81 | HR 107 | Wt 164.0 lb

## 2023-12-19 DIAGNOSIS — F1020 Alcohol dependence, uncomplicated: Secondary | ICD-10-CM | POA: Diagnosis not present

## 2023-12-19 DIAGNOSIS — F411 Generalized anxiety disorder: Secondary | ICD-10-CM

## 2023-12-19 DIAGNOSIS — F1994 Other psychoactive substance use, unspecified with psychoactive substance-induced mood disorder: Secondary | ICD-10-CM

## 2023-12-19 MED ORDER — DULOXETINE HCL 30 MG PO CPEP
90.0000 mg | ORAL_CAPSULE | Freq: Every day | ORAL | 3 refills | Status: DC
  Filled 2023-12-19 – 2024-01-15 (×3): qty 90, 30d supply, fill #0

## 2023-12-19 MED ORDER — GABAPENTIN 300 MG PO CAPS
300.0000 mg | ORAL_CAPSULE | Freq: Two times a day (BID) | ORAL | 1 refills | Status: DC
Start: 2023-12-19 — End: 2024-01-16
  Filled 2023-12-19 – 2024-01-15 (×3): qty 60, 30d supply, fill #0

## 2023-12-19 MED ORDER — HYDROXYZINE PAMOATE 50 MG PO CAPS
50.0000 mg | ORAL_CAPSULE | Freq: Three times a day (TID) | ORAL | 3 refills | Status: DC | PRN
  Filled 2023-12-19 – 2024-01-15 (×2): qty 120, 40d supply, fill #0
  Filled 2024-01-15: qty 90, 30d supply, fill #0

## 2023-12-19 MED ORDER — TOPIRAMATE 50 MG PO TABS
ORAL_TABLET | ORAL | 1 refills | Status: DC
Start: 2023-12-19 — End: 2024-01-16
  Filled 2023-12-19: qty 45, 31d supply, fill #0
  Filled 2024-01-15: qty 45, 36d supply, fill #1
  Filled 2024-01-15: qty 45, 22d supply, fill #1

## 2023-12-19 MED ORDER — TRAZODONE HCL 100 MG PO TABS
100.0000 mg | ORAL_TABLET | Freq: Every day | ORAL | 3 refills | Status: DC
  Filled 2023-12-19: qty 30, 30d supply, fill #0
  Filled 2024-01-15 (×2): qty 30, 30d supply, fill #1
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
## 2023-12-19 NOTE — Patient Instructions (Addendum)
 Medication Changes  Take 400mg  Seroquel 2 nights then 200mg  for 2 nights then discontinue Seroquel due to prolonged Qtc of 508 Decrease Cymbalta to 90mg  Start Topomax 25mg  for 1 week  Followed by 50mg  for 1 week  Followed by 100mg  for 1 week   4. No refill for Naltrexone 5. Start trazodone 100mg  nightly

## 2024-01-01 ENCOUNTER — Telehealth (INDEPENDENT_AMBULATORY_CARE_PROVIDER_SITE_OTHER): Payer: Self-pay | Admitting: Primary Care

## 2024-01-01 NOTE — Telephone Encounter (Signed)
 Called pt about upcoming appt. Pt did not answer and VM was left.

## 2024-01-02 ENCOUNTER — Ambulatory Visit (INDEPENDENT_AMBULATORY_CARE_PROVIDER_SITE_OTHER): Payer: Self-pay | Admitting: Primary Care

## 2024-01-02 ENCOUNTER — Other Ambulatory Visit: Payer: Self-pay

## 2024-01-02 ENCOUNTER — Encounter (INDEPENDENT_AMBULATORY_CARE_PROVIDER_SITE_OTHER): Payer: Self-pay | Admitting: Primary Care

## 2024-01-02 VITALS — BP 114/73 | HR 76 | Ht 63.0 in | Wt 164.4 lb

## 2024-01-02 DIAGNOSIS — E119 Type 2 diabetes mellitus without complications: Secondary | ICD-10-CM

## 2024-01-02 DIAGNOSIS — Z7984 Long term (current) use of oral hypoglycemic drugs: Secondary | ICD-10-CM

## 2024-01-02 DIAGNOSIS — N951 Menopausal and female climacteric states: Secondary | ICD-10-CM

## 2024-01-02 DIAGNOSIS — I10 Essential (primary) hypertension: Secondary | ICD-10-CM

## 2024-01-02 MED ORDER — ESTROGENS CONJUGATED 0.625 MG/GM VA CREA
1.0000 | TOPICAL_CREAM | Freq: Every day | VAGINAL | 12 refills | Status: DC
  Filled 2024-01-02: qty 60, 30d supply, fill #0

## 2024-01-02 MED ORDER — AMLODIPINE BESYLATE 10 MG PO TABS
10.0000 mg | ORAL_TABLET | Freq: Every day | ORAL | 1 refills | Status: DC
  Filled 2024-01-02 – 2024-01-15 (×2): qty 90, 90d supply, fill #0
  Filled 2024-01-15: qty 30, 30d supply, fill #0
  Filled 2024-04-19: qty 90, 90d supply, fill #1

## 2024-01-02 MED ORDER — METFORMIN HCL ER 500 MG PO TB24
500.0000 mg | ORAL_TABLET | Freq: Every day | ORAL | 1 refills | Status: DC
Start: 2024-01-02 — End: 2024-07-13
  Filled 2024-01-02 – 2024-01-15 (×2): qty 90, 90d supply, fill #0
  Filled 2024-01-15: qty 30, 30d supply, fill #0
  Filled 2024-06-21: qty 90, 90d supply, fill #1

## 2024-01-05 ENCOUNTER — Other Ambulatory Visit: Payer: Self-pay

## 2024-01-05 ENCOUNTER — Encounter (INDEPENDENT_AMBULATORY_CARE_PROVIDER_SITE_OTHER): Payer: Self-pay | Admitting: Primary Care

## 2024-01-05 MED ORDER — ESTROGENS CONJUGATED 0.3 MG PO TABS
0.3000 mg | ORAL_TABLET | Freq: Every day | ORAL | 0 refills | Status: DC
  Filled 2024-01-05: qty 21, 21d supply, fill #0

## 2024-01-05 NOTE — Progress Notes (Signed)
 Subjective:  Patient ID: Angel Hughes, female    DOB: Jun 12, 1961  Age: 63 y.o. MRN: [id number]  CC: Medical Management of Chronic Issues   Keishia Ground presents for hypertension- Patient has No headache, No chest pain, No abdominal pain - No Nausea, No new weakness tingling or numbness, No Cough - shortness of breath.  Major concern is hot flashes.  Short course of hormone replacement.  Referred to GYN if continues. Medications as noted below. Taking them regularly without complication/adverse reaction being reported today.   History Paquita has a past medical history of Allergic rhinitis, Anemia, Anxiety, Arthritis, Bipolar disorder (HCC), Depression, GERD (gastroesophageal reflux disease), Hyperlipemia, Hypertension, and Obesity.   She has a past surgical history that includes Abdominal hysterectomy; Wrist surgery; Z-plasty repair; and Cesarean section.   Her family history includes Diabetes in her brother, maternal grandmother, mother, paternal grandmother, and sister; Heart disease in her sister; Hypertension in her maternal grandmother, mother, paternal grandmother, and sister.She reports that she has been smoking cigarettes. She has a 17 pack-year smoking history. She has never used smokeless tobacco. She reports current alcohol use. She reports that she does not use drugs.  Current Outpatient Medications on File Prior to Visit  Medication Sig Dispense Refill   BD [employer] MISC Place used needles and syringes in sharps container 1 each 0   Cyanocobalamin (VITAMIN DEFICIENCY SYSTEM-B12) 1000 MCG/ML KIT 1000 mcg daily for 1 week, then weekly for 1 month, then monthly for a year 1 kit 0   DULoxetine (CYMBALTA) 30 MG capsule Take 3 capsules (90 mg total) by mouth daily. 90 capsule 3   ergocalciferol (VITAMIN D2) 1.25 MG (50000 UT) capsule Take 1 capsule (50,000 Units total) by mouth once a week. 8 capsule 0   gabapentin (NEURONTIN) 300 MG capsule Take 1 capsule (300 mg total) by  mouth 2 (two) times daily. 60 capsule 1   hydrOXYzine (VISTARIL) 50 MG capsule Take 1 capsule (50 mg total) by mouth 3 (three) times daily as needed. 120 capsule 3   naloxone (NARCAN) nasal spray 4 mg/0.1 mL Use as directed for opiate overdose 2 each 1   propranolol (INDERAL) 20 MG tablet Take 1 tablet (20 mg total) by mouth 2 (two) times daily as needed for panic,agitation,sleep, and anxiety. 60 tablet 3   rosuvastatin (CRESTOR) 20 MG tablet Take 1 tablet (20 mg total) by mouth daily. 90 tablet 3   SYRINGE-NEEDLE, DISP, 3 ML 22G X 1-1/2" 3 ML MISC Use a new needle and syringe for each injection. 50 each 0   topiramate (TOPAMAX) 50 MG tablet Take 0.5 tablets (25 mg total) by mouth daily for 7 days, THEN 1 tablet (50 mg total) daily for 7 days, THEN 2 tablets (100 mg total) daily. 45 tablet 1   traZODone (DESYREL) 100 MG tablet Take 1 tablet (100 mg total) by mouth at bedtime. 30 tablet 3   No current facility-administered medications on file prior to visit.    Review of Systems Comprehensive ROS Pertinent positive and negative noted in HPI   Objective:  BP 114/73   Pulse 76   Ht 5\' 3"  (1.6 m)   Wt 164 lb 6.4 oz (74.6 kg)   SpO2 98%   BMI 29.12 kg/m   BP Readings from Last 3 Encounters:  01/02/24 114/73  12/06/23 (!) 147/90  06/24/23 121/81    Wt Readings from Last 3 Encounters:  01/02/24 164 lb 6.4 oz (74.6 kg)  12/05/23 162 lb 0.6 oz (  73.5 kg)  06/24/23 166 lb 3.2 oz (75.4 kg)    Physical Exam Vitals reviewed.  HENT:     Right Ear: External ear normal.     Left Ear: External ear normal.  Eyes:     Extraocular Movements: Extraocular movements intact.  Pulmonary:     Effort: Pulmonary effort is normal.     Breath sounds: Normal breath sounds.  Abdominal:     General: Bowel sounds are normal. There is distension.     Palpations: Abdomen is soft.  Musculoskeletal:        General: Normal range of motion.     Cervical back: Normal range of motion.  Skin:    General:  Skin is warm and dry.  Neurological:     Mental Status: She is alert and oriented to person, place, and time.  Psychiatric:        Mood and Affect: Mood normal.        Behavior: Behavior normal.    Lab Results  Component Value Date   HGBA1C 6.9 06/24/2023   HGBA1C 6.9 (A) 04/11/2022   HGBA1C 6.3 (A) 08/14/2021    Lab Results  Component Value Date   WBC 6.4 12/05/2023   HGB 11.6 (L) 12/05/2023   HCT 32.9 (L) 12/05/2023   PLT 273 12/05/2023   GLUCOSE 207 (H) 12/05/2023   CHOL 168 06/24/2023   TRIG 93 06/24/2023   HDL 64 06/24/2023   LDLCALC 87 06/24/2023   ALT 24 12/05/2023   AST 28 12/05/2023   NA 135 12/05/2023   K 3.5 12/05/2023   CL 106 12/05/2023   CREATININE 1.04 (H) 12/05/2023   BUN 12 12/05/2023   CO2 15 (L) 12/05/2023   TSH 0.86 10/23/2020   HGBA1C 6.9 06/24/2023     Assessment & Plan:   Analese was seen today for medical management of chronic issues.  Diagnoses and all orders for this visit:  Type 2 diabetes mellitus without complication, without long-term current use of insulin (HCC) - educated on lifestyle modifications, including but not limited to diet choices and adding exercise to daily routine.   -     metFORMIN (GLUCOPHAGE-XR) 500 MG 24 hr tablet; Take 1 tablet (500 mg total) by mouth daily with breakfast.  Essential hypertension Well-controlled DIET: Limit salt intake, read nutrition labels to check salt content, limit fried and high fatty foods  Avoid using multisymptom OTC cold preparations that generally contain sudafed which can rise BP. Consult with pharmacist on best cold relief products to use for persons with HTN EXERCISE Discussed incorporating exercise such as walking - 30 minutes most days of the week and can do in 10 minute intervals    -     amLODipine (NORVASC) 10 MG tablet; Take 1 tablet (10 mg total) by mouth daily.  Hot flashes due to menopause estrogens, conjugated, (PREMARIN) 0.3 MG tablet-      Take 1 tablet (0.3 mg total)  by mouth daily. Take daily for 21 days then do not take for 7 days., Starting Mon   Follow-up:  Return in about 3 months (around 04/02/2024) for Hypertension and diabetes.  The above assessment and management plan was discussed with the patient. The patient verbalized understanding of and has agreed to the management plan. Patient is aware to call the clinic if symptoms fail to improve or worsen. Patient is aware when to return to the clinic for a follow-up visit. Patient educated on when it is appropriate to go to the emergency  department.   Gwinda Passe, NP-C

## 2024-01-08 ENCOUNTER — Other Ambulatory Visit: Payer: Self-pay

## 2024-01-09 ENCOUNTER — Encounter (HOSPITAL_COMMUNITY): Admitting: Student in an Organized Health Care Education/Training Program

## 2024-01-09 ENCOUNTER — Other Ambulatory Visit: Payer: Self-pay
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
## 2024-01-15 ENCOUNTER — Other Ambulatory Visit: Payer: Self-pay

## 2024-01-16 ENCOUNTER — Ambulatory Visit (HOSPITAL_COMMUNITY): Admitting: Student in an Organized Health Care Education/Training Program

## 2024-01-16 ENCOUNTER — Other Ambulatory Visit: Payer: Self-pay

## 2024-01-16 VITALS — BP 122/84 | HR 83 | Wt 160.0 lb

## 2024-01-16 DIAGNOSIS — G4700 Insomnia, unspecified: Secondary | ICD-10-CM

## 2024-01-16 DIAGNOSIS — F1020 Alcohol dependence, uncomplicated: Secondary | ICD-10-CM | POA: Diagnosis not present

## 2024-01-16 DIAGNOSIS — F411 Generalized anxiety disorder: Secondary | ICD-10-CM

## 2024-01-16 DIAGNOSIS — F1994 Other psychoactive substance use, unspecified with psychoactive substance-induced mood disorder: Secondary | ICD-10-CM

## 2024-01-16 MED ORDER — HYDROXYZINE PAMOATE 50 MG PO CAPS
50.0000 mg | ORAL_CAPSULE | Freq: Three times a day (TID) | ORAL | 1 refills | Status: DC | PRN
  Filled 2024-01-27: qty 90, 30d supply, fill #0
  Filled 2024-03-09: qty 90, 30d supply, fill #1

## 2024-01-16 MED ORDER — GABAPENTIN 300 MG PO CAPS
300.0000 mg | ORAL_CAPSULE | Freq: Three times a day (TID) | ORAL | 1 refills | Status: DC
  Filled 2024-01-16 (×2): qty 90, 30d supply, fill #0
  Filled 2024-02-18 – 2024-03-09 (×2): qty 90, 30d supply, fill #1

## 2024-01-16 MED ORDER — TOPIRAMATE 100 MG PO TABS
100.0000 mg | ORAL_TABLET | Freq: Every day | ORAL | 1 refills | Status: DC
  Filled 2024-01-16: qty 30, 30d supply, fill #0
  Filled 2024-02-18 – 2024-03-08 (×3): qty 30, 30d supply, fill #1

## 2024-01-16 MED ORDER — DULOXETINE HCL 30 MG PO CPEP
90.0000 mg | ORAL_CAPSULE | Freq: Every day | ORAL | 1 refills | Status: DC
  Filled 2024-01-16: qty 90, 30d supply, fill #0
  Filled 2024-02-18 – 2024-03-09 (×2): qty 90, 30d supply, fill #1

## 2024-01-16 MED ORDER — TRAZODONE HCL 100 MG PO TABS
100.0000 mg | ORAL_TABLET | Freq: Every day | ORAL | 1 refills | Status: DC
Start: 2024-01-16 — End: 2024-04-13
  Filled 2024-02-18 – 2024-03-09 (×2): qty 30, 30d supply, fill #0

## 2024-01-16 NOTE — Progress Notes (Signed)
 BH MD/PA/NP OP Progress Note  01/16/2024 1:04 PM Meganne Rita  MRN:  [id number]  Chief Complaint:  Chief Complaint  Patient presents with   Follow-up   HPI: Angel Hughes is a 63 yo patient with a PPH of Bipolar disorder (possibly true SIMD instead) and Etoh Use disorder and a PMH of Etoh induced pancreatitis, T2DM, HTN, HLD, Vit D def. Patient is prescribed medications:   Topamax  100mg  daily Trazodone  100mg  qhs Cymbalta  90mg  daily  Gabapentin  300mg  bid Hydroxyzine  50mg  TID PRN  Patient reports that she has not had a drink since 12/19/23. Pt reports that she is averaging Hydroxyzine  TID. Pt reports that she has limited her interactions with others at home which helps decrease risk for relapse. Pt reports that she has had less cravings and urges and overall feels she has more control staying away from [place]. Pt reports that her appetite is stable and she feels she is actually eating a bit more. Pt reports that she struggles staying asleep at night and therefore has low energy during the day. Pt reports that she is volunteering during the day at least 2-3 days/ week. Pt reports she really likes the positive environment. Pt reports that she mentally in a good place. Patient denies dysphoria, episodes of crying, SI, HI, and AVH.   Pt reports that she does have occasional episodes of the feeling anxious and overwhelmed the hydroxyzine  helps and patient is able to calm down.   Etoh:none THC: 1 blunt/day Cigs: 0.5ppd Caffeine: no    Visit Diagnosis:    ICD-10-CM   1. Substance induced mood disorder (HCC)  F19.94 DULoxetine  (CYMBALTA ) 30 MG capsule    2. Generalized anxiety disorder  F41.1 DULoxetine  (CYMBALTA ) 30 MG capsule    gabapentin  (NEURONTIN ) 300 MG capsule    hydrOXYzine  (VISTARIL ) 50 MG capsule    traZODone  (DESYREL ) 100 MG tablet    3. Alcohol use disorder, severe, dependence (HCC)  F10.20 DULoxetine  (CYMBALTA ) 30 MG capsule    gabapentin  (NEURONTIN ) 300 MG capsule     traZODone  (DESYREL ) 100 MG tablet    4. Insomnia, unspecified type  G47.00          Past Psychiatric History: Dx-Bipolar dx, SIMD, Etoh use d/o, GAD INPT: Negative OPT: Dr. Melisa Spray and Librado Reef Previous meds: Prozac, Paxil, Geodon NO SA   Trauma: Patient found her son deceased in his bed in 30-Jan-2023.   Past Medical History:  Past Medical History:  Diagnosis Date   Allergic rhinitis    Anemia    Anxiety    Arthritis    Bipolar disorder (HCC)    Depression    GERD (gastroesophageal reflux disease)    Hyperlipemia    Hypertension    pt states that she has no hx of elevated BP, on med for hot flashes   Obesity     Past Surgical History:  Procedure Laterality Date   ABDOMINAL HYSTERECTOMY     CESAREAN SECTION     WRIST SURGERY     cyst   Z-PLASTY REPAIR      Family Psychiatric History: Mom: Some psych hx but unknown specifics Dad: Etoh use disorder   Denies hx of SA Found son dead in bed Nov 30, 2022  Family History:  Family History  Problem Relation Age of Onset   Diabetes Paternal Grandmother    Hypertension Paternal Grandmother    Diabetes Maternal Grandmother    Hypertension Maternal Grandmother    Diabetes Mother    Hypertension Mother    Diabetes Brother  Diabetes Sister    Heart disease Sister    Hypertension Sister     Social History:  Social History   Socioeconomic History   Marital status: Legally Separated    Spouse name: Not on file   Number of children: Not on file   Years of education: Not on file   Highest education level: Not on file  Occupational History   Not on file  Tobacco Use   Smoking status: Every Day    Current packs/day: 0.50    Average packs/day: 0.5 packs/day for 34.0 years (17.0 ttl pk-yrs)    Types: Cigarettes   Smokeless tobacco: Never  Vaping Use   Vaping status: Never Used  Substance and Sexual Activity   Alcohol use: Yes    Alcohol/week: 0.0 standard drinks of alcohol    Comment: occassionally   Drug use: No    Sexual activity: Not on file  Other Topics Concern   Not on file  Social History Narrative   Right handed    Lives alone    Social Drivers of Health   Financial Resource Strain: Not on file  Food Insecurity: Not on file  Transportation Needs: Not on file  Physical Activity: Not on file  Stress: Not on file  Social Connections: Not on file   Currently living with her best friends mom  Allergies: No Known Allergies  Metabolic Disorder Labs: Lab Results  Component Value Date   HGBA1C 6.9 06/24/2023   MPG 126 (H) 06/01/2013   No results found for: "PROLACTIN" Lab Results  Component Value Date   CHOL 168 06/24/2023   TRIG 93 06/24/2023   HDL 64 06/24/2023   CHOLHDL 2.6 06/24/2023   VLDL 27 06/01/2013   LDLCALC 87 06/24/2023   LDLCALC 68 08/01/2022   Lab Results  Component Value Date   TSH 0.86 10/23/2020   TSH 0.694 02/19/2010    Therapeutic Level Labs: No results found for: "LITHIUM" No results found for: "VALPROATE" No results found for: "CBMZ"  Current Medications: Current Outpatient Medications  Medication Sig Dispense Refill   topiramate  (TOPAMAX ) 100 MG tablet Take 1 tablet (100 mg total) by mouth daily. 90 tablet 1   amLODipine  (NORVASC ) 10 MG tablet Take 1 tablet (10 mg total) by mouth daily. 90 tablet 1   BD Sharps Container Home MISC Place used needles and syringes in sharps container 1 each 0   Cyanocobalamin  (VITAMIN DEFICIENCY SYSTEM-B12) 1000 MCG/ML KIT 1000 mcg daily for 1 week, then weekly for 1 month, then monthly for a year 1 kit 0   DULoxetine  (CYMBALTA ) 30 MG capsule Take 3 capsules (90 mg total) by mouth daily. 90 capsule 1   ergocalciferol  (VITAMIN D2) 1.25 MG (50000 UT) capsule Take 1 capsule (50,000 Units total) by mouth once a week. 8 capsule 0   estrogens , conjugated, (PREMARIN ) 0.3 MG tablet Take 1 tablet (0.3 mg total) by mouth daily. Take daily for 21 days then do not take for 7 days. 21 tablet 0   gabapentin  (NEURONTIN ) 300 MG  capsule Take 1 capsule (300 mg total) by mouth 3 (three) times daily. 90 capsule 1   hydrOXYzine  (VISTARIL ) 50 MG capsule Take 1 capsule (50 mg total) by mouth 3 (three) times daily as needed. 120 capsule 1   metFORMIN  (GLUCOPHAGE -XR) 500 MG 24 hr tablet Take 1 tablet (500 mg total) by mouth daily with breakfast. 90 tablet 1   naloxone  (NARCAN ) nasal spray 4 mg/0.1 mL Use as directed for opiate overdose 2  each 1   propranolol  (INDERAL ) 20 MG tablet Take 1 tablet (20 mg total) by mouth 2 (two) times daily as needed for panic,agitation,sleep, and anxiety. 60 tablet 3   rosuvastatin  (CRESTOR ) 20 MG tablet Take 1 tablet (20 mg total) by mouth daily. 90 tablet 3   SYRINGE-NEEDLE, DISP, 3 ML 22G X 1-1/2" 3 ML MISC Use a new needle and syringe for each injection. 50 each 0   traZODone  (DESYREL ) 100 MG tablet Take 1 tablet (100 mg total) by mouth at bedtime. 30 tablet 1   No current facility-administered medications for this visit.     Musculoskeletal: Strength & Muscle Tone: within normal limits Gait & Station: normal Patient leans: N/A  Psychiatric Specialty Exam: Review of Systems  Psychiatric/Behavioral:  Negative for dysphoric mood, hallucinations, sleep disturbance and suicidal ideas. The patient is not nervous/anxious.     Blood pressure 122/84, pulse 83, weight 160 lb (72.6 kg), SpO2 98%.Body mass index is 28.34 kg/m.  General Appearance: Casual  Eye Contact:  Good  Speech:  Clear and Coherent  Volume:  Normal  Mood:  Euthymic  Affect:  Congruent  Thought Process:  Coherent  Orientation:  Full (Time, Place, and Person)  Thought Content: Logical   Suicidal Thoughts:  No  Homicidal Thoughts:  No  Memory:  Immediate;   Good Recent;   Good  Judgement:  Poor  Insight:  Fair  Psychomotor Activity:  Normal  Concentration:  Concentration: Good  Recall:  Good  Fund of Knowledge: Good  Language: Good  Akathisia:  No  Handed:    AIMS (if indicated): not done  Assets:   Communication Skills Desire for Improvement Housing Leisure Time Resilience Social Support Transportation  ADL's:  Intact  Cognition: WNL  Sleep:  Poor   Screenings: CAGE-AID    Flowsheet Row Clinical Support from 08/08/2022 in [facility]  CAGE-AID Score 3      GAD-7    Flowsheet Row Office Visit from 06/24/2023 in [facility] Clinical Support from 03/13/2023 in [facility] Office Visit from 08/01/2022 in [facility] Office Visit from 01/10/2022 in [facility] Clinical Support from 07/25/2021 in [facility]  Total GAD-7 Score 11 16 12 15 15       PHQ2-9    Flowsheet Row Office Visit from 06/24/2023 in [facility] Clinical Support from 03/13/2023 in [facility] Office Visit from 08/01/2022 in [facility] Office Visit from 01/10/2022 in [facility] Clinical Support from 07/25/2021 in [facility]  PHQ-2 Total Score 6 5 4 5 2   PHQ-9 Total Score 13 16 13 15 10       Flowsheet Row ED from 12/05/2023 in [facility] Emergency Department at [facility] Clinical Support from 01/17/2021 in [facility]  C-SSRS RISK CATEGORY No Risk No Risk        Assessment and Plan: Discussed with patient that insomnia could be rebound 2/2 to cessation of Seroquel  and Etoh. Pt does not endorse manic behaviors, will add at bedtime dose of gabapentin  and continue to recommend sobriety from Etoh. Did discuss melatonin with pt, but pt not interested at this time. Pt vitals and mood appear improved with sobriety. Topamax  appears to be beneficial for cravings.   EKG 11/2023- QTC 508 and HR 102  Bipolar disorder versus substance-induced mood  disorder EtOH use  disorder Grief GAD Cannabis use d/o, moderate - Continue trazodone  100mg  at bedtime  - Continue Cymbalta  to 90 mg daily - Continue Topomax 100mg  daily -- Increase gabapentin  300mg  tid -- Continue Hydroxyzine  50mg  TID PRN  F/u in 4 weeks  Collaboration of Care: Collaboration of Care:   Patient/Guardian was advised Release of Information must be obtained prior to any record release in order to collaborate their care with an outside provider. Patient/Guardian was advised if they have not already done so to contact the registration department to sign all necessary forms in order for us  to release information regarding their care.   Consent: Patient/Guardian gives verbal consent for treatment and assignment of benefits for services provided during this visit. Patient/Guardian expressed understanding and agreed to proceed.   PGY-4 Tamera Falco, MD 01/16/2024, 1:04 PM

## 2024-01-21 ENCOUNTER — Other Ambulatory Visit: Payer: Self-pay

## 2024-01-27 ENCOUNTER — Other Ambulatory Visit: Payer: Self-pay

## 2024-01-30 ENCOUNTER — Other Ambulatory Visit: Payer: Self-pay

## 2024-01-30 ENCOUNTER — Other Ambulatory Visit (INDEPENDENT_AMBULATORY_CARE_PROVIDER_SITE_OTHER): Payer: Self-pay | Admitting: Primary Care

## 2024-01-30 ENCOUNTER — Ambulatory Visit: Payer: Self-pay

## 2024-01-30 DIAGNOSIS — N951 Menopausal and female climacteric states: Secondary | ICD-10-CM

## 2024-01-30 NOTE — Telephone Encounter (Signed)
 This RN made first attempt to triage patient. No answer, LVM. Routing for additional attempts.   Copied from CRM [id number]. Topic: Clinical - Medical Advice >> Jan 30, 2024  2:04 PM Tiffany S wrote: Reason for CRM: Patient stated she is having back pain and swelling in her legs patient is asking if provider can prescribe medication for pain

## 2024-01-30 NOTE — Telephone Encounter (Signed)
 3rd attempt to reach pt: no answer left voicemail: will route to PCP office.

## 2024-01-30 NOTE — Telephone Encounter (Signed)
 2nd attempt, LVM requesting pt return call

## 2024-01-30 NOTE — Telephone Encounter (Signed)
 Copied from CRM [id number]. Topic: Clinical - Medication Refill >> Jan 30, 2024  2:02 PM Tiffany S wrote: Most Recent Primary Care Visit:  Provider: Marius Siemens  Department: [facility]  Visit Type: OFFICE VISIT  Date: 01/02/2024  Medication: estrogens , conjugated, (PREMARIN ) 0.3 MG tablet [[id number]]  Has the patient contacted their pharmacy? Yes (Agent: If no, request that the patient contact the pharmacy for the refill. If patient does not wish to contact the pharmacy document the reason why and proceed with request.) (Agent: If yes, when and what did the pharmacy advise?)  Is this the correct pharmacy for this prescription? Yes If no, delete pharmacy and type the correct one.  This is the patient's preferred pharmacy:  [facility] [address] [place] [address] Phone: [phone] Fax: [phone]   Has the prescription been filled recently? Yes  Is the patient out of the medication? Yes  Has the patient been seen for an appointment in the last year OR does the patient have an upcoming appointment? Yes  Can we respond through MyChart? Yes  Agent: Please be advised that Rx refills may take up to 3 business days. We ask that you follow-up with your pharmacy.

## 2024-02-01 ENCOUNTER — Other Ambulatory Visit: Payer: Self-pay | Admitting: Nurse Practitioner

## 2024-02-01 DIAGNOSIS — M545 Low back pain, unspecified: Secondary | ICD-10-CM

## 2024-02-01 MED ORDER — METHOCARBAMOL 500 MG PO TABS
500.0000 mg | ORAL_TABLET | Freq: Three times a day (TID) | ORAL | 0 refills | Status: DC | PRN
  Filled 2024-02-01: qty 60, 20d supply, fill #0

## 2024-02-01 NOTE — Telephone Encounter (Signed)
 She is already on several pain medications: cymbalta  and gabapentin . Unable to prescribe any additional medications at this time. Will try muscle relaxant.

## 2024-02-02 ENCOUNTER — Other Ambulatory Visit: Payer: Self-pay

## 2024-02-02 NOTE — Telephone Encounter (Signed)
 Reached out to pt to go over provider response pt is aware and doesn't have any questions or concerns

## 2024-02-03 ENCOUNTER — Other Ambulatory Visit: Payer: Self-pay

## 2024-02-03 NOTE — Telephone Encounter (Signed)
 Requested medication (s) are due for refill today: na   Requested medication (s) are on the active medication list: yes   Last refill:  01/05/24 #21 0 refills   Future visit scheduled: no   Notes to clinic:  no refills remain. [facility] . Do you want to refill Rx?     Requested Prescriptions  Pending Prescriptions Disp Refills   estrogens , conjugated, (PREMARIN ) 0.3 MG tablet 21 tablet 0    Sig: Take 1 tablet (0.3 mg total) by mouth daily. Take daily for 21 days then do not take for 7 days.     OB/GYN:  Estrogens  Failed - 02/03/2024  8:07 AM      Failed - Mammogram is up-to-date per Health Maintenance      Passed - Last BP in normal range    BP Readings from Last 1 Encounters:  01/02/24 114/73         Passed - Valid encounter within last 12 months    Recent Outpatient Visits           1 month ago Type 2 diabetes mellitus without complication, without long-term current use of insulin (HCC)   [place] [facility] Marius Siemens, NP   7 months ago Type 2 diabetes mellitus without complication, without long-term current use of insulin (HCC)   [place] [facility] Marius Siemens, NP   1 year ago Need for immunization against influenza   [place] [facility] Marius Siemens, NP   1 year ago Essential hypertension   [place] [facility] Marius Siemens, NP   2 years ago Alcohol-induced acute pancreatitis without infection or necrosis   [place] [facility] Marius Siemens, NP

## 2024-02-05 ENCOUNTER — Other Ambulatory Visit: Payer: Self-pay

## 2024-02-16 ENCOUNTER — Telehealth (HOSPITAL_COMMUNITY): Payer: Self-pay

## 2024-02-16 NOTE — Telephone Encounter (Unsigned)
 Copied from CRM [id number]. Topic: Clinical - Medication Refill >> Feb 16, 2024  4:05 PM Fredrica W wrote: Medication: estrogens , conjugated, (PREMARIN ) 0.3 MG tablet - pt states it was working good would like more   Has the patient contacted their pharmacy? No (Agent: If no, request that the patient contact the pharmacy for the refill. If patient does not wish to contact the pharmacy document the reason why and proceed with request.) (Agent: If yes, when and what did the pharmacy advise?)  This is the patient's preferred pharmacy:  [facility] [address] [place] [address] Phone: [phone] Fax: [phone]  Is this the correct pharmacy for this prescription? Yes If no, delete pharmacy and type the correct one.   Has the prescription been filled recently? Yes  Is the patient out of the medication? Yes - 2 days since last dose  Has the patient been seen for an appointment in the last year OR does the patient have an upcoming appointment? Yes  Can we respond through MyChart? No  Agent: Please be advised that Rx refills may take up to 3 business days. We ask that you follow-up with your pharmacy.

## 2024-02-17 NOTE — Telephone Encounter (Signed)
 Tried to reach out to patient twice no phone call returned.   I will try to get more info out of the pt if possible, but will route this to most recently seen provider in case she happens to reach her.    JNL, CMA

## 2024-02-18 ENCOUNTER — Other Ambulatory Visit (INDEPENDENT_AMBULATORY_CARE_PROVIDER_SITE_OTHER): Payer: Self-pay | Admitting: Primary Care

## 2024-02-18 ENCOUNTER — Other Ambulatory Visit: Payer: Self-pay

## 2024-02-18 DIAGNOSIS — N951 Menopausal and female climacteric states: Secondary | ICD-10-CM

## 2024-02-19 ENCOUNTER — Other Ambulatory Visit: Payer: Self-pay

## 2024-02-19 MED ORDER — ESTROGENS CONJUGATED 0.3 MG PO TABS
0.3000 mg | ORAL_TABLET | Freq: Every day | ORAL | 0 refills | Status: DC
  Filled 2024-02-19 – 2024-03-09 (×2): qty 21, 21d supply, fill #0

## 2024-02-19 NOTE — Telephone Encounter (Signed)
 Will forward to provider

## 2024-02-20 NOTE — Telephone Encounter (Signed)
 I am unable to reach pt as well, will attempt to f/u at her appt next week. Thanks

## 2024-02-27 ENCOUNTER — Encounter (HOSPITAL_COMMUNITY): Admitting: Student in an Organized Health Care Education/Training Program

## 2024-03-01 ENCOUNTER — Other Ambulatory Visit: Payer: Self-pay

## 2024-03-02 ENCOUNTER — Other Ambulatory Visit: Payer: Self-pay

## 2024-03-08 ENCOUNTER — Other Ambulatory Visit: Payer: Self-pay

## 2024-03-09 ENCOUNTER — Other Ambulatory Visit: Payer: Self-pay

## 2024-03-12 ENCOUNTER — Other Ambulatory Visit: Payer: Self-pay

## 2024-03-18 ENCOUNTER — Other Ambulatory Visit: Payer: Self-pay

## 2024-04-06 ENCOUNTER — Ambulatory Visit (INDEPENDENT_AMBULATORY_CARE_PROVIDER_SITE_OTHER): Admitting: Primary Care

## 2024-04-07 ENCOUNTER — Emergency Department (HOSPITAL_COMMUNITY)
Admission: EM | Admit: 2024-04-07 | Discharge: 2024-04-07 | Disposition: A | Discharge status: Home / Self Care | Payer: MEDICAID | Attending: Emergency Medicine | Admitting: Emergency Medicine

## 2024-04-07 ENCOUNTER — Encounter (HOSPITAL_COMMUNITY): Payer: Self-pay

## 2024-04-07 ENCOUNTER — Other Ambulatory Visit: Payer: Self-pay

## 2024-04-07 ENCOUNTER — Emergency Department (HOSPITAL_COMMUNITY): Payer: MEDICAID

## 2024-04-07 DIAGNOSIS — I10 Essential (primary) hypertension: Secondary | ICD-10-CM | POA: Diagnosis not present

## 2024-04-07 DIAGNOSIS — R0789 Other chest pain: Secondary | ICD-10-CM | POA: Diagnosis present

## 2024-04-07 DIAGNOSIS — D649 Anemia, unspecified: Secondary | ICD-10-CM | POA: Diagnosis not present

## 2024-04-07 DIAGNOSIS — Z79899 Other long term (current) drug therapy: Secondary | ICD-10-CM | POA: Diagnosis not present

## 2024-04-07 DIAGNOSIS — Z7984 Long term (current) use of oral hypoglycemic drugs: Secondary | ICD-10-CM | POA: Diagnosis not present

## 2024-04-07 DIAGNOSIS — E876 Hypokalemia: Secondary | ICD-10-CM | POA: Diagnosis not present

## 2024-04-07 DIAGNOSIS — R079 Chest pain, unspecified: Secondary | ICD-10-CM

## 2024-04-07 LAB — CBC
HCT: 33.6 % — ABNORMAL LOW (ref 36.0–46.0)
Hemoglobin: 11.7 g/dL — ABNORMAL LOW (ref 12.0–15.0)
MCH: 27.1 pg (ref 26.0–34.0)
MCHC: 34.8 g/dL (ref 30.0–36.0)
MCV: 78 fL — ABNORMAL LOW (ref 80.0–100.0)
Platelets: 256 K/uL (ref 150–400)
RBC: 4.31 MIL/uL (ref 3.87–5.11)
RDW: 14.5 % (ref 11.5–15.5)
WBC: 7.3 K/uL (ref 4.0–10.5)
nRBC: 0 % (ref 0.0–0.2)

## 2024-04-07 LAB — BASIC METABOLIC PANEL WITH GFR
Anion gap: 11 (ref 5–15)
BUN: 15 mg/dL (ref 8–23)
CO2: 21 mmol/L — ABNORMAL LOW (ref 22–32)
Calcium: 9.2 mg/dL (ref 8.9–10.3)
Chloride: 110 mmol/L (ref 98–111)
Creatinine, Ser: 0.79 mg/dL (ref 0.44–1.00)
GFR, Estimated: >60 mL/min (ref >60)
Glucose, Bld: 151 mg/dL — ABNORMAL HIGH (ref 70–99)
Potassium: 3.3 mmol/L — ABNORMAL LOW (ref 3.5–5.1)
Sodium: 142 mmol/L (ref 135–145)

## 2024-04-07 LAB — TROPONIN I (HIGH SENSITIVITY)
Troponin I (High Sensitivity): 3 ng/L (ref <18)
Troponin I (High Sensitivity): 4 ng/L

## 2024-04-07 MED ORDER — POTASSIUM CHLORIDE CRYS ER 20 MEQ PO TBCR
40.0000 meq | EXTENDED_RELEASE_TABLET | Freq: Once | ORAL | Status: AC
  Administered 2024-04-07: 40 meq via ORAL
  Filled 2024-04-07: qty 2

## 2024-04-07 NOTE — ED Triage Notes (Signed)
 Pt reports with sharp chest pains that come and go for several months. Pt's son died from a heart attack recently and she is worried about the pains. Pt reports having a headache x 3 weeks.

## 2024-04-07 NOTE — ED Provider Notes (Signed)
 [place] EMERGENCY DEPARTMENT AT [facility] Provider Note   CSN: [id number] Arrival date & time: 04/07/24  1344     Patient presents with: Chest Pain   Angel Hughes is a 63 y.o. female presents today with history of sharp chest pain that is been intermittent for the last several months however she has noticed that over the last 24 to 48 hours she has had increased pain that has been present in the center of her chest and in the left side of her chest.  She denies any radiation, denies any shortness of breath, has not had any nausea or vomiting.  Her primary concern for this is that her son was recently found suddenly deceased from heart disease, as she is afraid that this may also be heart disease since he has had chest pain and she does not want to ignore this.  She has a previous medical diagnosis of generalized anxiety along with bipolar disorder and depression.  Further she has a history of hypertension that is managed with Norvasc .    Chest Pain      Prior to Admission medications   Medication Sig Start Date End Date Taking? Authorizing Provider  amLODipine  (NORVASC ) 10 MG tablet Take 1 tablet (10 mg total) by mouth daily. 01/02/24   Celestia Rosaline SQUIBB, NP  BD [employer] MISC Place used needles and syringes in sharps container 11/15/20   Georjean Darice HERO, MD  Cyanocobalamin  (VITAMIN DEFICIENCY SYSTEM-B12) 1000 MCG/ML KIT 1000 mcg daily for 1 week, then weekly for 1 month, then monthly for a year 11/15/20   Georjean Darice HERO, MD  DULoxetine  (CYMBALTA ) 30 MG capsule Take 3 capsules (90 mg total) by mouth daily. 01/16/24   McQuilla, Jai B, MD  ergocalciferol  (VITAMIN D2) 1.25 MG (50000 UT) capsule Take 1 capsule (50,000 Units total) by mouth once a week. 06/25/23   Celestia Rosaline SQUIBB, NP  estrogens , conjugated, (PREMARIN ) 0.3 MG tablet Take 1 tablet (0.3 mg total) by mouth daily. Take daily for 21 days then do not take for 7 days. 02/19/24   Celestia Rosaline SQUIBB, NP   gabapentin  (NEURONTIN ) 300 MG capsule Take 1 capsule (300 mg total) by mouth 3 (three) times daily. 01/16/24   McQuilla, Jai B, MD  hydrOXYzine  (VISTARIL ) 50 MG capsule Take 1 capsule (50 mg total) by mouth 3 (three) times daily as needed. 01/16/24   McQuilla, Jai B, MD  metFORMIN  (GLUCOPHAGE -XR) 500 MG 24 hr tablet Take 1 tablet (500 mg total) by mouth daily with breakfast. 01/02/24   Celestia Rosaline SQUIBB, NP  methocarbamol  (ROBAXIN ) 500 MG tablet Take 1 tablet (500 mg total) by mouth every 8 (eight) hours as needed for muscle spasms. 02/01/24   Fleming, Zelda W, NP  naloxone  (NARCAN ) nasal spray 4 mg/0.1 mL Use as directed for opiate overdose 12/06/23   Midge Golas, MD  propranolol  (INDERAL ) 20 MG tablet Take 1 tablet (20 mg total) by mouth 2 (two) times daily as needed for panic,agitation,sleep, and anxiety. 06/12/22   Celestia Rosaline SQUIBB, NP  rosuvastatin  (CRESTOR ) 20 MG tablet Take 1 tablet (20 mg total) by mouth daily. 10/08/23   Celestia Rosaline SQUIBB, NP  SYRINGE-NEEDLE, DISP, 3 ML 22G X 1-1/2 3 ML MISC Use a new needle and syringe for each injection. 11/15/20   Georjean Darice HERO, MD  topiramate  (TOPAMAX ) 100 MG tablet Take 1 tablet (100 mg total) by mouth daily. 01/16/24   McQuilla, Jai B, MD  traZODone  (DESYREL ) 100 MG tablet  Take 1 tablet (100 mg total) by mouth at bedtime. 01/16/24   McQuilla, Jai B, MD    Allergies: Patient has no known allergies.    Review of Systems  Cardiovascular:  Positive for chest pain.  All other systems reviewed and are negative.   Updated Vital Signs BP 107/69 (BP Location: Right Arm)   Pulse 68   Temp 98.2 F (36.8 C) (Oral)   Resp 13   Ht 5' 3 (1.6 m)   Wt 76.2 kg   SpO2 96%   BMI 29.76 kg/m   Physical Exam Vitals and nursing note reviewed.  Constitutional:      General: She is not in acute distress.    Appearance: Normal appearance.  HENT:     Head: Normocephalic and atraumatic.     Mouth/Throat:     Mouth: Mucous membranes are moist.      Pharynx: Oropharynx is clear.  Eyes:     Extraocular Movements: Extraocular movements intact.     Conjunctiva/sclera: Conjunctivae normal.     Pupils: Pupils are equal, round, and reactive to light.  Cardiovascular:     Rate and Rhythm: Normal rate and regular rhythm. No extrasystoles are present.    Pulses: Normal pulses.     Heart sounds: Normal heart sounds, S1 normal and S2 normal. No murmur heard.    No friction rub. No gallop.  Pulmonary:     Effort: Pulmonary effort is normal.     Breath sounds: Normal breath sounds and air entry.  Abdominal:     General: Abdomen is flat. Bowel sounds are normal.     Palpations: Abdomen is soft.  Musculoskeletal:        General: Normal range of motion.     Cervical back: Normal range of motion and neck supple.     Right lower leg: No edema.     Left lower leg: No edema.  Skin:    General: Skin is warm and dry.     Capillary Refill: Capillary refill takes less than 2 seconds.  Neurological:     General: No focal deficit present.     Mental Status: She is alert. Mental status is at baseline.  Psychiatric:        Mood and Affect: Mood normal.     (all labs ordered are listed, but only abnormal results are displayed) Labs Reviewed  BASIC METABOLIC PANEL WITH GFR - Abnormal; Notable for the following components:      Result Value   Potassium 3.3 (*)    CO2 21 (*)    Glucose, Bld 151 (*)    All other components within normal limits  CBC - Abnormal; Notable for the following components:   Hemoglobin 11.7 (*)    HCT 33.6 (*)    MCV 78.0 (*)    All other components within normal limits  TROPONIN I (HIGH SENSITIVITY)  TROPONIN I (HIGH SENSITIVITY)    EKG: None  Radiology: DG Chest 2 View Result Date: 04/07/2024 CLINICAL DATA:  Chest pain EXAM: CHEST - 2 VIEW COMPARISON:  12/05/2023 FINDINGS: The heart size and mediastinal contours are within normal limits. Both lungs are clear. The visualized skeletal structures are unremarkable.  IMPRESSION: No active cardiopulmonary disease. Electronically Signed   By: Luke Bun M.D.   On: 04/07/2024 15:18     Procedures   Medications Ordered in the ED  potassium chloride  SA (KLOR-CON  M) CR tablet 40 mEq (40 mEq Oral Given 04/07/24 1923)    Clinical Course  as of 04/07/24 2016  Wed Apr 07, 2024  2015 Troponin I (High Sensitivity) Noted unchanged repeat troponin. [JG]  2015 Potassium(!): 3.3 Noted, was provided with potassium tablet for repletion. [JG]  2016 Hemoglobin(!): 11.7 Noted, this is consistent with previous lab values from 4 months prior. [JG]    Clinical Course User Index [JG] Myriam Dorn BROCKS, PA                                 Medical Decision Making Amount and/or Complexity of Data Reviewed Labs: ordered. Radiology: ordered.  Risk Prescription drug management.   Medical Decision Making:   Angel Hughes is a 63 y.o. female who presented to the ED today with left-sided chest pain detailed above.     Complete initial physical exam performed, notably the patient  was alert and oriented no apparent distress.  Pulmonary and cardiac auscultation are unremarkable and remainder physical exam was unremarkable.  Chest is nontender to palpation.    Reviewed and confirmed nursing documentation for past medical history, family history, social history.    Initial Assessment:   With the patient's presentation of left-sided chest discomfort, differential diagnosis includes ACS, pneumonia, pneumothorax, pulmonary embolus   Initial Plan:   Screening labs including CBC and Metabolic panel to evaluate for infectious or metabolic etiology of disease.  CXR to evaluate for structural/infectious intrathoracic pathology.  EKG and serial troponin to evaluate for cardiac pathology. Objective evaluation as below reviewed   Initial Study Results:   Laboratory  All laboratory results reviewed without evidence of clinically relevant pathology.   Exceptions include:  Potassium 3.3  EKG EKG was reviewed independently. Rate, rhythm, axis, intervals all examined and without medically relevant abnormality. ST segments without concerns for elevations.    Radiology:  All images reviewed independently. Agree with radiology report at this time.   DG Chest 2 View Result Date: 04/07/2024 CLINICAL DATA:  Chest pain EXAM: CHEST - 2 VIEW COMPARISON:  12/05/2023 FINDINGS: The heart size and mediastinal contours are within normal limits. Both lungs are clear. The visualized skeletal structures are unremarkable. IMPRESSION: No active cardiopulmonary disease. Electronically Signed   By: Luke Bun M.D.   On: 04/07/2024 15:18    Reassessment and Plan:   Reassessed patient, she does not have any further chest pain, review of her labs shows that there is no change in her troponin and they both remain within normal limits.  She did have a mild hypokalemia at 3.3 which she was provided with oral potassium repletion.  Hemoglobin and hematocrit are reduced however in comparison to labs from 4 months ago this is baseline.  Review of chest x-ray did not show any acute abnormalities and EKG was unremarkable.  As such, do not believe patient has acute coronary syndrome at this time, also does not appear to have any acute cardiopulmonary disease at this time.  As such, will discharge patient with follow-up to be made with primary care within the next 1 to 2 weeks.  This was explained to the patient, she understands and agrees and has no further concerns at this time.       Final diagnoses:  None    ED Discharge Orders     None          Myriam Dorn BROCKS, [place] 04/07/24 2021    Yolande Lamar BROCKS, MD 04/08/24 [phone]

## 2024-04-12 NOTE — Progress Notes (Addendum)
 [facility] MD/PA/NP OP Progress Note  Angel Hughes  MRN:  [id number]  Assessment and Plan: Discussed with patient that insomnia could be rebound 2/2 to cessation of Seroquel  and Etoh. Pt does not endorse manic behaviors, will add at bedtime dose of gabapentin  and continue to recommend sobriety from Etoh. Did discuss melatonin with pt, but pt not interested at this time. Pt vitals and mood appear improved with sobriety. Topamax  appears to be beneficial for cravings.   EKG 01-13-2024- QTC 508 and HR 102  Bipolar disorder versus substance-induced mood disorder EtOH use disorder Grief GAD Cannabis use d/o, moderate - Continue trazodone  100mg  at bedtime  - Continue Cymbalta  to 90 mg daily - Continue Topomax 100mg  daily -- Increase gabapentin  300mg  tid -- Continue Hydroxyzine  50mg  TID PRN  Chief Complaint:  No chief complaint on file.  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   Patient seen ***.  Patient reports feeling *** today. Since the previous visit, ***. Regarding psychiatric symptoms, ***. Regarding medications, patient notes ***. Patient reports the following adverse effects: ***. Patient reports *** sleep, ***. Patient reports *** appetite, ***. Patient rates anxiety a ***/10, depression a ***/10, and anger a ***/10. Patient denies current SI, HI, and AVH. ***   Life updates: *** Stressors include ***.   Substance use: *** Etoh:none THC: 1 blunt/day Cigs: 0.5ppd Caffeine: no    Visit Diagnosis:  No diagnosis found.   Past Psychiatric History: Dx-Bipolar dx, SIMD, Etoh use d/o, GAD INPT: Negative OPT: Dr. Harl and Merrily Previous meds: Prozac, Paxil, Geodon NO SA   Trauma: Patient found her son deceased in his bed in 03-15-23.   Past Medical History:  Past Medical History:  Diagnosis Date   Allergic rhinitis    Anemia     Anxiety    Arthritis    Bipolar disorder (HCC)    Depression    GERD (gastroesophageal reflux disease)    Hyperlipemia    Hypertension    pt states that she has no hx of elevated BP, on med for hot flashes   Obesity     Past Surgical History:  Procedure Laterality Date   ABDOMINAL HYSTERECTOMY     CESAREAN SECTION     WRIST SURGERY     cyst   Z-PLASTY REPAIR      Family Psychiatric History: Mom: Some psych hx but unknown specifics Dad: Etoh use disorder   Denies hx of SA Found son dead in bed Jan 13, 2023  Family History:  Family History  Problem Relation Age of Onset   Diabetes Paternal Grandmother    Hypertension Paternal Grandmother    Diabetes Maternal Grandmother    Hypertension Maternal Grandmother    Diabetes Mother    Hypertension Mother    Diabetes Brother    Diabetes Sister    Heart disease Sister    Hypertension Sister     Social History:  Living:  Currently living with her best friends mom Occupation: *** Relationship: *** Children: *** Support: *** Legal History: ***  Social History   Socioeconomic History   Marital status: Widowed    Spouse name: Not on file   Number of children: Not on file   Years of education: Not on file   Highest education level: Not on file  Occupational History   Not on file  Tobacco Use  Smoking status: Every Day    Current packs/day: 0.50    Average packs/day: 0.5 packs/day for 34.0 years (17.0 ttl pk-yrs)    Types: Cigarettes   Smokeless tobacco: Never  Vaping Use   Vaping status: Never Used  Substance and Sexual Activity   Alcohol use: Yes    Alcohol/week: 0.0 standard drinks of alcohol    Comment: occassionally   Drug use: Yes    Types: Marijuana   Sexual activity: Not on file  Other Topics Concern   Not on file  Social History Narrative   Right handed    Lives alone    Social Drivers of Health   Financial Resource Strain: Not on file  Food Insecurity: Not on file  Transportation Needs: Not on  file  Physical Activity: Not on file  Stress: Not on file  Social Connections: Not on file   Allergies: No Known Allergies  Metabolic Disorder Labs: Lab Results  Component Value Date   HGBA1C 6.9 06/24/2023   MPG 126 (H) 06/01/2013   No results found for: PROLACTIN Lab Results  Component Value Date   CHOL 168 06/24/2023   TRIG 93 06/24/2023   HDL 64 06/24/2023   CHOLHDL 2.6 06/24/2023   VLDL 27 06/01/2013   LDLCALC 87 06/24/2023   LDLCALC 68 08/01/2022   Lab Results  Component Value Date   TSH 0.86 10/23/2020   TSH 0.694 02/19/2010    Therapeutic Level Labs: No results found for: LITHIUM No results found for: VALPROATE No results found for: CBMZ  Current Medications: Current Outpatient Medications  Medication Sig Dispense Refill   amLODipine  (NORVASC ) 10 MG tablet Take 1 tablet (10 mg total) by mouth daily. 90 tablet 1   BD [employer] MISC Place used needles and syringes in sharps container 1 each 0   Cyanocobalamin  (VITAMIN DEFICIENCY SYSTEM-B12) 1000 MCG/ML KIT 1000 mcg daily for 1 week, then weekly for 1 month, then monthly for a year 1 kit 0   DULoxetine  (CYMBALTA ) 30 MG capsule Take 3 capsules (90 mg total) by mouth daily. 90 capsule 1   ergocalciferol  (VITAMIN D2) 1.25 MG (50000 UT) capsule Take 1 capsule (50,000 Units total) by mouth once a week. 8 capsule 0   estrogens , conjugated, (PREMARIN ) 0.3 MG tablet Take 1 tablet (0.3 mg total) by mouth daily. Take daily for 21 days then do not take for 7 days. 21 tablet 0   gabapentin  (NEURONTIN ) 300 MG capsule Take 1 capsule (300 mg total) by mouth 3 (three) times daily. 90 capsule 1   hydrOXYzine  (VISTARIL ) 50 MG capsule Take 1 capsule (50 mg total) by mouth 3 (three) times daily as needed. 120 capsule 1   metFORMIN  (GLUCOPHAGE -XR) 500 MG 24 hr tablet Take 1 tablet (500 mg total) by mouth daily with breakfast. 90 tablet 1   methocarbamol  (ROBAXIN ) 500 MG tablet Take 1 tablet (500 mg total) by mouth  every 8 (eight) hours as needed for muscle spasms. 60 tablet 0   naloxone  (NARCAN ) nasal spray 4 mg/0.1 mL Use as directed for opiate overdose 2 each 1   propranolol  (INDERAL ) 20 MG tablet Take 1 tablet (20 mg total) by mouth 2 (two) times daily as needed for panic,agitation,sleep, and anxiety. 60 tablet 3   rosuvastatin  (CRESTOR ) 20 MG tablet Take 1 tablet (20 mg total) by mouth daily. 90 tablet 3   SYRINGE-NEEDLE, DISP, 3 ML 22G X 1-1/2 3 ML MISC Use a new needle and syringe for each injection. 50 each  0   topiramate  (TOPAMAX ) 100 MG tablet Take 1 tablet (100 mg total) by mouth daily. 90 tablet 1   traZODone  (DESYREL ) 100 MG tablet Take 1 tablet (100 mg total) by mouth at bedtime. 30 tablet 1   No current facility-administered medications for this visit.    Psychiatric Specialty Exam:  General Appearance: appears at stated age, casually dressed and groomed ***  Behavior: pleasant and cooperative ***  Psychomotor Activity: no psychomotor agitation or retardation noted ***  Eye Contact: fair *** Speech: normal amount, volume and fluency ***   Mood: euthymic *** Affect: congruent, pleasant and interactive ***  Thought Process: linear, goal directed, no circumstantial or tangential thought process noted, no racing thoughts or flight of ideas *** Descriptions of Associations: intact ***  Thought Content Hallucinations: denies AH, VH , does not appear responding to stimuli *** Delusions: no paranoia, delusions of control, grandeur, ideas of reference, thought broadcasting, and magical thinking *** Suicidal Thoughts: denies SI, intention, plan *** Homicidal Thoughts: denies HI, intention, plan ***  Alertness/Orientation: alert and fully oriented ***  Insight: fair*** Judgment: fair***  Memory: intact ***  Executive Functions  Concentration: intact *** Attention Span: fair *** Recall: intact *** Fund of Knowledge: fair ***  Physical Exam *** General: Pleasant,  well-appearing ***. No acute distress. Pulmonary: Normal effort. No wheezing or rales. Skin: No obvious rash or lesions. Neuro: A&Ox3.No focal deficit.  Review of Systems *** No reported symptoms   Screenings: CAGE-AID    Flowsheet Row Clinical Support from 08/08/2022 in [facility]  CAGE-AID Score 3   GAD-7    Flowsheet Row Office Visit from 06/24/2023 in [facility] Clinical Support from 03/13/2023 in [facility] Office Visit from 08/01/2022 in [facility] Office Visit from 01/10/2022 in [facility] Clinical Support from 07/25/2021 in [facility]  Total GAD-7 Score 11 16 12 15 15    PHQ2-9    Flowsheet Row Office Visit from 06/24/2023 in [facility] Clinical Support from 03/13/2023 in [facility] Office Visit from 08/01/2022 in [facility] Office Visit from 01/10/2022 in [facility] Clinical Support from 07/25/2021 in [place]  PHQ-2 Total Score 6 5 4 5 2   PHQ-9 Total Score 13 16 13 15 10    Flowsheet Row ED from 04/07/2024 in [facility] at [place] [facility] ED from 12/05/2023 in [facility] Department at [facility] Clinical Support from 01/17/2021 in [facility]  C-SSRS RISK CATEGORY No Risk No Risk No Risk    Ismael Franco, MD PGY-3 Psychiatry Resident

## 2024-04-13 ENCOUNTER — Ambulatory Visit (INDEPENDENT_AMBULATORY_CARE_PROVIDER_SITE_OTHER): Payer: MEDICAID | Admitting: Psychiatry

## 2024-04-13 ENCOUNTER — Other Ambulatory Visit: Payer: Self-pay

## 2024-04-13 VITALS — BP 114/80 | Wt 156.0 lb

## 2024-04-13 DIAGNOSIS — F411 Generalized anxiety disorder: Secondary | ICD-10-CM | POA: Diagnosis not present

## 2024-04-13 DIAGNOSIS — F1020 Alcohol dependence, uncomplicated: Secondary | ICD-10-CM | POA: Diagnosis not present

## 2024-04-13 DIAGNOSIS — F1994 Other psychoactive substance use, unspecified with psychoactive substance-induced mood disorder: Secondary | ICD-10-CM

## 2024-04-13 DIAGNOSIS — F331 Major depressive disorder, recurrent, moderate: Secondary | ICD-10-CM

## 2024-04-13 MED ORDER — GABAPENTIN 300 MG PO CAPS
300.0000 mg | ORAL_CAPSULE | Freq: Three times a day (TID) | ORAL | 1 refills | Status: DC
  Filled 2024-04-13 – 2024-04-26 (×2): qty 90, 30d supply, fill #0
  Filled 2024-07-05 – 2024-07-13 (×3): qty 90, 30d supply, fill #1

## 2024-04-13 MED ORDER — HYDROXYZINE PAMOATE 50 MG PO CAPS
50.0000 mg | ORAL_CAPSULE | Freq: Three times a day (TID) | ORAL | 0 refills | Status: DC | PRN
  Filled 2024-04-13 – 2024-04-26 (×2): qty 90, 30d supply, fill #0
  Filled 2024-06-21: qty 90, 30d supply, fill #1

## 2024-04-13 MED ORDER — TRAZODONE HCL 100 MG PO TABS
100.0000 mg | ORAL_TABLET | Freq: Every day | ORAL | 1 refills | Status: DC
  Filled 2024-04-13: qty 30, 30d supply, fill #0

## 2024-04-13 MED ORDER — DULOXETINE HCL 30 MG PO CPEP
90.0000 mg | ORAL_CAPSULE | Freq: Every day | ORAL | 1 refills | Status: DC
  Filled 2024-04-13 – 2024-04-26 (×2): qty 90, 30d supply, fill #0
  Filled 2024-06-21: qty 90, 30d supply, fill #1

## 2024-04-13 MED ORDER — GABAPENTIN 300 MG PO CAPS
300.0000 mg | ORAL_CAPSULE | Freq: Every day | ORAL | 1 refills | Status: DC
  Filled 2024-04-13 – 2024-06-21 (×2): qty 30, 30d supply, fill #0
  Filled 2024-07-05: qty 30, 30d supply, fill #1

## 2024-04-13 NOTE — Addendum Note (Signed)
 Addended by: IZELLA CARAWAY B on: 04/13/2024 12:14 PM   Modules accepted: Orders

## 2024-04-13 NOTE — Addendum Note (Signed)
 Addended by: IZELLA CARAWAY B on: 04/13/2024 01:43 PM   Modules accepted: Orders

## 2024-04-13 NOTE — Patient Instructions (Signed)
 Thank you for attending your appointment today.  -- trazodone  for sleep -- cymbalta  take all in the morning for depression -- stop topamax  -- continue gabapentin  for anxiety and cravings for substances -- continue hydroxyzine  for anxiety  -- We did not make any medication changes today. Please continue medications as prescribed.  Please do not make any changes to medications without first discussing with your provider. If you are experiencing a psychiatric emergency, please call 911 or present to your nearest emergency department. Additional crisis, medication management, and therapy resources are included below.  [facility]  [address] [place], [place] [address] [phone] WALK-IN URGENT CARE 24/7 FOR ANYONE [address], [place], [place]  [phone] Fax: [phone] [email or web address] *Interpreters available *Accepts all insurance and uninsured for Urgent Care needs *Accepts Medicaid and uninsured for outpatient treatment (below)      ONLY FOR [facility]  Below:    Outpatient New Patient Assessment/Therapy Walk-ins:        Monday, Wednesday, and Thursday 8am until slots are full (first come, first served)                   New Patient Psychiatry/Medication Management        Monday-Friday 8am-11am (first come, first served)               For all walk-ins we ask that you arrive by 7:15am, because patients will be seen in the order of arrival.

## 2024-04-15 ENCOUNTER — Telehealth (INDEPENDENT_AMBULATORY_CARE_PROVIDER_SITE_OTHER): Payer: Self-pay | Admitting: Primary Care

## 2024-04-15 ENCOUNTER — Ambulatory Visit (INDEPENDENT_AMBULATORY_CARE_PROVIDER_SITE_OTHER): Payer: MEDICAID | Admitting: Primary Care

## 2024-04-15 ENCOUNTER — Encounter (INDEPENDENT_AMBULATORY_CARE_PROVIDER_SITE_OTHER): Payer: Self-pay | Admitting: Primary Care

## 2024-04-15 NOTE — Telephone Encounter (Signed)
 Called pt to confirm appt 7/18

## 2024-04-16 ENCOUNTER — Ambulatory Visit (INDEPENDENT_AMBULATORY_CARE_PROVIDER_SITE_OTHER): Payer: MEDICAID | Admitting: Primary Care

## 2024-04-16 VITALS — BP 123/77 | HR 74 | Resp 16 | Ht 63.0 in | Wt 168.4 lb

## 2024-04-16 DIAGNOSIS — Z7984 Long term (current) use of oral hypoglycemic drugs: Secondary | ICD-10-CM

## 2024-04-16 DIAGNOSIS — E119 Type 2 diabetes mellitus without complications: Secondary | ICD-10-CM

## 2024-04-16 DIAGNOSIS — Z09 Encounter for follow-up examination after completed treatment for conditions other than malignant neoplasm: Secondary | ICD-10-CM

## 2024-04-16 DIAGNOSIS — Z79899 Other long term (current) drug therapy: Secondary | ICD-10-CM

## 2024-04-16 DIAGNOSIS — Z1231 Encounter for screening mammogram for malignant neoplasm of breast: Secondary | ICD-10-CM

## 2024-04-16 DIAGNOSIS — Z1211 Encounter for screening for malignant neoplasm of colon: Secondary | ICD-10-CM

## 2024-04-16 NOTE — Progress Notes (Signed)
 Subjective:   Angel Hughes is a 63 y.o. female presents for follow up.She presented sharp chest pain that is been intermittent for the last several months . New event  chest pain last 24 to 48 hours in the center of her chest and in the left side of her chest . Tx mild hypokalemia at 3.3  oral potassium repletion. EKG was unremarkable .  Today she comes in with her lively self with no complaints or concerns . Past Medical History:  Diagnosis Date   Allergic rhinitis    Anemia    Anxiety    Arthritis    Bipolar disorder (HCC)    Depression    GERD (gastroesophageal reflux disease)    Hyperlipemia    Hypertension    pt states that she has no hx of elevated BP, on med for hot flashes   Obesity      No Known Allergies  Current Outpatient Medications on File Prior to Visit  Medication Sig Dispense Refill   amLODipine  (NORVASC ) 10 MG tablet Take 1 tablet (10 mg total) by mouth daily. 90 tablet 1   DULoxetine  (CYMBALTA ) 30 MG capsule Take 3 capsules (90 mg total) by mouth daily. 90 capsule 1   estrogens , conjugated, (PREMARIN ) 0.3 MG tablet Take 1 tablet (0.3 mg total) by mouth daily. Take daily for 21 days then do not take for 7 days. 21 tablet 0   gabapentin  (NEURONTIN ) 300 MG capsule Take 1 capsule (300 mg total) by mouth 3 (three) times daily. 90 capsule 1   gabapentin  (NEURONTIN ) 300 MG capsule Take 1 capsule (300 mg total) by mouth at bedtime. 30 capsule 1   hydrOXYzine  (VISTARIL ) 50 MG capsule Take 1 capsule (50 mg total) by mouth 3 (three) times daily as needed. 180 capsule 0   metFORMIN  (GLUCOPHAGE -XR) 500 MG 24 hr tablet Take 1 tablet (500 mg total) by mouth daily with breakfast. 90 tablet 1   propranolol  (INDERAL ) 20 MG tablet Take 1 tablet (20 mg total) by mouth 2 (two) times daily as needed for panic,agitation,sleep, and anxiety. 60 tablet 3   rosuvastatin  (CRESTOR ) 20 MG tablet Take 1 tablet (20 mg total) by mouth daily. 90 tablet 3   BD [employer] MISC Place used  needles and syringes in sharps container (Patient not taking: Reported on 04/16/2024) 1 each 0   Cyanocobalamin  (VITAMIN DEFICIENCY SYSTEM-B12) 1000 MCG/ML KIT 1000 mcg daily for 1 week, then weekly for 1 month, then monthly for a year (Patient not taking: Reported on 04/16/2024) 1 kit 0   ergocalciferol  (VITAMIN D2) 1.25 MG (50000 UT) capsule Take 1 capsule (50,000 Units total) by mouth once a week. (Patient not taking: Reported on 04/16/2024) 8 capsule 0   methocarbamol  (ROBAXIN ) 500 MG tablet Take 1 tablet (500 mg total) by mouth every 8 (eight) hours as needed for muscle spasms. (Patient not taking: Reported on 04/16/2024) 60 tablet 0   naloxone  (NARCAN ) nasal spray 4 mg/0.1 mL Use as directed for opiate overdose (Patient not taking: Reported on 04/16/2024) 2 each 1   SYRINGE-NEEDLE, DISP, 3 ML 22G X 1-1/2 3 ML MISC Use a new needle and syringe for each injection. (Patient not taking: Reported on 04/16/2024) 50 each 0   No current facility-administered medications on file prior to visit.    Review of System: ROS Comprehensive ROS Pertinent positive and negative noted in HPI   Objective:  BP 123/77   Pulse 74   Resp 16   Ht 5' 3 (  1.6 m)   Wt 168 lb 6.4 oz (76.4 kg)   SpO2 95%   BMI 29.83 kg/m   Filed Weights   04/16/24 1125  Weight: 168 lb 6.4 oz (76.4 kg)    Physical Exam Vitals reviewed.  Constitutional:      Appearance: Normal appearance.  HENT:     Head: Normocephalic.     Right Ear: Tympanic membrane, ear canal and external ear normal.     Left Ear: Tympanic membrane, ear canal and external ear normal.     Nose: Nose normal.     Mouth/Throat:     Mouth: Mucous membranes are moist.  Eyes:     Extraocular Movements: Extraocular movements intact.     Pupils: Pupils are equal, round, and reactive to light.  Cardiovascular:     Rate and Rhythm: Normal rate and regular rhythm.  Pulmonary:     Effort: Pulmonary effort is normal.     Breath sounds: Normal breath sounds.   Abdominal:     General: Bowel sounds are normal.     Palpations: Abdomen is soft.  Musculoskeletal:        General: Normal range of motion.     Cervical back: Normal range of motion.  Skin:    General: Skin is warm and dry.  Neurological:     Mental Status: She is alert and oriented to person, place, and time.  Psychiatric:        Mood and Affect: Mood normal.        Behavior: Behavior normal.        Thought Content: Thought content normal.      Assessment:  Aerika was seen today for hospitalization follow-up.  Diagnoses and all orders for this visit:  Type 2 diabetes mellitus without complication, without long-term current use of insulin (HCC) -     Microalbumin / creatinine urine ratio  Colon cancer screening -     Ambulatory referral to Gastroenterology  Encounter for screening mammogram for malignant neoplasm of breast -     MM 3D SCREENING MAMMOGRAM BILATERAL BREAST W/IMPLANT; Future  Hospital discharge follow-up   See note - reviewed BH note ongoing marijuana use and occasional alcohol  medication changed  Type 2 diabetes mellitus without complication, without long-term current use of insulin (HCC) -     Microalbumin / creatinine urine ratio  Colon cancer screening -     Ambulatory referral to Gastroenterology  Encounter for screening mammogram for malignant neoplasm of breast -     MM 3D SCREENING MAMMOGRAM BILATERAL BREAST W/IMPLANT; Future    This note has been created with Education officer, environmental. Any transcriptional errors are unintentional.   Return in about 6 weeks (around 05/28/2024), or labs.BARBARA  Rosaline SHAUNNA Bohr, NP 04/19/2024, 2:57 PM

## 2024-04-17 LAB — MICROALBUMIN / CREATININE URINE RATIO
Creatinine, Urine: 76.1 mg/dL
Microalb/Creat Ratio: 22 mg/g{creat} (ref 0–29)
Microalbumin, Urine: 16.6 ug/mL

## 2024-04-19 ENCOUNTER — Ambulatory Visit: Payer: Self-pay | Admitting: Primary Care

## 2024-04-19 ENCOUNTER — Other Ambulatory Visit: Payer: Self-pay

## 2024-04-21 ENCOUNTER — Other Ambulatory Visit: Payer: Self-pay

## 2024-04-22 ENCOUNTER — Other Ambulatory Visit: Payer: Self-pay

## 2024-04-23 NOTE — Telephone Encounter (Signed)
 noted

## 2024-04-23 NOTE — Telephone Encounter (Signed)
 Copied from CRM [id number]. Topic: Clinical - Lab/Test Results >> Apr 23, 2024  3:21 PM [name] wrote: Reason for CRM:  I read Angel Hughes her lab results: Your kidney function is normal She had no questions Thanks

## 2024-04-26 ENCOUNTER — Other Ambulatory Visit (INDEPENDENT_AMBULATORY_CARE_PROVIDER_SITE_OTHER): Payer: Self-pay | Admitting: Primary Care

## 2024-04-26 ENCOUNTER — Other Ambulatory Visit: Payer: Self-pay

## 2024-04-26 DIAGNOSIS — N951 Menopausal and female climacteric states: Secondary | ICD-10-CM

## 2024-04-27 NOTE — Telephone Encounter (Signed)
 Requested medication (s) are due for refill today: yes  Requested medication (s) are on the active medication list: yes  Last refill:  02/19/24 #21  Future visit scheduled: yes  Notes to clinic:  NT routed back to provider for review   Requested Prescriptions  Pending Prescriptions Disp Refills   estrogens , conjugated, (PREMARIN ) 0.3 MG tablet 21 tablet 0    Sig: Take 1 tablet (0.3 mg total) by mouth daily. Take daily for 21 days then do not take for 7 days.     OB/GYN:  Estrogens  Failed - 04/27/2024  3:22 PM      Failed - Mammogram is up-to-date per Health Maintenance      Passed - Last BP in normal range    BP Readings from Last 1 Encounters:  04/16/24 123/77         Passed - Valid encounter within last 12 months    Recent Outpatient Visits           1 week ago Type 2 diabetes mellitus without complication, without long-term current use of insulin (HCC)   [place] [facility] Celestia Rosaline SQUIBB, NP   3 months ago Type 2 diabetes mellitus without complication, without long-term current use of insulin (HCC)   [place] [facility] Celestia Rosaline SQUIBB, NP   10 months ago Type 2 diabetes mellitus without complication, without long-term current use of insulin (HCC)   [place] [facility] Celestia Rosaline SQUIBB, NP   1 year ago Need for immunization against influenza   [place] [facility] Celestia Rosaline SQUIBB, NP   2 years ago Essential hypertension   [place] [facility] Celestia Rosaline SQUIBB, NP

## 2024-04-29 MED ORDER — ESTROGENS CONJUGATED 0.3 MG PO TABS
0.3000 mg | ORAL_TABLET | Freq: Every day | ORAL | 0 refills | Status: DC
  Filled 2024-04-29 – 2024-06-21 (×2): qty 21, 21d supply, fill #0

## 2024-04-30 ENCOUNTER — Other Ambulatory Visit: Payer: Self-pay

## 2024-05-10 ENCOUNTER — Other Ambulatory Visit: Payer: Self-pay

## 2024-05-11 ENCOUNTER — Other Ambulatory Visit: Payer: Self-pay

## 2024-05-24 NOTE — Progress Notes (Deleted)
 BH MD/PA/NP OP Progress Note  Angel Hughes  MRN:  [id number]  Assessment and Plan:   In the previous visit, we discussed that much of her presentation is due to her ongoing marijuana use and occasional alcohol use. Gabapentin  was increased and topamax  and trazodone  were discontinued as well. Patient was overusing her trazodone  and hydroxyzine . ***  Bipolar disorder versus substance-induced mood disorder EtOH use disorder Grief GAD Cannabis use d/o, moderate Prior medications: trazodone  (was overusing, was taking 4 tablets 100 mg nightly), topamax  (was meant to augment with Seroquel ) - Continue Cymbalta  90 mg daily - Increase gabapentin  to 300 mg qam, 300 mg qpm, and 600 qhs - Continue Hydroxyzine  50mg  TID PRN - Discuss obtained EKG with PCP  Chief Complaint:  No chief complaint on file.  HPI:  Patient seen ***.  Patient reports feeling *** today. Since the previous visit, ***. Regarding medications, patient notes ***. Patient reports the following adverse effects: ***.   Patient reports *** sleep, ***. Patient reports *** appetite, ***.   Patient denies current SI, HI, and AVH. ***  Stressors include ***.   Substance use: *** Etoh: yes, 1 20 oz of smirnoff cooler yesterday and prior a few months ago THC: 1 blunt/day Cigs: 0.5ppd Caffeine: no   Visit Diagnosis:  No diagnosis found.    Past Psychiatric History: Dx:Bipolar dx, SIMD, Etoh use d/o, GAD INPT: Negative OPT: Dr. Harl and Merrily Previous meds: Prozac, Paxil, Geodon, Topamax , Seroquel  NO SA   Trauma: Patient found her son deceased in his bed in 02-26-2023.   Family Psychiatric History: Mom: Some psych hx but unknown specifics Dad and brother: Etoh use disorder   Denies hx of SA Found son dead in bed 2022/12/27  Social History:  Living:  Currently living with her neice Occupation: unemployed Relationship: widowed Children: had one child Support: friends  Past Medical History:  Past Medical  History:  Diagnosis Date   Allergic rhinitis    Anemia    Anxiety    Arthritis    Bipolar disorder (HCC)    Depression    GERD (gastroesophageal reflux disease)    Hyperlipemia    Hypertension    pt states that she has no hx of elevated BP, on med for hot flashes   Obesity     Past Surgical History:  Procedure Laterality Date   ABDOMINAL HYSTERECTOMY     CESAREAN SECTION     WRIST SURGERY     cyst   Z-PLASTY REPAIR      Family History:  Family History  Problem Relation Age of Onset   Diabetes Paternal Grandmother    Hypertension Paternal Grandmother    Diabetes Maternal Grandmother    Hypertension Maternal Grandmother    Diabetes Mother    Hypertension Mother    Diabetes Brother    Diabetes Sister    Heart disease Sister    Hypertension Sister     Social History   Socioeconomic History   Marital status: Widowed    Spouse name: Not on file   Number of children: Not on file   Years of education: Not on file   Highest education level: Not on file  Occupational History   Not on file  Tobacco Use   Smoking status: Every Day    Current packs/day: 0.50    Average packs/day: 0.5 packs/day for 34.0 years (17.0 ttl pk-yrs)    Types: Cigarettes   Smokeless tobacco: Never  Vaping Use   Vaping status: Never Used  Substance and Sexual Activity   Alcohol use: Yes    Alcohol/week: 0.0 standard drinks of alcohol    Comment: occassionally   Drug use: Yes    Types: Marijuana   Sexual activity: Not on file  Other Topics Concern   Not on file  Social History Narrative   Right handed    Lives alone    Social Drivers of Health   Financial Resource Strain: Not on file  Food Insecurity: Not on file  Transportation Needs: Not on file  Physical Activity: Not on file  Stress: Not on file  Social Connections: Not on file   Allergies: No Known Allergies  Metabolic Disorder Labs: Lab Results  Component Value Date   HGBA1C 6.9 06/24/2023   MPG 126 (H) 06/01/2013    No results found for: PROLACTIN Lab Results  Component Value Date   CHOL 168 06/24/2023   TRIG 93 06/24/2023   HDL 64 06/24/2023   CHOLHDL 2.6 06/24/2023   VLDL 27 06/01/2013   LDLCALC 87 06/24/2023   LDLCALC 68 08/01/2022   Lab Results  Component Value Date   TSH 0.86 10/23/2020   TSH 0.694 02/19/2010    Therapeutic Level Labs: No results found for: LITHIUM No results found for: VALPROATE No results found for: CBMZ  Current Medications: Current Outpatient Medications  Medication Sig Dispense Refill   amLODipine  (NORVASC ) 10 MG tablet Take 1 tablet (10 mg total) by mouth daily. 90 tablet 1   BD [employer] MISC Place used needles and syringes in sharps container (Patient not taking: Reported on 04/16/2024) 1 each 0   Cyanocobalamin  (VITAMIN DEFICIENCY SYSTEM-B12) 1000 MCG/ML KIT 1000 mcg daily for 1 week, then weekly for 1 month, then monthly for a year (Patient not taking: Reported on 04/16/2024) 1 kit 0   DULoxetine  (CYMBALTA ) 30 MG capsule Take 3 capsules (90 mg total) by mouth daily. 90 capsule 1   ergocalciferol  (VITAMIN D2) 1.25 MG (50000 UT) capsule Take 1 capsule (50,000 Units total) by mouth once a week. (Patient not taking: Reported on 04/16/2024) 8 capsule 0   estrogens , conjugated, (PREMARIN ) 0.3 MG tablet Take 1 tablet (0.3 mg total) by mouth daily. Take daily for 21 days then do not take for 7 days. 21 tablet 0   gabapentin  (NEURONTIN ) 300 MG capsule Take 1 capsule (300 mg total) by mouth 3 (three) times daily. 90 capsule 1   gabapentin  (NEURONTIN ) 300 MG capsule Take 1 capsule (300 mg total) by mouth at bedtime. 30 capsule 1   hydrOXYzine  (VISTARIL ) 50 MG capsule Take 1 capsule (50 mg total) by mouth 3 (three) times daily as needed. 180 capsule 0   metFORMIN  (GLUCOPHAGE -XR) 500 MG 24 hr tablet Take 1 tablet (500 mg total) by mouth daily with breakfast. 90 tablet 1   methocarbamol  (ROBAXIN ) 500 MG tablet Take 1 tablet (500 mg total) by mouth every 8  (eight) hours as needed for muscle spasms. (Patient not taking: Reported on 04/16/2024) 60 tablet 0   naloxone  (NARCAN ) nasal spray 4 mg/0.1 mL Use as directed for opiate overdose (Patient not taking: Reported on 04/16/2024) 2 each 1   propranolol  (INDERAL ) 20 MG tablet Take 1 tablet (20 mg total) by mouth 2 (two) times daily as needed for panic,agitation,sleep, and anxiety. 60 tablet 3   rosuvastatin  (CRESTOR ) 20 MG tablet Take 1 tablet (20 mg total) by mouth daily. 90 tablet 3   SYRINGE-NEEDLE, DISP, 3 ML 22G X 1-1/2 3 ML MISC Use a new needle  and syringe for each injection. (Patient not taking: Reported on 04/16/2024) 50 each 0   No current facility-administered medications for this visit.   Objective  Psychiatric Specialty Exam: General Appearance: appears at stated age, casually dressed and groomed ***  Behavior: pleasant and cooperative ***  Psychomotor Activity: no psychomotor agitation or retardation noted ***  Eye Contact: fair *** Speech: normal amount, volume and fluency ***   Mood: euthymic *** Affect: congruent, pleasant and interactive ***  Thought Process: linear, goal directed, no circumstantial or tangential thought process noted, no racing thoughts or flight of ideas *** Descriptions of Associations: intact ***  Thought Content Hallucinations: denies AH, VH , does not appear responding to stimuli *** Delusions: no paranoia, delusions of control, grandeur, ideas of reference, thought broadcasting, and magical thinking *** Suicidal Thoughts: denies SI, intention, plan *** Homicidal Thoughts: denies HI, intention, plan ***  Alertness/Orientation: alert and fully oriented ***  Insight: fair*** Judgment: fair***  Memory: intact ***  Executive Functions  Concentration: intact *** Attention Span: fair *** Recall: intact *** Fund of Knowledge: fair ***  Physical Exam *** General: Pleasant, well-appearing ***. No acute distress. Pulmonary: Normal effort. No  wheezing or rales. Skin: No obvious rash or lesions. Neuro: A&Ox3.No focal deficit.  Review of Systems *** No reported symptoms   Screenings: CAGE-AID    Flowsheet Row Clinical Support from 08/08/2022 in [facility]  CAGE-AID Score 3   GAD-7    Flowsheet Row Office Visit from 06/24/2023 in [facility] Clinical Support from 03/13/2023 in [facility] Office Visit from 08/01/2022 in [facility] Office Visit from 01/10/2022 in [facility] Clinical Support from 07/25/2021 in [facility]  Total GAD-7 Score 11 16 12 15 15    PHQ2-9    Flowsheet Row Office Visit from 04/16/2024 in [facility] Office Visit from 06/24/2023 in [facility] Clinical Support from 03/13/2023 in [facility] Office Visit from 08/01/2022 in [facility] Office Visit from 01/10/2022 in [facility]  PHQ-2 Total Score 3 6 5 4 5   PHQ-9 Total Score 9 13 16 13 15    Flowsheet Row ED from 04/07/2024 in [facility] Department at [facility] ED from 12/05/2023 in [facility] Emergency Department at [facility] Clinical Support from 01/17/2021 in [facility]  C-SSRS RISK CATEGORY No Risk No Risk No Risk    Ismael Franco, MD PGY-3 Psychiatry Resident

## 2024-05-25 ENCOUNTER — Telehealth (INDEPENDENT_AMBULATORY_CARE_PROVIDER_SITE_OTHER): Payer: Self-pay | Admitting: Primary Care

## 2024-05-25 NOTE — Telephone Encounter (Signed)
 Called pt to reschedule appt. Pt did not answer and LVM. Please reschedule appt if pt call back

## 2024-06-01 ENCOUNTER — Encounter (HOSPITAL_COMMUNITY): Payer: MEDICAID | Admitting: Psychiatry

## 2024-06-08 ENCOUNTER — Ambulatory Visit (INDEPENDENT_AMBULATORY_CARE_PROVIDER_SITE_OTHER): Payer: MEDICAID | Admitting: Primary Care

## 2024-06-18 ENCOUNTER — Telehealth (HOSPITAL_COMMUNITY): Payer: Self-pay | Admitting: *Deleted

## 2024-06-18 NOTE — Telephone Encounter (Signed)
 Pt called to request something for sleep as the Gabapentin  not working she says. She would like something additional for sleep.   Last visit: 04/13/24 Next visit: 07/06/24

## 2024-06-21 ENCOUNTER — Telehealth (HOSPITAL_COMMUNITY): Payer: Self-pay

## 2024-06-21 ENCOUNTER — Other Ambulatory Visit: Payer: Self-pay

## 2024-06-21 NOTE — Telephone Encounter (Signed)
 Patient called in stating the Gabapentin  300 mg at bedtime is not helping her to sleep. Pt is requesting a different medication be called in for. Patient was informed provider may want to speak  her prior to sending in any medications. Patient expressed understanding.

## 2024-06-22 ENCOUNTER — Other Ambulatory Visit: Payer: Self-pay

## 2024-06-25 ENCOUNTER — Encounter (HOSPITAL_COMMUNITY): Payer: MEDICAID | Admitting: Psychiatry

## 2024-06-28 NOTE — Progress Notes (Deleted)
 BH MD/PA/NP OP Progress Note  Angel Hughes  MRN:  [id number]  Assessment and Plan:   In the previous visit, we discussed that much of her presentation is due to her ongoing marijuana use and occasional alcohol use. Gabapentin  was increased and topamax  and trazodone  were discontinued as well. Patient was overusing her trazodone  and hydroxyzine . ***  Bipolar disorder versus substance-induced mood disorder EtOH use disorder Grief GAD Cannabis use d/o, moderate Prior medications: trazodone  (was overusing, was taking 4 tablets 100 mg nightly), topamax  (was meant to augment with Seroquel ) - Continue Cymbalta  90 mg daily - Increase gabapentin  to 300 mg qam, 300 mg qpm, and 600 qhs - Continue Hydroxyzine  50mg  TID PRN - Discuss obtained EKG with PCP  Chief Complaint:  No chief complaint on file.  HPI:  Patient seen ***.  Patient reports feeling *** today. Since the previous visit, ***. Regarding medications, patient notes ***. Patient reports the following adverse effects: ***.   Patient reports *** sleep, ***. Patient reports *** appetite, ***.   Patient denies current SI, HI, and AVH. ***  Stressors include ***.   Substance use: *** Etoh: yes, 1 20 oz of smirnoff cooler yesterday and prior a few months ago THC: 1 blunt/day Cigs: 0.5ppd Caffeine: no   Visit Diagnosis:  No diagnosis found.    Past Psychiatric History: Dx:Bipolar dx, SIMD, Etoh use d/o, GAD INPT: Negative OPT: Dr. Harl and Merrily Previous meds: Prozac, Paxil, Geodon, Topamax , Seroquel  NO SA   Trauma: Patient found her son deceased in his bed in 02-26-2023.   Family Psychiatric History: Mom: Some psych hx but unknown specifics Dad and brother: Etoh use disorder   Denies hx of SA Found son dead in bed 2022/12/27  Social History:  Living:  Currently living with her neice Occupation: unemployed Relationship: widowed Children: had one child Support: friends  Past Medical History:  Past Medical  History:  Diagnosis Date   Allergic rhinitis    Anemia    Anxiety    Arthritis    Bipolar disorder (HCC)    Depression    GERD (gastroesophageal reflux disease)    Hyperlipemia    Hypertension    pt states that she has no hx of elevated BP, on med for hot flashes   Obesity     Past Surgical History:  Procedure Laterality Date   ABDOMINAL HYSTERECTOMY     CESAREAN SECTION     WRIST SURGERY     cyst   Z-PLASTY REPAIR      Family History:  Family History  Problem Relation Age of Onset   Diabetes Paternal Grandmother    Hypertension Paternal Grandmother    Diabetes Maternal Grandmother    Hypertension Maternal Grandmother    Diabetes Mother    Hypertension Mother    Diabetes Brother    Diabetes Sister    Heart disease Sister    Hypertension Sister     Social History   Socioeconomic History   Marital status: Widowed    Spouse name: Not on file   Number of children: Not on file   Years of education: Not on file   Highest education level: Not on file  Occupational History   Not on file  Tobacco Use   Smoking status: Every Day    Current packs/day: 0.50    Average packs/day: 0.5 packs/day for 34.0 years (17.0 ttl pk-yrs)    Types: Cigarettes   Smokeless tobacco: Never  Vaping Use   Vaping status: Never Used  Substance and Sexual Activity   Alcohol use: Yes    Alcohol/week: 0.0 standard drinks of alcohol    Comment: occassionally   Drug use: Yes    Types: Marijuana   Sexual activity: Not on file  Other Topics Concern   Not on file  Social History Narrative   Right handed    Lives alone    Social Drivers of Health   Financial Resource Strain: Not on file  Food Insecurity: Not on file  Transportation Needs: Not on file  Physical Activity: Not on file  Stress: Not on file  Social Connections: Not on file   Allergies: No Known Allergies  Metabolic Disorder Labs: Lab Results  Component Value Date   HGBA1C 6.9 06/24/2023   MPG 126 (H) 06/01/2013    No results found for: PROLACTIN Lab Results  Component Value Date   CHOL 168 06/24/2023   TRIG 93 06/24/2023   HDL 64 06/24/2023   CHOLHDL 2.6 06/24/2023   VLDL 27 06/01/2013   LDLCALC 87 06/24/2023   LDLCALC 68 08/01/2022   Lab Results  Component Value Date   TSH 0.86 10/23/2020   TSH 0.694 02/19/2010    Therapeutic Level Labs: No results found for: LITHIUM No results found for: VALPROATE No results found for: CBMZ  Current Medications: Current Outpatient Medications  Medication Sig Dispense Refill   amLODipine  (NORVASC ) 10 MG tablet Take 1 tablet (10 mg total) by mouth daily. 90 tablet 1   BD [employer] MISC Place used needles and syringes in sharps container (Patient not taking: Reported on 04/16/2024) 1 each 0   Cyanocobalamin  (VITAMIN DEFICIENCY SYSTEM-B12) 1000 MCG/ML KIT 1000 mcg daily for 1 week, then weekly for 1 month, then monthly for a year (Patient not taking: Reported on 04/16/2024) 1 kit 0   DULoxetine  (CYMBALTA ) 30 MG capsule Take 3 capsules (90 mg total) by mouth daily. 90 capsule 1   ergocalciferol  (VITAMIN D2) 1.25 MG (50000 UT) capsule Take 1 capsule (50,000 Units total) by mouth once a week. (Patient not taking: Reported on 04/16/2024) 8 capsule 0   estrogens , conjugated, (PREMARIN ) 0.3 MG tablet Take 1 tablet (0.3 mg total) by mouth daily. Take daily for 21 days then do not take for 7 days. 21 tablet 0   gabapentin  (NEURONTIN ) 300 MG capsule Take 1 capsule (300 mg total) by mouth 3 (three) times daily. 90 capsule 1   gabapentin  (NEURONTIN ) 300 MG capsule Take 1 capsule (300 mg total) by mouth at bedtime. 30 capsule 1   hydrOXYzine  (VISTARIL ) 50 MG capsule Take 1 capsule (50 mg total) by mouth 3 (three) times daily as needed. 180 capsule 0   metFORMIN  (GLUCOPHAGE -XR) 500 MG 24 hr tablet Take 1 tablet (500 mg total) by mouth daily with breakfast. 90 tablet 1   methocarbamol  (ROBAXIN ) 500 MG tablet Take 1 tablet (500 mg total) by mouth every 8  (eight) hours as needed for muscle spasms. (Patient not taking: Reported on 04/16/2024) 60 tablet 0   naloxone  (NARCAN ) nasal spray 4 mg/0.1 mL Use as directed for opiate overdose (Patient not taking: Reported on 04/16/2024) 2 each 1   propranolol  (INDERAL ) 20 MG tablet Take 1 tablet (20 mg total) by mouth 2 (two) times daily as needed for panic,agitation,sleep, and anxiety. 60 tablet 3   rosuvastatin  (CRESTOR ) 20 MG tablet Take 1 tablet (20 mg total) by mouth daily. 90 tablet 3   SYRINGE-NEEDLE, DISP, 3 ML 22G X 1-1/2 3 ML MISC Use a new needle  and syringe for each injection. (Patient not taking: Reported on 04/16/2024) 50 each 0   No current facility-administered medications for this visit.   Objective  Psychiatric Specialty Exam: General Appearance: appears at stated age, casually dressed and groomed ***  Behavior: pleasant and cooperative ***  Psychomotor Activity: no psychomotor agitation or retardation noted ***  Eye Contact: fair *** Speech: normal amount, volume and fluency ***   Mood: euthymic *** Affect: congruent, pleasant and interactive ***  Thought Process: linear, goal directed, no circumstantial or tangential thought process noted, no racing thoughts or flight of ideas *** Descriptions of Associations: intact ***  Thought Content Hallucinations: denies AH, VH , does not appear responding to stimuli *** Delusions: no paranoia, delusions of control, grandeur, ideas of reference, thought broadcasting, and magical thinking *** Suicidal Thoughts: denies SI, intention, plan *** Homicidal Thoughts: denies HI, intention, plan ***  Alertness/Orientation: alert and fully oriented ***  Insight: fair*** Judgment: fair***  Memory: intact ***  Executive Functions  Concentration: intact *** Attention Span: fair *** Recall: intact *** Fund of Knowledge: fair ***  Physical Exam *** General: Pleasant, well-appearing ***. No acute distress. Pulmonary: Normal effort. No  wheezing or rales. Skin: No obvious rash or lesions. Neuro: A&Ox3.No focal deficit.  Review of Systems *** No reported symptoms   Screenings: CAGE-AID    Flowsheet Row Clinical Support from 08/08/2022 in [facility]  CAGE-AID Score 3   GAD-7    Flowsheet Row Office Visit from 06/24/2023 in [facility] Clinical Support from 03/13/2023 in [facility] Office Visit from 08/01/2022 in [facility] Office Visit from 01/10/2022 in [facility] Clinical Support from 07/25/2021 in [facility]  Total GAD-7 Score 11 16 12 15 15    PHQ2-9    Flowsheet Row Office Visit from 04/16/2024 in [facility] Office Visit from 06/24/2023 in [facility] Clinical Support from 03/13/2023 in [facility] Office Visit from 08/01/2022 in [facility] Office Visit from 01/10/2022 in [facility]  PHQ-2 Total Score 3 6 5 4 5   PHQ-9 Total Score 9 13 16 13 15    Flowsheet Row ED from 04/07/2024 in [facility] Department at [facility] ED from 12/05/2023 in [facility] Emergency Department at [facility] Clinical Support from 01/17/2021 in [facility]  C-SSRS RISK CATEGORY No Risk No Risk No Risk    Ismael Franco, MD PGY-3 Psychiatry Resident

## 2024-07-01 ENCOUNTER — Telehealth (HOSPITAL_COMMUNITY): Payer: Self-pay

## 2024-07-01 ENCOUNTER — Other Ambulatory Visit (HOSPITAL_COMMUNITY): Payer: Self-pay | Admitting: Psychiatry

## 2024-07-01 DIAGNOSIS — Z5181 Encounter for therapeutic drug level monitoring: Secondary | ICD-10-CM

## 2024-07-01 NOTE — Telephone Encounter (Signed)
 Per patient her PCP doesn't have EKG machine at there an office. Patient is wondering if Dr. Izella can order an EKG for her to be done at this facility

## 2024-07-05 ENCOUNTER — Other Ambulatory Visit: Payer: Self-pay

## 2024-07-06 ENCOUNTER — Encounter (HOSPITAL_COMMUNITY): Payer: MEDICAID | Admitting: Psychiatry

## 2024-07-12 NOTE — Progress Notes (Addendum)
 BH MD/PA/NP OP Progress Note  Angel Hughes  MRN:  [id number]  Assessment and Plan:   In the previous visit, we discussed that much of her presentation is likely influenced by her ongoing marijuana use and occasional alcohol use. Gabapentin  was increased and topamax  and trazodone  were discontinued as well due to prolonged Qtc in most recent EKG. Of note, patient was previously overusing her trazodone  and hydroxyzine .   Today, patient reports stability regarding her mood on her current medication regimen. She is requesting to be placed back on Seroquel  for sleep as she was previously on 400 mg but then tapered off in the past due to prolonged Qtc. Her recent Qtc this month was not prolonged and extensive counseling was done to discuss the importance to not take more medications than instructed. With her risk of overmedicating herself on her medication, will start Seroquel  but limit the amount of pills that patient can pick up at a time. Regarding diagnostic formulation, she does not recall a hypomanic/manic episode during her period off substances in the past however keeping in mind this was 20 years ago.   At this time, I will restart Seroquel  to aid with insomnia and mood stability. Patient also has low quality sleep even with seemingly sufficient sleep duration and she also notes snoring in which I am suspicious of sleep apnea so I ordered a sleep study and discussed with her that if CPAP becomes a recommendation then we will likely start de-escalating on her sedation medications and she understands. Behavioral activation was also discussed as patient spends much of her time smoking marijuana in which she is in the pre contemplative stage. This is also likely influencing her sleep architecture. Will f/u in one month to reassess.   #Bipolar disorder versus substance-induced mood disorder #EtOH use disorder #GAD #Cannabis use d/o Prior medications: trazodone  (was overusing, was taking 4 tablets 100  mg nightly), topamax  (was meant to augment with Seroquel ) - Continue Cymbalta  90 mg daily - Continue Gabapentin  300 mg qam, 300 mg qpm, and 600 qhs - Continue Hydroxyzine  50mg  TID PRN - Start Seroquel  50 mg for one week then increase to 100 mg   - CBC, CMP, A1c in 10/25; lipid WNL in 06/2023; needs updated TSH and lipid panel  - 10/15 EKG Qtc 405  # Insomnia, r/o sleep apnea - Sleep study referral placed - Seroquel  as above   HPI:  Patient seen alone.  Patient reports feeling okay today. Since the previous visit, she reports recently helping a friend cleaning up at a group home. She enjoys watching western movies during her free time. She reports talking and spending time with her friends. She notes taking the hydroxyzine  three times daily. She states not doing nothing worries her. Discussed behavioral activation like walking outside at least three times a week. Regarding medications, patient notes benefit specifically keeping me calm. She does note some uncontrolled worry, rating the severity a 7/10, stating this is about the same as last. She also states that her anxiety is also regarding her son who passed away a year ago. She is declining therapy at this time and did not state why.   Patient reports poor sleep, reporting sleeping from 2 AM- 9:30 AM but states she is not well-rested. She states that Seroquel  was the only helpful medication for sleep. She states that at night, her son and boyfriend have said she snores. She states that her sleeping environment is dark, cold with a TV in the background. Patient reports  good appetite.   Patient denies current SI, HI, and AVH.   Substance use:  Etoh: 2 beers weekly THC: 1 blunt/day Cigs: 0.5 ppd Caffeine: no  Past Psychiatric History: Dx: Bipolar dx, SIMD, Etoh use d/o, GAD INPT: Negative OPT: Dr. Harl and Merrily Previous meds: Prozac, Paxil, Geodon, Topamax , Seroquel  NO SA  Denies periods of mania during period of sobriety  of 6 months.   Trauma: Patient found her son deceased in his bed in March 18, 2023.   Family Psychiatric History: Mom: Some psych hx but unknown specifics Dad and brother: Etoh use disorder   Denies hx of SA Found son dead in bed 16-Jan-2023  Social History:  Living:  Currently living with her neice Occupation: unemployed Relationship: widowed Children: had one child Support: friends  Past Medical History:  Past Medical History:  Diagnosis Date   Allergic rhinitis    Anemia    Anxiety    Arthritis    Bipolar disorder (HCC)    Depression    GERD (gastroesophageal reflux disease)    Hyperlipemia    Hypertension    pt states that she has no hx of elevated BP, on med for hot flashes   Obesity     Past Surgical History:  Procedure Laterality Date   ABDOMINAL HYSTERECTOMY     CESAREAN SECTION     WRIST SURGERY     cyst   Z-PLASTY REPAIR      Family History:  Family History  Problem Relation Age of Onset   Diabetes Paternal Grandmother    Hypertension Paternal Grandmother    Diabetes Maternal Grandmother    Hypertension Maternal Grandmother    Diabetes Mother    Hypertension Mother    Diabetes Brother    Diabetes Sister    Heart disease Sister    Hypertension Sister     Social History   Socioeconomic History   Marital status: Widowed    Spouse name: Not on file   Number of children: Not on file   Years of education: Not on file   Highest education level: Not on file  Occupational History   Not on file  Tobacco Use   Smoking status: Every Day    Current packs/day: 0.50    Average packs/day: 0.5 packs/day for 34.0 years (17.0 ttl pk-yrs)    Types: Cigarettes   Smokeless tobacco: Never  Vaping Use   Vaping status: Never Used  Substance and Sexual Activity   Alcohol use: Yes    Alcohol/week: 0.0 standard drinks of alcohol    Comment: occassionally   Drug use: Yes    Types: Marijuana   Sexual activity: Not on file  Other Topics Concern   Not on file  Social  History Narrative   Right handed    Lives alone    Social Drivers of Health   Financial Resource Strain: Not on file  Food Insecurity: Not on file  Transportation Needs: Not on file  Physical Activity: Not on file  Stress: Not on file  Social Connections: Not on file   Allergies: No Known Allergies  Metabolic Disorder Labs: Lab Results  Component Value Date   HGBA1C 6.9 06/24/2023   MPG 126 (H) 06/01/2013   No results found for: PROLACTIN Lab Results  Component Value Date   CHOL 168 06/24/2023   TRIG 93 06/24/2023   HDL 64 06/24/2023   CHOLHDL 2.6 06/24/2023   VLDL 27 06/01/2013   LDLCALC 87 06/24/2023   LDLCALC 68 08/01/2022   Lab  Results  Component Value Date   TSH 0.86 10/23/2020   TSH 0.694 02/19/2010    Therapeutic Level Labs: No results found for: LITHIUM No results found for: VALPROATE No results found for: CBMZ  Current Medications: Current Outpatient Medications  Medication Sig Dispense Refill   amLODipine  (NORVASC ) 10 MG tablet Take 1 tablet (10 mg total) by mouth daily. 90 tablet 1   BD [employer] MISC Place used needles and syringes in sharps container (Patient not taking: Reported on 04/16/2024) 1 each 0   Cyanocobalamin  (VITAMIN DEFICIENCY SYSTEM-B12) 1000 MCG/ML KIT 1000 mcg daily for 1 week, then weekly for 1 month, then monthly for a year (Patient not taking: Reported on 04/16/2024) 1 kit 0   DULoxetine  (CYMBALTA ) 30 MG capsule Take 3 capsules (90 mg total) by mouth daily. 90 capsule 1   ergocalciferol  (VITAMIN D2) 1.25 MG (50000 UT) capsule Take 1 capsule (50,000 Units total) by mouth once a week. (Patient not taking: Reported on 04/16/2024) 8 capsule 0   estrogens , conjugated, (PREMARIN ) 0.3 MG tablet Take 1 tablet (0.3 mg total) by mouth daily. Take daily for 21 days then do not take for 7 days. 21 tablet 0   gabapentin  (NEURONTIN ) 300 MG capsule Take 1 capsule (300 mg total) by mouth 3 (three) times daily. 90 capsule 1    gabapentin  (NEURONTIN ) 300 MG capsule Take 1 capsule (300 mg total) by mouth at bedtime. 30 capsule 1   hydrOXYzine  (VISTARIL ) 50 MG capsule Take 1 capsule (50 mg total) by mouth 3 (three) times daily as needed. 180 capsule 0   metFORMIN  (GLUCOPHAGE -XR) 500 MG 24 hr tablet Take 1 tablet (500 mg total) by mouth daily with breakfast. 90 tablet 1   methocarbamol  (ROBAXIN ) 500 MG tablet Take 1 tablet (500 mg total) by mouth every 8 (eight) hours as needed for muscle spasms. (Patient not taking: Reported on 04/16/2024) 60 tablet 0   naloxone  (NARCAN ) nasal spray 4 mg/0.1 mL Use as directed for opiate overdose (Patient not taking: Reported on 04/16/2024) 2 each 1   propranolol  (INDERAL ) 20 MG tablet Take 1 tablet (20 mg total) by mouth 2 (two) times daily as needed for panic,agitation,sleep, and anxiety. 60 tablet 3   rosuvastatin  (CRESTOR ) 20 MG tablet Take 1 tablet (20 mg total) by mouth daily. 90 tablet 3   SYRINGE-NEEDLE, DISP, 3 ML 22G X 1-1/2 3 ML MISC Use a new needle and syringe for each injection. (Patient not taking: Reported on 04/16/2024) 50 each 0   No current facility-administered medications for this visit.   Objective  Psychiatric Specialty Exam: General Appearance: appears at stated age, casually dressed and groomed   Behavior: pleasant and cooperative   Psychomotor Activity: no psychomotor agitation or retardation noted   Eye Contact: fair  Speech: normal amount, volume and fluency    Mood: euthymic  Affect: congruent, pleasant and interactive   Thought Process: linear, goal directed, no circumstantial or tangential thought process noted, no racing thoughts or flight of ideas  Descriptions of Associations: intact   Thought Content Hallucinations: denies AH, VH , does not appear responding to stimuli  Delusions: no paranoia, delusions of control, grandeur, ideas of reference, thought broadcasting, and magical thinking  Suicidal Thoughts: denies SI, intention, plan   Homicidal Thoughts: denies HI, intention, plan   Alertness/Orientation: alert and fully oriented   Insight: fair Judgment: fair  Memory: intact   Executive Functions  Concentration: intact  Attention Span: fair  Recall: intact  Fund of Knowledge:  fair   Physical Exam  General: Pleasant, well-appearing . No acute distress. Pulmonary: Normal effort. No wheezing or rales. Skin: No obvious rash or lesions. Neuro: A&Ox3.No focal deficit.  Review of Systems  No reported symptoms   Screenings: CAGE-AID    Flowsheet Row Clinical Support from 08/08/2022 in [facility]  CAGE-AID Score 3   GAD-7    Flowsheet Row Office Visit from 06/24/2023 in [facility] Clinical Support from 03/13/2023 in [facility] Office Visit from 08/01/2022 in [facility] Office Visit from 01/10/2022 in [facility] Clinical Support from 07/25/2021 in [facility]  Total GAD-7 Score 11 16 12 15 15    PHQ2-9    Flowsheet Row Office Visit from 04/16/2024 in [facility] Office Visit from 06/24/2023 in [facility] Clinical Support from 03/13/2023 in [facility] Office Visit from 08/01/2022 in [facility] Office Visit from 01/10/2022 in [facility]  PHQ-2 Total Score 3 6 5 4 5   PHQ-9 Total Score 9 13 16 13 15    Flowsheet Row ED from 04/07/2024 in [facility] Department at [facility] ED from 12/05/2023 in [facility] Emergency Department at [facility] Clinical Support from 01/17/2021 in [facility]  C-SSRS RISK CATEGORY No Risk No Risk No Risk    Ismael Franco, MD PGY-3 Psychiatry Resident

## 2024-07-13 ENCOUNTER — Encounter: Payer: Self-pay | Admitting: Nurse Practitioner

## 2024-07-13 ENCOUNTER — Other Ambulatory Visit: Payer: Self-pay

## 2024-07-13 ENCOUNTER — Ambulatory Visit: Payer: MEDICAID | Attending: Nurse Practitioner | Admitting: Nurse Practitioner

## 2024-07-13 VITALS — BP 102/68 | HR 76 | Resp 19 | Ht 63.0 in | Wt 161.4 lb

## 2024-07-13 DIAGNOSIS — I10 Essential (primary) hypertension: Secondary | ICD-10-CM | POA: Diagnosis not present

## 2024-07-13 DIAGNOSIS — D649 Anemia, unspecified: Secondary | ICD-10-CM

## 2024-07-13 DIAGNOSIS — E119 Type 2 diabetes mellitus without complications: Secondary | ICD-10-CM

## 2024-07-13 DIAGNOSIS — Z7984 Long term (current) use of oral hypoglycemic drugs: Secondary | ICD-10-CM

## 2024-07-13 DIAGNOSIS — Z23 Encounter for immunization: Secondary | ICD-10-CM

## 2024-07-13 DIAGNOSIS — N951 Menopausal and female climacteric states: Secondary | ICD-10-CM

## 2024-07-13 DIAGNOSIS — M545 Low back pain, unspecified: Secondary | ICD-10-CM

## 2024-07-13 MED ORDER — AMLODIPINE BESYLATE 10 MG PO TABS
10.0000 mg | ORAL_TABLET | Freq: Every day | ORAL | 1 refills | Status: AC
  Filled 2024-07-13 – 2024-09-14 (×4): qty 90, 90d supply, fill #0

## 2024-07-13 MED ORDER — CYCLOBENZAPRINE HCL 5 MG PO TABS
5.0000 mg | ORAL_TABLET | Freq: Three times a day (TID) | ORAL | 1 refills | Status: AC | PRN
  Filled 2024-07-13: qty 30, 10d supply, fill #0
  Filled 2024-09-14: qty 30, 10d supply, fill #1

## 2024-07-13 MED ORDER — METFORMIN HCL ER 500 MG PO TB24
500.0000 mg | ORAL_TABLET | Freq: Every day | ORAL | 1 refills | Status: AC
  Filled 2024-07-13 – 2024-09-14 (×2): qty 90, 90d supply, fill #0

## 2024-07-13 MED ORDER — ESTROGENS CONJUGATED 0.3 MG PO TABS
0.3000 mg | ORAL_TABLET | Freq: Every day | ORAL | 2 refills | Status: DC
  Filled 2024-07-13: qty 21, 21d supply, fill #0
  Filled 2024-08-09 – 2024-09-14 (×2): qty 21, 21d supply, fill #1

## 2024-07-13 NOTE — Progress Notes (Addendum)
 Assessment & Plan:  Angel Hughes was seen today for hypertension.  Diagnoses and all orders for this visit:  Essential hypertension -     amLODipine  (NORVASC ) 10 MG tablet; Take 1 tablet (10 mg total) by mouth daily. Continue all antihypertensives as prescribed.  Reminded to bring in blood pressure log for follow  up appointment.  RECOMMENDATIONS: DASH/Mediterranean Diets are healthier choices for HTN.    Type 2 diabetes mellitus without complication, without long-term current use of insulin (HCC) -     metFORMIN  (GLUCOPHAGE -XR) 500 MG 24 hr tablet; Take 1 tablet (500 mg total) by mouth daily with breakfast. -     CMP14+EGFR -     Hemoglobin A1c  Hot flashes due to menopause -     estrogens , conjugated, (PREMARIN ) 0.3 MG tablet; Take 1 tablet (0.3 mg total) by mouth daily. Take daily for 21 days then do not take for 7 days.  Anemia, unspecified type -     CBC with Differential/Platelet Anemia identified, etiology unspecified. Post-menopausal status makes menstrual blood loss unlikely. Differential includes gastrointestinal blood loss, possibly from polyps or other lesions. - Order CBC to assess current hemoglobin levels. - Recommend colonoscopy to investigate potential gastrointestinal causes. - Consider prescribing iron supplementation if CBC confirms anemia.   Acute midline low back pain without sciatica -     cyclobenzaprine (FLEXERIL) 5 MG tablet; Take 1 tablet (5 mg total) by mouth 3 (three) times daily as needed for muscle spasms.  Need for influenza vaccination -     Flu vaccine trivalent PF, 6mos and older(Flulaval,Afluria,Fluarix,Fluzone)    Patient has been counseled on age-appropriate routine health concerns for screening and prevention. These are reviewed and up-to-date. Referrals have been placed accordingly. Immunizations are up-to-date or declined.    Subjective:   Chief Complaint  Patient presents with   Hypertension    Angel Hughes 63 y.o. female presents to  office today for medication refills and follow-up. She is a patient of Rosaline Bohr ANP-C   She has hypertension and is currently taking amlodipine  for blood pressure management, requesting a refill.  BP Readings from Last 3 Encounters:  07/13/24 102/68  04/16/24 123/77  04/13/24 114/80    She also takes metformin  for diabetes management and needs refills.  Lab Results  Component Value Date   HGBA1C 6.9 06/24/2023      She uses Premarin  for hormone replacement therapy, taking it for 21 days followed by a 7-day break. She is on the second day of her 7-day break and requests a refill.   For her mood disorder, she is followed by [facility].   She has not had a colonoscopy. She does not currently take iron supplement and CBC shows anemia.  She has a history of low back pain without sciatica. Requesting muscle relaxant today and states methocarbamol  has been ineffective in the past.      Review of Systems  Constitutional:  Negative for fever, malaise/fatigue and weight loss.  HENT: Negative.  Negative for nosebleeds.   Eyes: Negative.  Negative for blurred vision, double vision and photophobia.  Respiratory: Negative.  Negative for cough and shortness of breath.   Cardiovascular: Negative.  Negative for chest pain, palpitations and leg swelling.  Gastrointestinal: Negative.  Negative for heartburn, nausea and vomiting.  Musculoskeletal:  Positive for back pain. Negative for myalgias.  Neurological: Negative.  Negative for dizziness, focal weakness, seizures and headaches.  Psychiatric/Behavioral:  Negative for suicidal ideas.     Past Medical History:  Diagnosis Date   Allergic rhinitis    Anemia    Anxiety    Arthritis    Bipolar disorder (HCC)    Depression    GERD (gastroesophageal reflux disease)    Hyperlipemia    Hypertension    pt states that she has no hx of elevated BP, on med for hot flashes   Obesity     Past Surgical History:  Procedure  Laterality Date   ABDOMINAL HYSTERECTOMY     CESAREAN SECTION     WRIST SURGERY     cyst   Z-PLASTY REPAIR      Family History  Problem Relation Age of Onset   Diabetes Paternal Grandmother    Hypertension Paternal Grandmother    Diabetes Maternal Grandmother    Hypertension Maternal Grandmother    Diabetes Mother    Hypertension Mother    Diabetes Brother    Diabetes Sister    Heart disease Sister    Hypertension Sister     Social History Reviewed with no changes to be made today.   Outpatient Medications Prior to Visit  Medication Sig Dispense Refill   DULoxetine  (CYMBALTA ) 30 MG capsule Take 3 capsules (90 mg total) by mouth daily. 90 capsule 1   gabapentin  (NEURONTIN ) 300 MG capsule Take 1 capsule (300 mg total) by mouth 3 (three) times daily. 90 capsule 1   gabapentin  (NEURONTIN ) 300 MG capsule Take 1 capsule (300 mg total) by mouth at bedtime. 30 capsule 1   hydrOXYzine  (VISTARIL ) 50 MG capsule Take 1 capsule (50 mg total) by mouth 3 (three) times daily as needed. 180 capsule 0   propranolol  (INDERAL ) 20 MG tablet Take 1 tablet (20 mg total) by mouth 2 (two) times daily as needed for panic,agitation,sleep, and anxiety. 60 tablet 3   rosuvastatin  (CRESTOR ) 20 MG tablet Take 1 tablet (20 mg total) by mouth daily. 90 tablet 3   amLODipine  (NORVASC ) 10 MG tablet Take 1 tablet (10 mg total) by mouth daily. 90 tablet 1   estrogens , conjugated, (PREMARIN ) 0.3 MG tablet Take 1 tablet (0.3 mg total) by mouth daily. Take daily for 21 days then do not take for 7 days. 21 tablet 0   metFORMIN  (GLUCOPHAGE -XR) 500 MG 24 hr tablet Take 1 tablet (500 mg total) by mouth daily with breakfast. 90 tablet 1   naloxone  (NARCAN ) nasal spray 4 mg/0.1 mL Use as directed for opiate overdose (Patient not taking: Reported on 07/13/2024) 2 each 1   BD [employer] MISC Place used needles and syringes in sharps container (Patient not taking: Reported on 07/13/2024) 1 each 0   Cyanocobalamin   (VITAMIN DEFICIENCY SYSTEM-B12) 1000 MCG/ML KIT 1000 mcg daily for 1 week, then weekly for 1 month, then monthly for a year (Patient not taking: Reported on 07/13/2024) 1 kit 0   ergocalciferol  (VITAMIN D2) 1.25 MG (50000 UT) capsule Take 1 capsule (50,000 Units total) by mouth once a week. (Patient not taking: Reported on 07/13/2024) 8 capsule 0   methocarbamol  (ROBAXIN ) 500 MG tablet Take 1 tablet (500 mg total) by mouth every 8 (eight) hours as needed for muscle spasms. (Patient not taking: Reported on 07/13/2024) 60 tablet 0   SYRINGE-NEEDLE, DISP, 3 ML 22G X 1-1/2 3 ML MISC Use a new needle and syringe for each injection. (Patient not taking: Reported on 07/13/2024) 50 each 0   No facility-administered medications prior to visit.    No Known Allergies     Objective:    BP 102/68 (  BP Location: Left Arm, Patient Position: Sitting, Cuff Size: Large)   Pulse 76   Resp 19   Ht 5' 3 (1.6 m)   Wt 161 lb 6.4 oz (73.2 kg)   SpO2 98%   BMI 28.59 kg/m  Wt Readings from Last 3 Encounters:  07/13/24 161 lb 6.4 oz (73.2 kg)  04/16/24 168 lb 6.4 oz (76.4 kg)  04/13/24 156 lb (70.8 kg)    Physical Exam Vitals and nursing note reviewed.  Constitutional:      Appearance: She is well-developed.  HENT:     Head: Normocephalic and atraumatic.  Cardiovascular:     Rate and Rhythm: Normal rate and regular rhythm.     Heart sounds: Normal heart sounds. No murmur heard.    No friction rub. No gallop.  Pulmonary:     Effort: Pulmonary effort is normal. No tachypnea or respiratory distress.     Breath sounds: Normal breath sounds. No decreased breath sounds, wheezing, rhonchi or rales.  Chest:     Chest wall: No tenderness.  Abdominal:     General: Bowel sounds are normal.     Palpations: Abdomen is soft.  Musculoskeletal:        General: Normal range of motion.     Cervical back: Normal range of motion.  Skin:    General: Skin is warm and dry.  Neurological:     Mental Status: She is  alert and oriented to person, place, and time.     Coordination: Coordination normal.  Psychiatric:        Behavior: Behavior normal. Behavior is cooperative.        Thought Content: Thought content normal.        Judgment: Judgment normal.          Patient has been counseled extensively about nutrition and exercise as well as the importance of adherence with medications and regular follow-up. The patient was given clear instructions to go to ER or return to medical center if symptoms don't improve, worsen or new problems develop. The patient verbalized understanding.   Follow-up: Return in about 3 months (around 10/13/2024).   Haze LELON Servant, FNP-BC [facility] [place], [place] [phone]   07/13/2024, 10:33 AM

## 2024-07-14 ENCOUNTER — Other Ambulatory Visit: Payer: Self-pay

## 2024-07-14 ENCOUNTER — Other Ambulatory Visit (INDEPENDENT_AMBULATORY_CARE_PROVIDER_SITE_OTHER): Payer: MEDICAID

## 2024-07-14 DIAGNOSIS — F411 Generalized anxiety disorder: Secondary | ICD-10-CM

## 2024-07-14 LAB — CMP14+EGFR
ALT: 14 IU/L (ref 0–32)
AST: 26 IU/L (ref 0–40)
Albumin: 4.6 g/dL (ref 3.9–4.9)
Alkaline Phosphatase: 71 IU/L (ref 49–135)
BUN/Creatinine Ratio: 13 (ref 12–28)
BUN: 12 mg/dL (ref 8–27)
Bilirubin Total: 0.5 mg/dL (ref 0.0–1.2)
CO2: 22 mmol/L (ref 20–29)
Calcium: 9.7 mg/dL (ref 8.7–10.3)
Chloride: 102 mmol/L (ref 96–106)
Creatinine, Ser: 0.94 mg/dL (ref 0.57–1.00)
Globulin, Total: 2.8 g/dL (ref 1.5–4.5)
Glucose: 105 mg/dL — ABNORMAL HIGH (ref 70–99)
Potassium: 3.5 mmol/L (ref 3.5–5.2)
Sodium: 139 mmol/L (ref 134–144)
Total Protein: 7.4 g/dL (ref 6.0–8.5)
eGFR: 68 mL/min/1.73 (ref >59)

## 2024-07-14 LAB — CBC WITH DIFFERENTIAL/PLATELET
Basophils Absolute: 0.1 x10E3/uL (ref 0.0–0.2)
Basos: 1 %
EOS (ABSOLUTE): 0.1 x10E3/uL (ref 0.0–0.4)
Eos: 1 %
Hematocrit: 43.2 % (ref 34.0–46.6)
Hemoglobin: 14 g/dL (ref 11.1–15.9)
Immature Grans (Abs): 0 x10E3/uL (ref 0.0–0.1)
Immature Granulocytes: 0 %
Lymphocytes Absolute: 1.8 x10E3/uL (ref 0.7–3.1)
Lymphs: 24 %
MCH: 27.1 pg (ref 26.6–33.0)
MCHC: 32.4 g/dL (ref 31.5–35.7)
MCV: 84 fL (ref 79–97)
Monocytes Absolute: 0.6 x10E3/uL (ref 0.1–0.9)
Monocytes: 7 %
Neutrophils Absolute: 5.1 x10E3/uL (ref 1.4–7.0)
Neutrophils: 67 %
Platelets: 318 x10E3/uL (ref 150–450)
RBC: 5.16 x10E6/uL (ref 3.77–5.28)
RDW: 15 % (ref 11.7–15.4)
WBC: 7.6 x10E3/uL (ref 3.4–10.8)

## 2024-07-14 LAB — HEMOGLOBIN A1C
Est. average glucose Bld gHb Est-mCnc: 123 mg/dL
Hgb A1c MFr Bld: 5.9 % — ABNORMAL HIGH (ref 4.8–5.6)

## 2024-07-15 ENCOUNTER — Ambulatory Visit: Payer: Self-pay | Admitting: Nurse Practitioner

## 2024-07-16 ENCOUNTER — Other Ambulatory Visit: Payer: Self-pay

## 2024-07-22 ENCOUNTER — Ambulatory Visit (INDEPENDENT_AMBULATORY_CARE_PROVIDER_SITE_OTHER): Payer: MEDICAID | Admitting: Psychiatry

## 2024-07-22 ENCOUNTER — Other Ambulatory Visit: Payer: Self-pay

## 2024-07-22 VITALS — BP 114/78 | Wt 165.0 lb

## 2024-07-22 DIAGNOSIS — F411 Generalized anxiety disorder: Secondary | ICD-10-CM | POA: Diagnosis not present

## 2024-07-22 DIAGNOSIS — F1994 Other psychoactive substance use, unspecified with psychoactive substance-induced mood disorder: Secondary | ICD-10-CM | POA: Diagnosis not present

## 2024-07-22 DIAGNOSIS — G4700 Insomnia, unspecified: Secondary | ICD-10-CM | POA: Insufficient documentation

## 2024-07-22 DIAGNOSIS — F1290 Cannabis use, unspecified, uncomplicated: Secondary | ICD-10-CM | POA: Diagnosis not present

## 2024-07-22 DIAGNOSIS — F1020 Alcohol dependence, uncomplicated: Secondary | ICD-10-CM

## 2024-07-22 DIAGNOSIS — F331 Major depressive disorder, recurrent, moderate: Secondary | ICD-10-CM

## 2024-07-22 DIAGNOSIS — F319 Bipolar disorder, unspecified: Secondary | ICD-10-CM

## 2024-07-22 MED ORDER — GABAPENTIN 300 MG PO CAPS
300.0000 mg | ORAL_CAPSULE | Freq: Three times a day (TID) | ORAL | 1 refills | Status: DC
  Filled 2024-07-22: qty 90, 30d supply, fill #0

## 2024-07-22 MED ORDER — GABAPENTIN 300 MG PO CAPS
300.0000 mg | ORAL_CAPSULE | Freq: Every day | ORAL | 1 refills | Status: DC
  Filled 2024-07-22: qty 30, 30d supply, fill #0

## 2024-07-22 MED ORDER — HYDROXYZINE PAMOATE 50 MG PO CAPS
50.0000 mg | ORAL_CAPSULE | Freq: Three times a day (TID) | ORAL | 0 refills | Status: DC | PRN
Start: 2024-07-22 — End: 2024-08-16
  Filled 2024-07-22: qty 90, 30d supply, fill #0

## 2024-07-22 MED ORDER — QUETIAPINE FUMARATE 50 MG PO TABS
ORAL_TABLET | ORAL | 5 refills | Status: DC
  Filled 2024-07-22 – 2024-08-09 (×2): qty 10, 8d supply, fill #0

## 2024-07-22 MED ORDER — DULOXETINE HCL 30 MG PO CPEP
90.0000 mg | ORAL_CAPSULE | Freq: Every day | ORAL | 1 refills | Status: DC
  Filled 2024-07-22 (×2): qty 90, 30d supply, fill #0

## 2024-07-22 MED ORDER — GABAPENTIN 300 MG PO CAPS
ORAL_CAPSULE | ORAL | 1 refills | Status: DC
  Filled 2024-07-22: qty 120, 30d supply, fill #0

## 2024-07-22 NOTE — Addendum Note (Signed)
 Addended by: CARVIN CROCK on: 07/22/2024 03:51 PM   Modules accepted: Level of Service

## 2024-07-22 NOTE — Patient Instructions (Signed)
 Gabapentin  for anxiety Cymbalta  for depression and anxiety Seroquel  is for mood stability and sleep Hydroxyzine  is for anxiety

## 2024-07-28 ENCOUNTER — Other Ambulatory Visit: Payer: Self-pay

## 2024-08-09 ENCOUNTER — Other Ambulatory Visit: Payer: Self-pay

## 2024-08-16 ENCOUNTER — Ambulatory Visit (INDEPENDENT_AMBULATORY_CARE_PROVIDER_SITE_OTHER): Payer: MEDICAID | Admitting: Psychiatry

## 2024-08-16 ENCOUNTER — Other Ambulatory Visit: Payer: Self-pay

## 2024-08-16 ENCOUNTER — Encounter (HOSPITAL_COMMUNITY): Payer: Self-pay | Admitting: Psychiatry

## 2024-08-16 VITALS — BP 110/70 | HR 70 | Temp 98.1°F | Ht 63.0 in | Wt 161.2 lb

## 2024-08-16 DIAGNOSIS — F411 Generalized anxiety disorder: Secondary | ICD-10-CM | POA: Diagnosis not present

## 2024-08-16 DIAGNOSIS — F1994 Other psychoactive substance use, unspecified with psychoactive substance-induced mood disorder: Secondary | ICD-10-CM | POA: Diagnosis not present

## 2024-08-16 DIAGNOSIS — R9431 Abnormal electrocardiogram [ECG] [EKG]: Secondary | ICD-10-CM | POA: Diagnosis not present

## 2024-08-16 MED ORDER — PROPRANOLOL HCL 20 MG PO TABS
20.0000 mg | ORAL_TABLET | Freq: Two times a day (BID) | ORAL | 3 refills | Status: AC | PRN
  Filled 2024-08-16: qty 60, 30d supply, fill #0
  Filled 2024-09-16 (×2): qty 60, 30d supply, fill #1
  Filled 2024-10-19 – 2024-10-21 (×2): qty 60, 30d supply, fill #2
  Filled 2024-10-26 – 2024-10-27 (×4): qty 60, 30d supply, fill #0

## 2024-08-16 MED ORDER — GABAPENTIN 300 MG PO CAPS
ORAL_CAPSULE | ORAL | 3 refills | Status: DC
  Filled 2024-08-16: qty 120, 30d supply, fill #0
  Filled 2024-09-14: qty 120, 30d supply, fill #1
  Filled 2024-10-19 – 2024-10-21 (×2): qty 120, 30d supply, fill #2

## 2024-08-16 MED ORDER — HYDROXYZINE PAMOATE 50 MG PO CAPS
50.0000 mg | ORAL_CAPSULE | Freq: Four times a day (QID) | ORAL | 3 refills | Status: AC | PRN
  Filled 2024-08-16: qty 180, 45d supply, fill #0
  Filled 2024-09-16 (×2): qty 120, 30d supply, fill #0
  Filled 2024-10-19 – 2024-10-21 (×2): qty 120, 30d supply, fill #1
  Filled 2024-10-26 – 2024-10-27 (×4): qty 120, 30d supply, fill #0

## 2024-08-16 MED ORDER — QUETIAPINE FUMARATE 100 MG PO TABS
100.0000 mg | ORAL_TABLET | Freq: Every day | ORAL | 3 refills | Status: AC
  Filled 2024-08-16: qty 30, 30d supply, fill #0
  Filled 2024-09-14: qty 30, 30d supply, fill #1
  Filled 2024-10-19: qty 30, 30d supply, fill #2
  Filled 2024-10-19 – 2024-10-27 (×8): qty 30, 30d supply, fill #0

## 2024-08-16 MED ORDER — DULOXETINE HCL 30 MG PO CPEP
90.0000 mg | ORAL_CAPSULE | Freq: Every day | ORAL | 3 refills | Status: AC
  Filled 2024-08-16 – 2024-09-14 (×2): qty 90, 30d supply, fill #0
  Filled 2024-10-19 – 2024-10-21 (×2): qty 90, 30d supply, fill #1
  Filled 2024-10-26 – 2024-10-27 (×4): qty 90, 30d supply, fill #0
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
## 2024-08-24 ENCOUNTER — Encounter (HOSPITAL_COMMUNITY): Payer: MEDICAID | Admitting: Psychiatry

## 2024-09-09 ENCOUNTER — Telehealth (INDEPENDENT_AMBULATORY_CARE_PROVIDER_SITE_OTHER): Payer: Self-pay | Admitting: Primary Care

## 2024-09-09 NOTE — Telephone Encounter (Signed)
 Copied from CRM #[id number]. Topic: Clinical - Request for Lab/Test Order >> Sep 09, 2024 12:01 PM Delon DASEN wrote: Reason for CRM: need to schedule mammogram- [phone]

## 2024-09-10 ENCOUNTER — Other Ambulatory Visit (INDEPENDENT_AMBULATORY_CARE_PROVIDER_SITE_OTHER): Payer: Self-pay

## 2024-09-10 ENCOUNTER — Encounter (INDEPENDENT_AMBULATORY_CARE_PROVIDER_SITE_OTHER): Payer: Self-pay

## 2024-09-10 DIAGNOSIS — Z1231 Encounter for screening mammogram for malignant neoplasm of breast: Secondary | ICD-10-CM

## 2024-09-10 NOTE — Telephone Encounter (Signed)
 Please have pt reach out to the [facility] to schedule mammogram

## 2024-09-13 NOTE — Telephone Encounter (Signed)
 Noted

## 2024-09-13 NOTE — Telephone Encounter (Signed)
 LVM for pt to reach out to [facility] for mammogram.

## 2024-09-14 ENCOUNTER — Other Ambulatory Visit: Payer: Self-pay

## 2024-09-15 ENCOUNTER — Other Ambulatory Visit: Payer: Self-pay

## 2024-09-16 ENCOUNTER — Telehealth (HOSPITAL_COMMUNITY): Payer: Self-pay | Admitting: Psychiatry

## 2024-09-16 ENCOUNTER — Other Ambulatory Visit: Payer: Self-pay

## 2024-09-16 ENCOUNTER — Ambulatory Visit: Payer: Self-pay

## 2024-09-16 ENCOUNTER — Encounter: Payer: Self-pay | Admitting: Neurology

## 2024-09-16 ENCOUNTER — Institutional Professional Consult (permissible substitution): Payer: Self-pay | Admitting: Neurology

## 2024-09-16 VITALS — BP 111/73 | HR 75 | Ht 63.0 in | Wt 163.0 lb

## 2024-09-16 DIAGNOSIS — R0681 Apnea, not elsewhere classified: Secondary | ICD-10-CM

## 2024-09-16 DIAGNOSIS — G4719 Other hypersomnia: Secondary | ICD-10-CM | POA: Diagnosis not present

## 2024-09-16 DIAGNOSIS — R351 Nocturia: Secondary | ICD-10-CM

## 2024-09-16 DIAGNOSIS — R6889 Other general symptoms and signs: Secondary | ICD-10-CM

## 2024-09-16 DIAGNOSIS — F17200 Nicotine dependence, unspecified, uncomplicated: Secondary | ICD-10-CM

## 2024-09-16 DIAGNOSIS — G4700 Insomnia, unspecified: Secondary | ICD-10-CM | POA: Diagnosis not present

## 2024-09-16 DIAGNOSIS — Z9189 Other specified personal risk factors, not elsewhere classified: Secondary | ICD-10-CM | POA: Diagnosis not present

## 2024-09-16 DIAGNOSIS — E663 Overweight: Secondary | ICD-10-CM | POA: Diagnosis not present

## 2024-09-16 DIAGNOSIS — R0683 Snoring: Secondary | ICD-10-CM

## 2024-09-16 NOTE — Patient Instructions (Signed)

## 2024-09-16 NOTE — Telephone Encounter (Signed)
 FYI Only or Action Required?: Action required by provider: clinical question for provider.: pt would like some medication for cough  Patient was last seen in primary care on 07/13/2024 by Theotis Haze ORN, NP.  Called Nurse Triage reporting Cough.  Symptoms began 1.5 weeks.  Interventions attempted: Nothing.  Symptoms are: gradually worsening.  Triage Disposition: See PCP When Office is Open (Within 3 Days)  Patient/caregiver understands and will follow disposition?: No, wishes to speak with PCP   Copied from CRM #[id number]. Topic: Clinical - Red Word Triage >> Sep 16, 2024 11:08 AM [name] wrote: Red Word that prompted transfer to Nurse Triage:  She is coughing up yellow mucus for about 1 1/2 weeks; also has chest congestion; stomach sore from coughing, no fever Reason for Disposition  Cough has been present for > 3 weeks  Answer Assessment - Initial Assessment Questions 1. ONSET: When did the cough begin?      1.5 weeks 2. SEVERITY: How bad is the cough today?       Moderate to severe 3. SPUTUM: Describe the color of your sputum (e.g., none, dry cough; clear, white, yellow, green)     yellow 4. HEMOPTYSIS: Are you coughing up any blood? If Yes, ask: How much? (e.g., flecks, streaks, tablespoons, etc.)     no 5. DIFFICULTY BREATHING: Are you having difficulty breathing? If Yes, ask: How bad is it? (e.g., mild, moderate, severe)      Mild at times due to coughing spell 6. FEVER: Do you have a fever? If Yes, ask: What is your temperature, how was it measured, and when did it start?     no 7. CARDIAC HISTORY: Do you have any history of heart disease? (e.g., heart attack, congestive heart failure)      na 8. LUNG HISTORY: Do you have any history of lung disease?  (e.g., pulmonary embolus, asthma, emphysema)     na 9. PE RISK FACTORS: Do you have a history of blood clots? (or: recent major surgery, recent prolonged travel, bedridden)     na 10. OTHER  SYMPTOMS: Do you have any other symptoms? (e.g., runny nose, wheezing, chest pain)       wheezing 11. PREGNANCY: Is there any chance you are pregnant? When was your last menstrual period?       na 12. TRAVEL: Have you traveled out of the country in the last month? (e.g., travel history, exposures)       Na  Pt states she does not feel bad - however has a really bad cough and would like a rx for some cough medication  Protocols used: Cough - Acute Productive-A-AH

## 2024-09-16 NOTE — Telephone Encounter (Signed)
 Patient called and said that she cannot get a cardiology appointment until 11/11/24. She is scheduled to see you on 10/26/24. Do you want her to cancel your appointment with you until after she sees the cardiologist? The best number to reach her is [phone].  Please advise. Thank you.

## 2024-09-16 NOTE — Progress Notes (Signed)
 Subjective:    Patient ID: Angel Hughes is a 63 y.o. female.  HPI    True Mar, MD, PhD [facility] [address] [address] [place], [place] [address]  Dear Drs. Izella and Comfort,   I saw your patient, Angel Hughes, upon your kind request in my sleep clinic today for initial consultation of her sleep disorder, in particular, concern for underlying obstructive sleep apnea.  The patient is unaccompanied today.  As you know, Angel Hughes is a 63 year old female with an underlying medical history of mood disorder, substance use disorder, reflux disease, hypertension, hyperlipidemia, allergic rhinitis, anemia, arthritis, smoking, and overweight state, who reports loud snoring and daytime somnolence as well as difficulty initiating and maintaining sleep for years.  She is on multiple medications including several psychotropic medications and takes Seroquel  at night.  She has been told that she has pauses in her breathing while asleep.  She lives with her ex-boyfriend.  They have no pets in the household.  She does not keep a schedule for her bedtime or rise time, she may wake up anywhere between 5 AM and 12:30 PM.  She may go to bed around 8 but does not actually have a set schedule.  She does have a TV on in her bedroom and it stays on all night.  She does not sleep without the TV.  She used to have a TV and a radio on in her bedroom.  She smokes about 15 cigarettes/day.  She smokes daily or nearly daily THC.  She has nocturia about 2-3 times per average night, denies nocturnal or recurrent morning headaches.  She drinks no daily caffeine.  She drinks alcohol occasionally, in the past it was more heavy use.  Weight has been fluctuating.  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  She is getting dental work done.  Her Past Medical History Is Significant For: Past Medical History:  Diagnosis Date   Allergic rhinitis    Anemia     Anxiety    Arthritis    Bipolar disorder (HCC)    Depression    GERD (gastroesophageal reflux disease)    Hyperlipemia    Hypertension    pt states that she has no hx of elevated BP, on med for hot flashes   Obesity     Her Past Surgical History Is Significant For: Past Surgical History:  Procedure Laterality Date   ABDOMINAL HYSTERECTOMY     CESAREAN SECTION     WRIST SURGERY     cyst   Z-PLASTY REPAIR      Her Family History Is Significant For: Family History  Problem Relation Age of Onset   Migraines Mother    Diabetes Mother    Hypertension Mother    Diabetes Sister    Heart disease Sister    Hypertension Sister    Stroke Brother    Diabetes Brother    Diabetes Maternal Grandmother    Hypertension Maternal Grandmother    Diabetes Paternal Grandmother    Hypertension Paternal Grandmother    Seizures Neg Hx    Sleep apnea Neg Hx     Her Social History Is Significant For: Social History   Socioeconomic History   Marital status: Widowed    Spouse name: Not on file   Number of children: Not on file   Years of education: Not on file   Highest education level: Not on file  Occupational History  Not on file  Tobacco Use   Smoking status: Every Day    Current packs/day: 0.50    Average packs/day: 0.5 packs/day for 34.0 years (17.0 ttl pk-yrs)    Types: Cigarettes   Smokeless tobacco: Never  Vaping Use   Vaping status: Never Used  Substance and Sexual Activity   Alcohol use: Yes    Alcohol/week: 0.0 standard drinks of alcohol    Comment: occassionally   Drug use: Yes    Types: Marijuana   Sexual activity: Not on file  Other Topics Concern   Not on file  Social History Narrative   Right handed    Lives alone    Social Drivers of Health   Tobacco Use: High Risk (09/16/2024)   Patient History    Smoking Tobacco Use: Every Day    Smokeless Tobacco Use: Never    Passive Exposure: Not on file  Financial Resource Strain: Not on file  Food  Insecurity: Food Insecurity Present (07/13/2024)   Epic    Worried About Programme Researcher, Broadcasting/film/video in the Last Year: Often true    Barista in the Last Year: Never true  Transportation Needs: Unmet Transportation Needs (07/13/2024)   Epic    Lack of Transportation (Medical): Yes    Lack of Transportation (Non-Medical): Yes  Physical Activity: Not on file  Stress: Not on file  Social Connections: Not on file  Depression (PHQ2-9): High Risk (08/16/2024)   Depression (PHQ2-9)    PHQ-2 Score: 23  Alcohol Screen: Not on file  Housing: High Risk (07/13/2024)   Epic    Unable to Pay for Housing in the Last Year: No    Number of Times Moved in the Last Year: Not on file    Homeless in the Last Year: Yes  Utilities: Not At Risk (07/13/2024)   Epic    Threatened with loss of utilities: No  Health Literacy: Not on file    Her Allergies Are:  Allergies[1]:   Her Current Medications Are:  Outpatient Encounter Medications as of 09/16/2024  Medication Sig   amLODipine  (NORVASC ) 10 MG tablet Take 1 tablet (10 mg total) by mouth daily.   cyclobenzaprine  (FLEXERIL ) 5 MG tablet Take 1 tablet (5 mg total) by mouth 3 (three) times daily as needed for muscle spasms.   DULoxetine  (CYMBALTA ) 30 MG capsule Take 3 capsules (90 mg total) by mouth daily.   estrogens , conjugated, (PREMARIN ) 0.3 MG tablet Take 1 tablet (0.3 mg total) by mouth daily. Take daily for 21 days then do not take for 7 days.   gabapentin  (NEURONTIN ) 300 MG capsule Take 1 capsule (300 mg total) by mouth in the morning AND 1 capsule (300 mg total) midday AND 2 capsules (600 mg total) at bedtime.   hydrOXYzine  (VISTARIL ) 50 MG capsule Take 1 capsule (50 mg total) by mouth 4 (four) times daily as needed.   metFORMIN  (GLUCOPHAGE -XR) 500 MG 24 hr tablet Take 1 tablet (500 mg total) by mouth daily with breakfast.   propranolol  (INDERAL ) 20 MG tablet Take 1 tablet (20 mg total) by mouth 2 (two) times daily as needed for  panic,agitation,sleep, and anxiety.   QUEtiapine  (SEROQUEL ) 100 MG tablet Take 1 tablet (100 mg total) by mouth at bedtime.   rosuvastatin  (CRESTOR ) 20 MG tablet Take 1 tablet (20 mg total) by mouth daily.   No facility-administered encounter medications on file as of 09/16/2024.  :   Review of Systems:  Out of a complete 14 point  review of systems, all are reviewed and negative with the exception of these symptoms as listed below:  Review of Systems  Objective:  Neurological Exam  Physical Exam Physical Examination:   Vitals:   09/16/24 1006  BP: 111/73  Pulse: 75    General Examination: The patient is a very pleasant 63 y.o. female in no acute distress. She appears well-developed and well-nourished and adequately groomed.   HEENT: Normocephalic, atraumatic, pupils are equal, round and reactive to light, extraocular tracking is good without limitation to gaze excursion or nystagmus noted. No photophobia.  Corrective eye glasses in place. Hearing is grossly intact. Face is symmetric with normal facial animation. Speech is clear without dysarthria. There is no hypophonia. There is no lip, neck/head, jaw or voice tremor. Neck is supple with full range of passive and active motion. There are no carotid bruits on auscultation.  Airway/Oropharynx exam reveals: mild mouth dryness, marginal dental hygiene and moderate airway crowding, due to small airway entry, larger tongue, tonsils about 1+, Mallampati class III.  Minimal overbite noted, possible gum disease noted as well.  Tongue protrudes centrally and palate elevates symmetrically.  Neck circumference 15 1/4 inches.  Chest: Clear to auscultation without wheezing, but she has several bouts of cough during the visit.  (She is wearing a face mask which she briefly removed for HEENT exam.  She endorses upper respiratory symptoms and is trying to get in touch with her doctor to get an appointment.)   Heart: S1+S2+0, regular and normal  without murmurs, rubs or gallops noted.   Abdomen: Soft, non-tender and non-distended.  Extremities: There is no pitting edema in the distal lower extremities bilaterally.   Skin: Warm and dry without trophic changes noted.   Musculoskeletal: exam reveals no obvious joint deformities.  Scar medial left ankle from prior injury.  Neurologically:  Mental status: The patient is awake, alert and oriented in all 4 spheres. Her immediate and remote memory, attention, language skills and fund of knowledge are appropriate. There is no evidence of aphasia, agnosia, apraxia or anomia. Speech is clear with normal prosody and enunciation. Thought process is linear. Mood is constricted and affect is blunted.  Cranial nerves II - XII are as described above under HEENT exam.  Motor exam: Normal bulk, strength and tone is noted. There is no obvious action or resting tremor.  Fine motor skills and coordination: Intact grossly.  Cerebellar testing: No dysmetria or intention tremor. There is no truncal or gait ataxia.  Sensory exam: intact to light touch in the upper and lower extremities.  Gait, station and balance: She stands easily. No veering to one side is noted. No leaning to one side is noted. Posture is age-appropriate and stance is narrow based. Gait shows normal stride length and normal pace. No problems turning are noted.   Assessment and Plan:   In summary, Angel Hughes is a 63 year old female with an underlying medical history of mood disorder, substance use disorder, reflux disease, hypertension, hyperlipidemia, allergic rhinitis, anemia, arthritis, smoking, and overweight state, whose history and physical exam are concerning for sleep disordered breathing, particularly obstructive sleep apnea (OSA). A laboratory attended sleep study is typically considered gold standard for evaluation of sleep disordered breathing.   I had a long chat with the patient about my findings and the diagnosis of sleep  apnea, particularly OSA, its prognosis and treatment options. We talked about medical/conservative treatments, surgical interventions and non-pharmacological approaches for symptom control. I explained, in particular, the risks and ramifications  of untreated moderate to severe OSA, especially with respect to developing cardiovascular disease down the road, including congestive heart failure (CHF), difficult to treat hypertension, cardiac arrhythmias (particularly A-fib), neurovascular complications including TIA, stroke and dementia. Even type 2 diabetes has, in part, been linked to untreated OSA. Symptoms of untreated OSA may include (but may not be limited to) daytime sleepiness, nocturia (i.e. frequent nighttime urination), memory problems, mood irritability and suboptimally controlled or worsening mood disorder such as depression and/or anxiety, lack of energy, lack of motivation, physical discomfort, as well as recurrent headaches, especially morning or nocturnal headaches. We talked about the importance of maintaining a healthy lifestyle and striving for healthy weight.  The importance of complete cigarette and THC smoking cessation was also addressed.  In addition, we talked about the importance of striving for and maintaining good sleep hygiene.  She endorses residual issues with depression and stress.  She is advised to get in touch with your office regarding further management.  She also reports that she has been referred to cardiology.  She has an appointment pending for early February 2025. I recommended a sleep study at this time. I outlined the differences between a laboratory attended sleep study which is considered more comprehensive and accurate over the option of a home sleep test (HST); the latter may lead to underestimation of sleep disordered breathing in some instances and does not help with diagnosing upper airway resistance syndrome and is not accurate enough to diagnose primary central  sleep apnea typically. I outlined possible surgical and non-surgical treatment options of OSA, including the use of a positive airway pressure (PAP) device (i.e. CPAP, AutoPAP/APAP or BiPAP in certain circumstances), a custom-made dental device (aka oral appliance, which would require a referral to a specialist dentist or orthodontist typically, and is generally speaking not considered for patients with full dentures or edentulous state), upper airway surgical options, such as traditional UPPP (which is not considered a first-line treatment) or the Inspire device (hypoglossal nerve stimulator, which would involve a referral for consultation with an ENT surgeon, after careful selection, following inclusion criteria - also not first-line treatment). I explained the PAP treatment option to the patient in detail, as this is generally considered first-line treatment.  The patient indicated that she would be willing to try PAP therapy, if the need arises. I explained the importance of being compliant with PAP treatment, not only for insurance purposes but primarily to improve patient's symptoms symptoms, and for the patient's long term health benefit, including to reduce Her cardiovascular risks longer-term.    We will pick up our discussion about the next steps and treatment options after testing.  We will keep her posted as to the test results by phone call and/or MyChart messaging where possible.  We will plan to follow-up in sleep clinic accordingly as well.  I answered all her questions today and the patient was in agreement.   I encouraged her to call with any interim questions, concerns, problems or updates or email us  through MyChart.  Generally speaking, sleep test authorizations may take up to 2 weeks, sometimes less, sometimes longer, the patient is encouraged to get in touch with us  if they do not hear back from the sleep lab staff directly within the next 2 weeks.  Thank you very much for allowing me  to participate in the care of this nice patient. If I can be of any further assistance to you please do not hesitate to call me at [phone].  Sincerely,  True Mar, MD, PhD     [1] No Known Allergies

## 2024-09-17 ENCOUNTER — Telehealth (INDEPENDENT_AMBULATORY_CARE_PROVIDER_SITE_OTHER): Payer: Self-pay

## 2024-09-17 ENCOUNTER — Other Ambulatory Visit: Payer: Self-pay

## 2024-09-17 ENCOUNTER — Other Ambulatory Visit (HOSPITAL_BASED_OUTPATIENT_CLINIC_OR_DEPARTMENT_OTHER): Payer: Self-pay

## 2024-09-17 NOTE — Telephone Encounter (Signed)
 Copied from CRM #[id number]. Topic: Clinical - Red Word Triage >> Sep 16, 2024 11:08 AM [name] wrote: Red Word that prompted transfer to Nurse Triage:  She is coughing up yellow mucus for about 1 1/2 weeks; also has chest congestion; stomach sore from coughing, no fever >> Sep 17, 2024  3:23 PM Shanda MATSU wrote: Patient calling back to check status on whether provider is going to call in a med for symptoms she has is having, adv patient that I do not see anything has been called in as of yet, adv patient that I will send another message to provider to see if this can be done.

## 2024-09-17 NOTE — Telephone Encounter (Signed)
 Tried reaching out to pt pt didn't answer lvm  Informed pt I was calling because I received a message in regards to coughing up yellow mucus and congestion. Made pt aware that because it's late in the afternoon she can be seen at the urgent care if her symptoms gets worse or we can schedule her an acute visit this week with provider because the provider will want to see her and if she has any questions or concerns to give us  a call

## 2024-09-20 ENCOUNTER — Telehealth: Payer: Self-pay

## 2024-09-20 NOTE — Telephone Encounter (Signed)
 Noted seen from 09/17/2024 patient was advised to go to [facility]

## 2024-09-20 NOTE — Telephone Encounter (Signed)
 Provider attempted to call patient without success. Provider would like to see patient after her cardiology appointment if possible.

## 2024-09-22 ENCOUNTER — Telehealth: Payer: Self-pay | Admitting: Neurology

## 2024-09-22 NOTE — Telephone Encounter (Signed)
 NPSG MCD [place] pending

## 2024-10-04 NOTE — Telephone Encounter (Signed)
 NPSG MCD [place] b[place]: [phone] (exp. 09/22/24 to 12/10/24)

## 2024-10-11 ENCOUNTER — Ambulatory Visit
Admission: RE | Admit: 2024-10-11 | Discharge: 2024-10-11 | Disposition: A | Payer: MEDICAID | Source: Ambulatory Visit | Attending: Primary Care | Admitting: Primary Care

## 2024-10-11 DIAGNOSIS — Z1231 Encounter for screening mammogram for malignant neoplasm of breast: Secondary | ICD-10-CM

## 2024-10-11 NOTE — Telephone Encounter (Signed)
 Burnard spoke with the pat on 10/07/2024 the patient stated she will call back later

## 2024-10-15 ENCOUNTER — Telehealth (INDEPENDENT_AMBULATORY_CARE_PROVIDER_SITE_OTHER): Payer: Self-pay | Admitting: Primary Care

## 2024-10-15 NOTE — Telephone Encounter (Signed)
Pt will be present.

## 2024-10-18 ENCOUNTER — Ambulatory Visit (INDEPENDENT_AMBULATORY_CARE_PROVIDER_SITE_OTHER): Payer: MEDICAID | Admitting: Primary Care

## 2024-10-18 ENCOUNTER — Other Ambulatory Visit: Payer: Self-pay

## 2024-10-18 ENCOUNTER — Encounter (INDEPENDENT_AMBULATORY_CARE_PROVIDER_SITE_OTHER): Payer: Self-pay | Admitting: Primary Care

## 2024-10-18 ENCOUNTER — Other Ambulatory Visit (HOSPITAL_COMMUNITY): Payer: Self-pay

## 2024-10-18 VITALS — BP 120/76 | HR 60 | Resp 16 | Ht 62.0 in | Wt 161.0 lb

## 2024-10-18 DIAGNOSIS — J322 Chronic ethmoidal sinusitis: Secondary | ICD-10-CM | POA: Diagnosis not present

## 2024-10-18 DIAGNOSIS — E0840 Diabetes mellitus due to underlying condition with diabetic neuropathy, unspecified: Secondary | ICD-10-CM

## 2024-10-18 DIAGNOSIS — E782 Mixed hyperlipidemia: Secondary | ICD-10-CM

## 2024-10-18 DIAGNOSIS — E559 Vitamin D deficiency, unspecified: Secondary | ICD-10-CM | POA: Diagnosis not present

## 2024-10-18 DIAGNOSIS — N951 Menopausal and female climacteric states: Secondary | ICD-10-CM

## 2024-10-18 DIAGNOSIS — E119 Type 2 diabetes mellitus without complications: Secondary | ICD-10-CM | POA: Diagnosis not present

## 2024-10-18 DIAGNOSIS — Z1211 Encounter for screening for malignant neoplasm of colon: Secondary | ICD-10-CM

## 2024-10-18 DIAGNOSIS — Z23 Encounter for immunization: Secondary | ICD-10-CM

## 2024-10-18 MED ORDER — CETIRIZINE-PSEUDOEPHEDRINE ER 5-120 MG PO TB12
1.0000 | ORAL_TABLET | Freq: Two times a day (BID) | ORAL | 1 refills | Status: AC
  Filled 2024-10-18: qty 96, 48d supply, fill #0

## 2024-10-18 MED ORDER — ESTROGENS CONJUGATED 0.45 MG PO TABS
0.4500 mg | ORAL_TABLET | Freq: Every day | ORAL | 3 refills | Status: AC
  Filled 2024-10-18 – 2024-10-26 (×4): qty 21, 21d supply, fill #0
  Filled 2024-10-27: qty 30, 30d supply, fill #0

## 2024-10-18 MED ORDER — FLUTICASONE PROPIONATE 50 MCG/ACT NA SUSP
2.0000 | Freq: Every day | NASAL | 6 refills | Status: AC
  Filled 2024-10-18 – 2024-10-27 (×7): qty 16, 30d supply, fill #0

## 2024-10-18 NOTE — Progress Notes (Signed)
 All her blood [facility]  Daly Whipkey, is a 64 y.o. female  RDW:[id number]  FMW:[id number]  DOB - [birth year]  Chief Complaint  Patient presents with   Diabetes   Referral    Podiatry    Nausea    Requesting something for nausea   Headache       Subjective:   Angel Hughes is a 64 y.o. female here today for an acute visit.  She complains of having a headache for the last 3 to 4 days location is parietal area does not radiate anywhere. 6/10 pain Aggravating factors recently had an anxiety attack she feels is her contributing factor. Treatment laid in the bed until resolved drinking Gatorade and water for hydration.  Aggravating factor is nausea with anxiety causing decreased appetite and unable to eat properly.   Diabetes Hypoglycemia symptoms include headaches.  Headache     No problems updated.  Comprehensive ROS Pertinent positive and negative noted in HPI   Allergies[1]  Past Medical History:  Diagnosis Date   Allergic rhinitis    Anemia    Anxiety    Arthritis    Bipolar disorder (HCC)    Depression    GERD (gastroesophageal reflux disease)    Hyperlipemia    Hypertension    pt states that she has no hx of elevated BP, on med for hot flashes   Obesity     Medications Ordered Prior to Encounter[2] Health Maintenance  Topic Date Due   Pneumococcal Vaccine for age over 92 (1 of 2 - PCV) Never done   Zoster (Shingles) Vaccine (1 of 2) Never done   COVID-19 Vaccine (3 - Pfizer risk series) 07/13/2020   Colon Cancer Screening  07/18/2020   Kidney health urinalysis for diabetes  10/17/2024   Pap with HPV screening  11/16/2024   Yearly kidney function blood test for diabetes  07/13/2025   Breast Cancer Screening  10/11/2026   DTaP/Tdap/Td vaccine (2 - Td or Tdap) 11/09/2029   Flu Shot  Completed   HPV Vaccine (No Doses Required) Completed   Hepatitis C Screening  Completed   HIV Screening  Completed   Hepatitis B Vaccine  Aged Out    Meningitis B Vaccine  Aged Out    Objective:   Vitals:   10/18/24 1056  BP: 120/76  Pulse: 60  Resp: 16  SpO2: 100%  Weight: 161 lb (73 kg)  Height: 5' 2 (1.575 m)     Physical Exam Vitals reviewed.  Constitutional:      Appearance: Normal appearance. She is well-developed.     Comments: Overweight  HENT:     Head: Normocephalic.     Right Ear: Tympanic membrane, ear canal and external ear normal.     Left Ear: Tympanic membrane, ear canal and external ear normal.     Nose: Nose normal.     Mouth/Throat:     Mouth: Mucous membranes are moist.  Eyes:     Extraocular Movements: Extraocular movements intact.     Pupils: Pupils are equal, round, and reactive to light.  Cardiovascular:     Rate and Rhythm: Normal rate and regular rhythm.  Pulmonary:     Effort: Pulmonary effort is normal.     Breath sounds: Normal breath sounds.  Abdominal:     General: Bowel sounds are normal.     Palpations: Abdomen is soft.  Musculoskeletal:        General: Normal range of motion.     Cervical back:  Normal range of motion.  Skin:    General: Skin is warm and dry.  Neurological:     Mental Status: She is alert and oriented to person, place, and time.  Psychiatric:        Mood and Affect: Mood normal.        Behavior: Behavior normal.        Thought Content: Thought content normal.    Assessment & Plan  Angel Hughes was seen today for diabetes, referral, nausea and headache.  Diagnoses and all orders for this visit:  Screening for colon cancer Patient has been tried to be reached for colonoscopy.  Information placed on AVS for her to call and schedule appointment.  Type 2 diabetes mellitus without complication, without long-term current use of insulin (HCC) A1c ranges in prediabetic state 5.9.  She is prediabetic .  Prediabetes is 5.7-6.4 monitor carbohydrates -rice, potatoes, tortillas, [place] Corn Masa Flour, breads, pasta, sweets, sodas.  Increase exercising to help maintain  appropriate weight.   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 Take daily for 21 days then do not take for 7 days.    Other orders 2/2 Chronic ethmoidal sinusitis -     fluticasone  (FLONASE ) 50 MCG/ACT nasal spray; Place 2 sprays into both nostrils daily. -     cetirizine -pseudoephedrine  (ZYRTEC -D ALLERGY & CONGESTION) 5-120 MG tablet; Take 1 tablet by mouth 2 (two) times daily.    Patient have been counseled extensively about nutrition and exercise. Other issues discussed during this visit include: low cholesterol diet, weight control and daily exercise, foot care, annual eye examinations at Ophthalmology, importance of adherence with medications and regular follow-up. We also discussed long term complications of uncontrolled diabetes and hypertension.   Return in about 3 months (around 01/16/2025) for fasting labs.  The patient was given clear instructions to go to ER or return to medical center if symptoms don't improve, worsen or new problems develop. The patient verbalized understanding. The patient was told to call to get lab results if they haven't heard anything in the next week.   This note has been created with Education officer, environmental. Any transcriptional errors are unintentional.   Angel SHAUNNA Bohr, NP 10/18/2024, 11:26 AM    [1] No Known Allergies [2]  Current Outpatient Medications on File Prior to Visit  Medication Sig Dispense Refill   amLODipine  (NORVASC ) 10 MG tablet Take 1  tablet (10 mg total) by mouth daily. 90 tablet 1   cyclobenzaprine  (FLEXERIL ) 5 MG tablet Take 1 tablet (5 mg total) by mouth 3 (three) times daily as needed for muscle spasms. 30 tablet 1   DULoxetine  (CYMBALTA ) 30 MG capsule Take 3 capsules (90 mg total) by mouth daily. 90 capsule 3   estrogens , conjugated, (PREMARIN ) 0.3 MG tablet Take 1 tablet (0.3 mg total) by mouth daily. Take daily for 21 days then do not take for 7 days. 21 tablet 2   gabapentin  (NEURONTIN ) 300 MG capsule Take 1 capsule (300 mg total) by mouth in the morning AND  1 capsule (300 mg total) midday AND 2 capsules (600 mg total) at bedtime. 120 capsule 3   hydrOXYzine  (VISTARIL ) 50 MG capsule Take 1 capsule (50 mg total) by mouth 4 (four) times daily as needed. 180 capsule 3   metFORMIN  (GLUCOPHAGE -XR) 500 MG 24 hr tablet Take 1 tablet (500 mg total) by mouth daily with breakfast. 90 tablet 1   propranolol  (INDERAL ) 20 MG tablet Take 1 tablet (20 mg total) by mouth 2 (two) times daily as needed for panic,agitation,sleep, and anxiety. 60 tablet 3   QUEtiapine  (SEROQUEL ) 100 MG tablet Take 1 tablet (100 mg total) by mouth at bedtime. 30 tablet 3   rosuvastatin  (CRESTOR ) 20 MG tablet Take 1 tablet (20 mg total) by mouth daily. 90 tablet 3   No current facility-administered medications on file prior to visit.

## 2024-10-18 NOTE — Patient Instructions (Addendum)
 Placed in [facility] [address] [place], [place] [address] PH# [phone]  Call to reschedule colonscopy    Pneumococcal Conjugate Vaccine: What You Need to Know (CDC VIS) Many Vaccine Information Statements are available in Spanish and other languages. See [email or web address] To view this CDC Vaccine Information Statement in English, click the link below or scan the QR code: https://pe.elsevier.com/pzqVXTMF  This information is not intended to replace advice given to you by your health care provider. Make sure you discuss any questions you have with your health care provider. Vaccine Information Statements (VISs) are third - party content from [facility] & Immunize.org and Elsevier is not responsible for the content.     Immunize.org Market researcher.     Elsevier Patient Education  [employer].

## 2024-10-19 ENCOUNTER — Other Ambulatory Visit: Payer: Self-pay

## 2024-10-19 ENCOUNTER — Telehealth (HOSPITAL_COMMUNITY): Payer: Self-pay

## 2024-10-19 ENCOUNTER — Other Ambulatory Visit (HOSPITAL_COMMUNITY): Payer: Self-pay

## 2024-10-19 LAB — LIPID PANEL
Chol/HDL Ratio: 3.3 ratio (ref 0.0–4.4)
Cholesterol, Total: 195 mg/dL (ref 100–199)
HDL: 60 mg/dL
LDL Chol Calc (NIH): 120 mg/dL — ABNORMAL HIGH (ref 0–99)
Triglycerides: 83 mg/dL (ref 0–149)
VLDL Cholesterol Cal: 15 mg/dL (ref 5–40)

## 2024-10-19 LAB — MICROALBUMIN / CREATININE URINE RATIO
Creatinine, Urine: 161 mg/dL
Microalb/Creat Ratio: 7 mg/g{creat} (ref 0–29)
Microalbumin, Urine: 10.9 ug/mL

## 2024-10-19 LAB — VITAMIN D 25 HYDROXY (VIT D DEFICIENCY, FRACTURES): Vit D, 25-Hydroxy: 7.2 ng/mL — ABNORMAL LOW (ref 30.0–100.0)

## 2024-10-20 ENCOUNTER — Other Ambulatory Visit: Payer: Self-pay

## 2024-10-20 ENCOUNTER — Other Ambulatory Visit (HOSPITAL_BASED_OUTPATIENT_CLINIC_OR_DEPARTMENT_OTHER): Payer: Self-pay

## 2024-10-21 ENCOUNTER — Other Ambulatory Visit (HOSPITAL_COMMUNITY): Payer: Self-pay

## 2024-10-21 ENCOUNTER — Other Ambulatory Visit: Payer: Self-pay

## 2024-10-22 ENCOUNTER — Ambulatory Visit (INDEPENDENT_AMBULATORY_CARE_PROVIDER_SITE_OTHER): Payer: Self-pay | Admitting: Primary Care

## 2024-10-22 ENCOUNTER — Encounter: Payer: Self-pay | Admitting: Podiatry

## 2024-10-22 ENCOUNTER — Other Ambulatory Visit: Payer: Self-pay

## 2024-10-22 ENCOUNTER — Other Ambulatory Visit (INDEPENDENT_AMBULATORY_CARE_PROVIDER_SITE_OTHER): Payer: Self-pay | Admitting: Primary Care

## 2024-10-22 ENCOUNTER — Ambulatory Visit (INDEPENDENT_AMBULATORY_CARE_PROVIDER_SITE_OTHER): Payer: MEDICAID | Admitting: Podiatry

## 2024-10-22 ENCOUNTER — Other Ambulatory Visit (HOSPITAL_COMMUNITY): Payer: Self-pay

## 2024-10-22 DIAGNOSIS — E0842 Diabetes mellitus due to underlying condition with diabetic polyneuropathy: Secondary | ICD-10-CM

## 2024-10-22 DIAGNOSIS — G5793 Unspecified mononeuropathy of bilateral lower limbs: Secondary | ICD-10-CM | POA: Diagnosis not present

## 2024-10-22 DIAGNOSIS — E559 Vitamin D deficiency, unspecified: Secondary | ICD-10-CM

## 2024-10-22 MED ORDER — PREGABALIN 50 MG PO CAPS
50.0000 mg | ORAL_CAPSULE | Freq: Three times a day (TID) | ORAL | 0 refills | Status: AC
  Filled 2024-10-22 (×4): qty 42, 14d supply, fill #0

## 2024-10-22 MED ORDER — ERGOCALCIFEROL 1.25 MG (50000 UT) PO CAPS
50000.0000 [IU] | ORAL_CAPSULE | ORAL | 0 refills | Status: AC
  Filled 2024-10-22 – 2024-10-27 (×5): qty 10, 70d supply, fill #0

## 2024-10-22 MED ORDER — PREGABALIN 100 MG PO CAPS
100.0000 mg | ORAL_CAPSULE | Freq: Three times a day (TID) | ORAL | 3 refills | Status: AC
  Filled 2024-10-22 (×2): qty 90, 30d supply, fill #0

## 2024-10-22 NOTE — Progress Notes (Signed)
 Patient presents for check for neuropathy for diabetes.  She has been taking gabapentin  300 mg 3 times daily and 300 mg p.o. at night.  Says she has not really gotten any improvement with neuropathy symptoms.  Gets burning and tingling in the feet.  She is a type II diabetic.  She was diagnosed with diabetes a little over a year ago.  Has never noticed any redness or cyanosis in the feet.  No claudication symptoms.   Physical exam:  General appearance: Pleasant, and in no acute distress. AOx3.  Vascular: Pedal pulses: DP 2/4 bilaterally, PT 2/4 bilaterally.  Mild edema lower legs bilaterally. Capillary fill time immediate bilaterally.  Neurological: Light touch intact feet bilaterally.  Normal Achilles reflex bilaterally.  No clonus or spasticity noted.  Slightly diminished vibratory sensation in feet bilaterally.  Monofilament sensation intact feet bilaterally.  Sharp sensation intact feet bilaterally.  Dermatologic:   Skin normal temperature bilaterally.  Skin normal color, tone, and texture bilaterally.   Musculoskeletal: Some tenderness along the arch of the foot bilaterally.  Tenderness along the medial arch of plantar fascia.  Normal muscle strength lower extremity bilaterally   Diagnosis: 1.  Neuritis feet bilaterally. 2.  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  Plan: -Establish office visit for evaluation and management level 3. - Discussed with her the neuropathy.  Given that she has been after 8 months she still has not gotten a good response with the gabapentin , we will switch her to Lyrica .  Told her we will keep try this for 3 months and see how she does.  Just neuropathy and etiology and treatment.  Discussed with her precautions and needs to take as a diabetic regarding her feet.  Discussed with her things to look out for the left foot and infection or ulceration. -Discontinue gabapentin  -Rx Lyrica  50 mg, 1 p.o. 3 times daily for 2 weeks. - Rx Lyrica  100 mg 1  p.o. 3 times daily, 3 refills  Return 3 months follow-up neuropathy

## 2024-10-25 ENCOUNTER — Other Ambulatory Visit (HOSPITAL_COMMUNITY): Payer: Self-pay

## 2024-10-25 ENCOUNTER — Other Ambulatory Visit: Payer: Self-pay

## 2024-10-26 ENCOUNTER — Other Ambulatory Visit (HOSPITAL_COMMUNITY): Payer: Self-pay

## 2024-10-26 ENCOUNTER — Encounter (HOSPITAL_COMMUNITY): Payer: MEDICAID | Admitting: Psychiatry

## 2024-10-26 ENCOUNTER — Other Ambulatory Visit: Payer: Self-pay

## 2024-10-27 ENCOUNTER — Other Ambulatory Visit: Payer: Self-pay

## 2024-10-27 ENCOUNTER — Other Ambulatory Visit (HOSPITAL_COMMUNITY): Payer: Self-pay

## 2024-10-28 ENCOUNTER — Other Ambulatory Visit: Payer: Self-pay

## 2024-10-28 ENCOUNTER — Other Ambulatory Visit (HOSPITAL_COMMUNITY): Payer: Self-pay

## 2024-10-29 ENCOUNTER — Other Ambulatory Visit: Payer: Self-pay

## 2024-11-11 ENCOUNTER — Ambulatory Visit: Payer: MEDICAID | Admitting: Cardiology

## 2024-11-15 ENCOUNTER — Encounter (HOSPITAL_COMMUNITY): Payer: MEDICAID | Admitting: Psychiatry

## 2025-01-18 ENCOUNTER — Ambulatory Visit (INDEPENDENT_AMBULATORY_CARE_PROVIDER_SITE_OTHER): Payer: Self-pay | Admitting: Primary Care

## 2025-01-19 ENCOUNTER — Ambulatory Visit: Payer: MEDICAID | Admitting: Podiatry
